# Patient Record
Sex: Female | Born: 1935 | Race: Black or African American | Hispanic: No | Smoking: Former smoker
Health system: Southern US, Community
[De-identification: ages and names within clinical notes are randomized; demographics above are authoritative.]

## PROBLEM LIST (undated history)

## (undated) DIAGNOSIS — E039 Hypothyroidism, unspecified: Secondary | ICD-10-CM

## (undated) DIAGNOSIS — I469 Cardiac arrest, cause unspecified: Secondary | ICD-10-CM

## (undated) DIAGNOSIS — I509 Heart failure, unspecified: Secondary | ICD-10-CM

## (undated) DIAGNOSIS — I219 Acute myocardial infarction, unspecified: Secondary | ICD-10-CM

## (undated) DIAGNOSIS — E785 Hyperlipidemia, unspecified: Secondary | ICD-10-CM

## (undated) DIAGNOSIS — K219 Gastro-esophageal reflux disease without esophagitis: Secondary | ICD-10-CM

## (undated) DIAGNOSIS — I4891 Unspecified atrial fibrillation: Secondary | ICD-10-CM

## (undated) DIAGNOSIS — N289 Disorder of kidney and ureter, unspecified: Secondary | ICD-10-CM

## (undated) DIAGNOSIS — I272 Pulmonary hypertension, unspecified: Secondary | ICD-10-CM

## (undated) DIAGNOSIS — J449 Chronic obstructive pulmonary disease, unspecified: Secondary | ICD-10-CM

## (undated) DIAGNOSIS — I10 Essential (primary) hypertension: Secondary | ICD-10-CM

## (undated) DIAGNOSIS — Z8541 Personal history of malignant neoplasm of cervix uteri: Secondary | ICD-10-CM

## (undated) DIAGNOSIS — N1330 Unspecified hydronephrosis: Secondary | ICD-10-CM

## (undated) DIAGNOSIS — K529 Noninfective gastroenteritis and colitis, unspecified: Secondary | ICD-10-CM

## (undated) DIAGNOSIS — I739 Peripheral vascular disease, unspecified: Secondary | ICD-10-CM

## (undated) HISTORY — DX: Hypothyroidism, unspecified: E03.9

## (undated) HISTORY — DX: Gastro-esophageal reflux disease without esophagitis: K21.9

## (undated) HISTORY — DX: Pulmonary hypertension, unspecified: I27.20

## (undated) HISTORY — DX: Noninfective gastroenteritis and colitis, unspecified: K52.9

## (undated) HISTORY — PX: OTHER SURGICAL HISTORY: SHX169

## (undated) HISTORY — DX: Hyperlipidemia, unspecified: E78.5

## (undated) HISTORY — PX: PERIPHERALLY INSERTED CENTRAL CATHETER INSERTION: SHX2221

## (undated) HISTORY — DX: Cardiac arrest, cause unspecified: I46.9

## (undated) HISTORY — DX: Personal history of malignant neoplasm of cervix uteri: Z85.41

## (undated) HISTORY — DX: Acute myocardial infarction, unspecified: I21.9

## (undated) HISTORY — DX: Peripheral vascular disease, unspecified: I73.9

## (undated) HISTORY — DX: Unspecified hydronephrosis: N13.30

---

## 2005-02-13 ENCOUNTER — Ambulatory Visit: Payer: Self-pay | Admitting: Cardiology

## 2008-12-06 ENCOUNTER — Ambulatory Visit: Payer: Self-pay | Admitting: Surgery

## 2008-12-27 ENCOUNTER — Ambulatory Visit: Payer: Self-pay | Admitting: Surgery

## 2009-01-04 ENCOUNTER — Ambulatory Visit (HOSPITAL_COMMUNITY): Admission: RE | Admit: 2009-01-04 | Discharge: 2009-01-04 | Payer: Self-pay | Admitting: Surgery

## 2009-01-04 ENCOUNTER — Ambulatory Visit: Payer: Self-pay | Admitting: Surgery

## 2009-01-17 ENCOUNTER — Ambulatory Visit: Payer: Self-pay | Admitting: Surgery

## 2009-01-17 ENCOUNTER — Encounter: Admission: RE | Admit: 2009-01-17 | Discharge: 2009-01-17 | Payer: Self-pay | Admitting: Surgery

## 2009-02-04 ENCOUNTER — Ambulatory Visit: Payer: Self-pay | Admitting: Surgery

## 2009-02-04 ENCOUNTER — Inpatient Hospital Stay (HOSPITAL_COMMUNITY): Admission: RE | Admit: 2009-02-04 | Discharge: 2009-02-11 | Payer: Self-pay | Admitting: Surgery

## 2009-02-04 HISTORY — PX: PR VEIN BYPASS GRAFT,AORTO-FEM-POP: 35551

## 2009-03-21 ENCOUNTER — Ambulatory Visit: Payer: Self-pay | Admitting: Surgery

## 2009-05-09 ENCOUNTER — Ambulatory Visit: Payer: Self-pay | Admitting: Surgery

## 2010-07-20 LAB — GLUCOSE, CAPILLARY
Glucose-Capillary: 106 mg/dL — ABNORMAL HIGH (ref 70–99)
Glucose-Capillary: 111 mg/dL — ABNORMAL HIGH (ref 70–99)
Glucose-Capillary: 112 mg/dL — ABNORMAL HIGH (ref 70–99)
Glucose-Capillary: 115 mg/dL — ABNORMAL HIGH (ref 70–99)
Glucose-Capillary: 121 mg/dL — ABNORMAL HIGH (ref 70–99)
Glucose-Capillary: 132 mg/dL — ABNORMAL HIGH (ref 70–99)
Glucose-Capillary: 239 mg/dL — ABNORMAL HIGH (ref 70–99)
Glucose-Capillary: 89 mg/dL (ref 70–99)

## 2010-07-20 LAB — COMPREHENSIVE METABOLIC PANEL
AST: 28 U/L (ref 0–37)
Albumin: 2.4 g/dL — ABNORMAL LOW (ref 3.5–5.2)
Albumin: 3.9 g/dL (ref 3.5–5.2)
Alkaline Phosphatase: 60 U/L (ref 39–117)
Chloride: 105 mEq/L (ref 96–112)
Chloride: 110 mEq/L (ref 96–112)
Creatinine, Ser: 1.04 mg/dL (ref 0.4–1.2)
GFR calc Af Amer: >60 mL/min (ref >60)
GFR calc non Af Amer: 52 mL/min — ABNORMAL LOW (ref >60)
Potassium: 4.3 mEq/L (ref 3.5–5.1)
Total Bilirubin: 0.3 mg/dL (ref 0.3–1.2)
Total Protein: 4.8 g/dL — ABNORMAL LOW (ref 6.0–8.3)
Total Protein: 7.4 g/dL (ref 6.0–8.3)

## 2010-07-20 LAB — BLOOD GAS, ARTERIAL
Acid-base deficit: 5 mmol/L — ABNORMAL HIGH (ref 0.0–2.0)
Drawn by: 275531
FIO2: 0.21 %
O2 Saturation: 97.9 %
O2 Saturation: 98.3 %
Patient temperature: 98.6
Patient temperature: 98.6
TCO2: 20.3 mmol/L (ref 0–100)
TCO2: 24.3 mmol/L (ref 0–100)
pCO2 arterial: 57.6 mmHg (ref 35.0–45.0)
pH, Arterial: 7.216 — ABNORMAL LOW (ref 7.350–7.400)
pH, Arterial: 7.377 (ref 7.350–7.400)
pO2, Arterial: 124 mmHg — ABNORMAL HIGH (ref 80.0–100.0)

## 2010-07-20 LAB — TYPE AND SCREEN
ABO/RH(D): AB POS
Antibody Screen: NEGATIVE

## 2010-07-20 LAB — BASIC METABOLIC PANEL
BUN: 6 mg/dL (ref 6–23)
Calcium: 7.9 mg/dL — ABNORMAL LOW (ref 8.4–10.5)
Chloride: 104 mEq/L (ref 96–112)
Chloride: 99 mEq/L (ref 96–112)
Creatinine, Ser: 0.85 mg/dL (ref 0.4–1.2)
Creatinine, Ser: 0.86 mg/dL (ref 0.4–1.2)
GFR calc Af Amer: >60 mL/min (ref >60)
GFR calc Af Amer: >60 mL/min (ref >60)
GFR calc non Af Amer: >60 mL/min (ref >60)
Glucose, Bld: 132 mg/dL — ABNORMAL HIGH (ref 70–99)
Potassium: 3.4 mEq/L — ABNORMAL LOW (ref 3.5–5.1)
Potassium: 3.6 mEq/L (ref 3.5–5.1)
Potassium: 4 mEq/L (ref 3.5–5.1)
Sodium: 127 mEq/L — ABNORMAL LOW (ref 135–145)
Sodium: 138 mEq/L (ref 135–145)

## 2010-07-20 LAB — CBC
HCT: 25.7 % — ABNORMAL LOW (ref 36.0–46.0)
HCT: 27.5 % — ABNORMAL LOW (ref 36.0–46.0)
HCT: 27.7 % — ABNORMAL LOW (ref 36.0–46.0)
Hemoglobin: 14.4 g/dL (ref 12.0–15.0)
Hemoglobin: 8.9 g/dL — ABNORMAL LOW (ref 12.0–15.0)
Hemoglobin: 9.6 g/dL — ABNORMAL LOW (ref 12.0–15.0)
MCHC: 34.5 g/dL (ref 30.0–36.0)
MCV: 94.9 fL (ref 78.0–100.0)
MCV: 95.3 fL (ref 78.0–100.0)
MCV: 96.1 fL (ref 78.0–100.0)
Platelets: 177 10*3/uL (ref 150–400)
Platelets: 187 10*3/uL (ref 150–400)
Platelets: 187 10*3/uL (ref 150–400)
Platelets: 251 10*3/uL (ref 150–400)
RBC: 2.69 MIL/uL — ABNORMAL LOW (ref 3.87–5.11)
RBC: 2.84 MIL/uL — ABNORMAL LOW (ref 3.87–5.11)
RDW: 14.4 % (ref 11.5–15.5)
WBC: 11.3 10*3/uL — ABNORMAL HIGH (ref 4.0–10.5)
WBC: 6.9 10*3/uL (ref 4.0–10.5)
WBC: 9.4 10*3/uL (ref 4.0–10.5)

## 2010-07-20 LAB — URINALYSIS, ROUTINE W REFLEX MICROSCOPIC
Glucose, UA: NEGATIVE mg/dL
Hgb urine dipstick: NEGATIVE
Nitrite: NEGATIVE
Protein, ur: NEGATIVE mg/dL
Urobilinogen, UA: 0.2 mg/dL (ref 0.0–1.0)
pH: 5.5 (ref 5.0–8.0)

## 2010-07-20 LAB — MAGNESIUM: Magnesium: 1.2 mg/dL — ABNORMAL LOW (ref 1.5–2.5)

## 2010-07-20 LAB — URINE MICROSCOPIC-ADD ON

## 2010-07-20 LAB — PROTIME-INR: INR: 1.06 (ref 0.00–1.49)

## 2010-07-20 LAB — MRSA PCR SCREENING: MRSA by PCR: NEGATIVE

## 2010-07-21 LAB — POCT I-STAT, CHEM 8
BUN: 17 mg/dL (ref 6–23)
Chloride: 110 mEq/L (ref 96–112)
Creatinine, Ser: 0.9 mg/dL (ref 0.4–1.2)
Glucose, Bld: 159 mg/dL — ABNORMAL HIGH (ref 70–99)
Hemoglobin: 14.3 g/dL (ref 12.0–15.0)
Potassium: 3.6 mEq/L (ref 3.5–5.1)
Sodium: 141 mEq/L (ref 135–145)

## 2010-08-29 NOTE — Assessment & Plan Note (Signed)
OFFICE VISIT   CARMALITA, WAKEFIELD  DOB:  1933/03/11                                       05/09/2009  ZOXWR#:[id number]   The patient returns today having undergone aortobifemoral bypass graft  on 02/04/2009.  This was done for lifestyle-limiting claudication.  Her  claudication has since resolved.  However, she does complain of some  burning in her right thigh and pain at the superior and inferior aspects  of her incision.  She is walking without difficulty.  She has normal GI  function and normal appetite.   PHYSICAL EXAMINATION:  Blood pressure is 154/87, heart rate 92,  temperature is 97.6.  General:  She is well-appearing, in no distress.  HEENT:  Normal.  Cardiovascular:  Regular rate and rhythm.  Abdomen:  Soft, nontender.  She has a well-healed abdominal incision.  No hernias  are appreciated.  She has palpable pedal pulses,   ASSESSMENT:  Status post aortobifemoral bypass graft.   PLAN:  I think most of the patient's symptoms are related to typical  postoperative complaints, most likely attributed to scar tissue.  I told  her that I would expect her symptoms to resolve within the next 3-6  months.  If she has any further questions or concerns, she will contact  me.  Otherwise I will see her at her regularly scheduled appointment.     Jorge Ny, MD  Electronically Signed   VWB/MEDQ  D:  05/09/2009  T:  05/10/2009  Job:  [id number]

## 2010-08-29 NOTE — Assessment & Plan Note (Signed)
OFFICE VISIT   SHIRLINE, KENDLE A  DOB:  26-Jul-1932                                       03/21/2009  ZOXWR#:[id number]   REASON FOR VISIT:  Followup.   A 77 year old female who underwent aortobifemoral bypass graft on  02/04/2009.  She has been doing very well.  She is not having any  claudication symptoms.  Her bowels have returned to normal function.  She is no longer taking pain medicine.  The patient will be placed on a  P-scan protocol.  I will see her back in a year.   Jorge Ny, MD  Electronically Signed   VWB/MEDQ  D:  03/21/2009  T:  03/22/2009  Job:  [id number]

## 2010-08-29 NOTE — Assessment & Plan Note (Signed)
OFFICE VISIT   DONICIA, DRUCK ANN  DOB:  Aug 20, 1932                                       12/27/2008  UEAVW#:[id number]   REASON FOR VISIT:  Follow up claudication.   HISTORY:  This is a 77 year old female that I saw approximately 3 or 4  weeks ago in evaluation of claudication right leg being greater than the  left that has been bothering her for a year.  She has an ankle brachial  index of 0.48 on the right and 0.82 on the left.  There was suggestion  of inflow disease.  She can hardly get around the house at this time now  so this is lifestyle limiting.  I tried her on cilostazol, however, she  had minimal benefit.  She comes in today for further evaluation.   PHYSICAL EXAMINATION:  Blood pressure 131/84, pulse 102.  She is well-  appearing in no distress.  Legs are warm and well-perfused.  There is no  ulceration.   ASSESSMENT AND PLAN:  The patient will be scheduled for an aortogram  with bilateral runoff.  I will plan on accessing the left leg but may  require right leg access as well to treat inflow disease.  The patient  does have a dye allergy which was many, many years ago and consisted of  temporary blindness.  She has had dye since that time and has not had a  problem.  I plan on giving her steroids to premedicate her.  She has an  allergy to Benadryl so she will not get Benadryl.  I have discussed the  potential complications including the risk of bleeding, the risk of  embolization, and the risk of this not completely alleviating her  problems.  She understands this and is eager to proceed.  I plan on  doing her procedure  on Tuesday, September 21.   Jorge Ny, MD  Electronically Signed   VWB/MEDQ  D:  12/27/2008  T:  12/28/2008  Job:  [id number]   cc:   Lia Hopping

## 2010-08-29 NOTE — Assessment & Plan Note (Signed)
OFFICE VISIT   Kelly Hart, Kelly Hart  DOB:  December 29, 1932                                       01/17/2009  ZOXWR#:[id number]   REASON FOR VISIT:  Follow-up claudication.   HISTORY:  This is a 77 year old female that I am following for right leg  greater than left claudication.  She has ankle brachial index of 0.48 on  the right and 0.82 on the left. This is now lifestyle limiting to her.  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  I then sent her for a CT scan to make sure she is a  good candidate for a bifemoral bypass graft.  She comes in today for  further evaluation.  She has had no significant change in her symptoms.  Her daily activity is significantly limited by her claudication.   PAST MEDICAL HISTORY:  Is hypertension, hypercholesterolemia, history of  MI at age 53, cervical cancer, gastroesophageal reflux disease.   PAST SURGICAL HISTORY:  Bilateral bunion surgery.   SOCIAL HISTORY:  She is single, retired.  Smokes half pack a day.  Does  not drink alcohol.   MEDICATIONS:  Levothyroxine, Benicar, metoprolol, sotalol, omeprazole,  pravastatin, amlodipine, gemfibrozil and aspirin.   ALLERGIES:  Are penicillin and contrast.   Vital signs:  Blood pressure is 147/84, pulses 101.  She is well-  appearing.  She is in no acute distress.  Cardiovascular:  Regular rate  and rhythm.  Abdomen:  Soft.  Extremities are warm and well-perfused.   The patient has been cleared from a cardiac perspective for operation by  Dr. Phillip Heal. She had a normal exercise stress Cardiolite with no  evidence of myocardium at ischemic risk or myocardial scar.   ASSESSMENT/PLAN:  The patient has elected to proceed with aortobifemoral  bypass graft.  Risks and benefits of the procedure were discussed with  the patient including risk of cardiac issues, pulmonary issues, risk of  dying and risk infection.  She understands all these  and wished to  proceed as soon as possible.  We also had an extensive conversation  regarding smoking cessation.  I have scheduled her surgery for Friday,  October 22.  Based on CT scan she has extensive common femoral disease,  this would necessitate vertical incisions in her groins.   Kelly Ny, MD  Electronically Signed   VWB/MEDQ  D:  01/17/2009  T:  01/18/2009  Job:  [id number]   cc:   Lia Hopping

## 2010-08-29 NOTE — Assessment & Plan Note (Signed)
OFFICE VISIT   Kelly Hart, Kelly Hart  DOB:  [birth year]                                       12/06/2008  ZOXWR#:[id number]   REASON FOR VISIT:  Claudication.   REFERRING PHYSICIAN:  Dr. Lia Hopping   HISTORY:  This is 77 year old female seen at request of Dr. Olena Leatherwood for  evaluation of claudication.  The patient states she had been having  problems in her right leg for approximately 1 year.  She recently  underwent a duplex ultrasound which reveals an ankle brachial index of  0.48 on the right and 0.82 on the left.  There is suggestion of inflow  disease.  The patient states she walks approximately 100 feet and she  begins to get cramping and pain in her right calf.  Her left calf  bothers her as well but not as bad.  She also describes some buttock  pain more prominent on the left side.  Again this has been going on for  a year but has gotten worse.  Her pain is alleviated with rest, she  states that she is wearing her couch out.  She denies having any ulcers  or nonhealing wounds.  She denies having any evidence of rest pain.   She states that she has had a heart attack approximately at age 53 she  is a smoker but is in the process of trying to quit.  She suffers from  hypertension, hypercholesterolemia, both of which are medically managed.   REVIEW OF SYSTEMS:  GENERAL:  Positive for weight loss, loss of  appetite.  CARDIAC:  Positive for heart murmur.  PULMONARY:  Negative.  GI:  Positive reflux.  GU:  Negative.  VASCULAR:  Pain in legs with walking.  NEURO:  Negative.  ORTHO:  Negative.  PSYCH:  Negative.  ENT:  Negative.  HEME:  Negative.   PAST MEDICAL HISTORY:  Hypertension, hypercholesterolemia, history of  MI, age 33, cervical cancer, gastroesophageal reflux disease.   PAST SURGICAL HISTORY:  Bunion surgery bilateral feet.   SOCIAL HISTORY:  She is single.  She is retired.  She currently smokes  half pack a day.  Does not drink  alcohol.   MEDICATIONS:  1. Levothyroxine.  2. Benicar 40 mg per day.  3. Metoprolol 25 mg per day.  4. Sotalol 80 mg per day.  5. Omeprazole 20 mg per day.  6. Pravastatin 10 mg per day.  7. Amlodipine 10 mg per day.  8. Gemfibrozil 600 mg twice a day.   ALLERGIES:  PENICILLIN and IV DYE.   PHYSICAL EXAMINATION:  General:  Well-appearing in no acute distress.  HEENT:  Normocephalic, atraumatic.  Pupils equal.  Sclerae anicteric.  Neck is supple.  No JVD.  Cardiovascular:  Regular rate and rhythm.  Positive systolic ejection murmur.  Pulmonary:  Lungs clear bilaterally.  Abdomen:  Soft, nontender.  Extremities:  Femoral pulses are palpable 2+  on the left and 1+ on the right.  Pedal pulses not palpable.  No  ulcerations.  Neuro:  Cranial II are grossly intact.  Psych:  Alert and  oriented x3.   ASSESSMENT/PLAN:  Bilateral claudication right greater than left.   PLAN:  Discussed with the patient this is not a limb threatening  situation at this time, we have decided to begin with medical therapy.  I am going to  start her on cilostazol 100 mg twice a day.  She is going  to try this medicine for 2-3 weeks and come back to see if she has any  benefit.  If not she will need undergo arteriogram.  She does report of  allergy to IV dye.  I will need to further evaluate this at her next  visit.  I will see her back in 2-3 weeks.   Jorge Ny, MD  Electronically Signed   VWB/MEDQ  D:  12/06/2008  T:  12/07/2008  Job:  [id number]   cc:   Dr. Olena Leatherwood

## 2011-01-15 DIAGNOSIS — I469 Cardiac arrest, cause unspecified: Secondary | ICD-10-CM

## 2011-01-15 HISTORY — DX: Cardiac arrest, cause unspecified: I46.9

## 2011-01-15 HISTORY — PX: OTHER SURGICAL HISTORY: SHX169

## 2011-02-02 ENCOUNTER — Encounter (HOSPITAL_COMMUNITY): Payer: Self-pay

## 2011-02-02 ENCOUNTER — Inpatient Hospital Stay (HOSPITAL_COMMUNITY)
Admission: EM | Admit: 2011-02-02 | Discharge: 2011-02-15 | DRG: 252 | Disposition: A | Discharge status: Home Health | Payer: PRIVATE HEALTH INSURANCE | Source: Other Acute Inpatient Hospital | Attending: Internal Medicine | Admitting: Internal Medicine

## 2011-02-02 ENCOUNTER — Inpatient Hospital Stay (HOSPITAL_COMMUNITY): Payer: PRIVATE HEALTH INSURANCE

## 2011-02-02 DIAGNOSIS — I498 Other specified cardiac arrhythmias: Secondary | ICD-10-CM | POA: Diagnosis not present

## 2011-02-02 DIAGNOSIS — Z87891 Personal history of nicotine dependence: Secondary | ICD-10-CM

## 2011-02-02 DIAGNOSIS — Z515 Encounter for palliative care: Secondary | ICD-10-CM

## 2011-02-02 DIAGNOSIS — I469 Cardiac arrest, cause unspecified: Secondary | ICD-10-CM | POA: Diagnosis present

## 2011-02-02 DIAGNOSIS — R197 Diarrhea, unspecified: Secondary | ICD-10-CM | POA: Diagnosis present

## 2011-02-02 DIAGNOSIS — J9620 Acute and chronic respiratory failure, unspecified whether with hypoxia or hypercapnia: Secondary | ICD-10-CM | POA: Diagnosis present

## 2011-02-02 DIAGNOSIS — J4489 Other specified chronic obstructive pulmonary disease: Secondary | ICD-10-CM | POA: Diagnosis present

## 2011-02-02 DIAGNOSIS — N184 Chronic kidney disease, stage 4 (severe): Secondary | ICD-10-CM | POA: Diagnosis present

## 2011-02-02 DIAGNOSIS — I5033 Acute on chronic diastolic (congestive) heart failure: Principal | ICD-10-CM | POA: Diagnosis present

## 2011-02-02 DIAGNOSIS — I2510 Atherosclerotic heart disease of native coronary artery without angina pectoris: Secondary | ICD-10-CM

## 2011-02-02 DIAGNOSIS — E039 Hypothyroidism, unspecified: Secondary | ICD-10-CM | POA: Diagnosis present

## 2011-02-02 DIAGNOSIS — R570 Cardiogenic shock: Secondary | ICD-10-CM

## 2011-02-02 DIAGNOSIS — I2789 Other specified pulmonary heart diseases: Secondary | ICD-10-CM | POA: Diagnosis present

## 2011-02-02 DIAGNOSIS — I739 Peripheral vascular disease, unspecified: Secondary | ICD-10-CM | POA: Diagnosis present

## 2011-02-02 DIAGNOSIS — I509 Heart failure, unspecified: Secondary | ICD-10-CM | POA: Diagnosis present

## 2011-02-02 DIAGNOSIS — E785 Hyperlipidemia, unspecified: Secondary | ICD-10-CM | POA: Diagnosis present

## 2011-02-02 DIAGNOSIS — I129 Hypertensive chronic kidney disease with stage 1 through stage 4 chronic kidney disease, or unspecified chronic kidney disease: Secondary | ICD-10-CM | POA: Diagnosis present

## 2011-02-02 DIAGNOSIS — Z8541 Personal history of malignant neoplasm of cervix uteri: Secondary | ICD-10-CM

## 2011-02-02 DIAGNOSIS — I279 Pulmonary heart disease, unspecified: Secondary | ICD-10-CM

## 2011-02-02 DIAGNOSIS — I4891 Unspecified atrial fibrillation: Secondary | ICD-10-CM | POA: Diagnosis present

## 2011-02-02 DIAGNOSIS — R634 Abnormal weight loss: Secondary | ICD-10-CM | POA: Diagnosis present

## 2011-02-02 DIAGNOSIS — D649 Anemia, unspecified: Secondary | ICD-10-CM | POA: Diagnosis present

## 2011-02-02 DIAGNOSIS — J449 Chronic obstructive pulmonary disease, unspecified: Secondary | ICD-10-CM | POA: Diagnosis present

## 2011-02-02 HISTORY — DX: Unspecified atrial fibrillation: I48.91

## 2011-02-02 HISTORY — DX: Heart failure, unspecified: I50.9

## 2011-02-02 HISTORY — DX: Essential (primary) hypertension: I10

## 2011-02-02 HISTORY — DX: Disorder of kidney and ureter, unspecified: N28.9

## 2011-02-02 HISTORY — DX: Chronic obstructive pulmonary disease, unspecified: J44.9

## 2011-02-02 LAB — POCT I-STAT 3, VENOUS BLOOD GAS (G3P V)
Acid-Base Excess: 8 mmol/L — ABNORMAL HIGH (ref 0.0–2.0)
Bicarbonate: 33.6 mEq/L — ABNORMAL HIGH (ref 20.0–24.0)
O2 Saturation: 39 %
TCO2: 34 mmol/L (ref 0–100)
pCO2, Ven: 49 mmHg (ref 45.0–50.0)
pCO2, Ven: 49 mmHg (ref 45.0–50.0)
pH, Ven: 7.445 — ABNORMAL HIGH (ref 7.250–7.300)
pO2, Ven: 22 mmHg — CL (ref 30.0–45.0)

## 2011-02-02 LAB — POCT ACTIVATED CLOTTING TIME: Activated Clotting Time: 127 seconds

## 2011-02-02 LAB — POCT I-STAT 3, ART BLOOD GAS (G3+)
Bicarbonate: 31.4 mEq/L — ABNORMAL HIGH (ref 20.0–24.0)
O2 Saturation: 95 %
pCO2 arterial: 38.8 mmHg (ref 35.0–45.0)
pO2, Arterial: 68 mmHg — ABNORMAL LOW (ref 80.0–100.0)

## 2011-02-03 ENCOUNTER — Inpatient Hospital Stay (HOSPITAL_COMMUNITY): Payer: PRIVATE HEALTH INSURANCE

## 2011-02-03 LAB — POCT I-STAT 3, ART BLOOD GAS (G3+)
pCO2 arterial: 44.2 mmHg (ref 35.0–45.0)
pH, Arterial: 7.491 — ABNORMAL HIGH (ref 7.350–7.400)
pO2, Arterial: 147 mmHg — ABNORMAL HIGH (ref 80.0–100.0)

## 2011-02-03 LAB — COMPREHENSIVE METABOLIC PANEL
Albumin: 2.7 g/dL — ABNORMAL LOW (ref 3.5–5.2)
Alkaline Phosphatase: 121 U/L — ABNORMAL HIGH (ref 39–117)
BUN: 30 mg/dL — ABNORMAL HIGH (ref 6–23)
Calcium: 7 mg/dL — ABNORMAL LOW (ref 8.4–10.5)
GFR calc Af Amer: 31 mL/min — ABNORMAL LOW (ref >90)
Glucose, Bld: 143 mg/dL — ABNORMAL HIGH (ref 70–99)
Potassium: 3.2 mEq/L — ABNORMAL LOW (ref 3.5–5.1)
Sodium: 141 mEq/L (ref 135–145)
Total Protein: 5.4 g/dL — ABNORMAL LOW (ref 6.0–8.3)

## 2011-02-03 LAB — TSH: TSH: 0.427 u[IU]/mL (ref 0.350–4.500)

## 2011-02-03 LAB — CARBOXYHEMOGLOBIN
Carboxyhemoglobin: 1.1 % (ref 0.5–1.5)
O2 Saturation: 42.1 %
Total hemoglobin: 10.2 g/dL — ABNORMAL LOW (ref 12.5–16.0)

## 2011-02-03 LAB — DIFFERENTIAL
Basophils Absolute: 0 10*3/uL (ref 0.0–0.1)
Basophils Relative: 0 % (ref 0–1)
Eosinophils Relative: 0 % (ref 0–5)
Monocytes Absolute: 0.2 10*3/uL (ref 0.1–1.0)
Monocytes Relative: 3 % (ref 3–12)

## 2011-02-03 LAB — CBC
MCH: 31.2 pg (ref 26.0–34.0)
MCHC: 34 g/dL (ref 30.0–36.0)
RDW: 16.9 % — ABNORMAL HIGH (ref 11.5–15.5)

## 2011-02-03 LAB — MAGNESIUM: Magnesium: 1.1 mg/dL — ABNORMAL LOW (ref 1.5–2.5)

## 2011-02-04 DIAGNOSIS — I369 Nonrheumatic tricuspid valve disorder, unspecified: Secondary | ICD-10-CM

## 2011-02-04 LAB — BASIC METABOLIC PANEL WITH GFR
BUN: 31 mg/dL — ABNORMAL HIGH (ref 6–23)
CO2: 30 meq/L (ref 19–32)
Calcium: 8.1 mg/dL — ABNORMAL LOW (ref 8.4–10.5)
Chloride: 101 meq/L (ref 96–112)
Creatinine, Ser: 1.62 mg/dL — ABNORMAL HIGH (ref 0.50–1.10)
GFR calc Af Amer: 34 mL/min — ABNORMAL LOW
GFR calc non Af Amer: 30 mL/min — ABNORMAL LOW
Glucose, Bld: 118 mg/dL — ABNORMAL HIGH (ref 70–99)
Potassium: 4.2 meq/L (ref 3.5–5.1)
Sodium: 139 meq/L (ref 135–145)

## 2011-02-04 LAB — BASIC METABOLIC PANEL
Calcium: 6.8 mg/dL — ABNORMAL LOW (ref 8.4–10.5)
GFR calc Af Amer: 76 mL/min — ABNORMAL LOW (ref >90)
GFR calc non Af Amer: 66 mL/min — ABNORMAL LOW (ref >90)
Glucose, Bld: 137 mg/dL — ABNORMAL HIGH (ref 70–99)
Potassium: 3 mEq/L — ABNORMAL LOW (ref 3.5–5.1)
Sodium: 139 mEq/L (ref 135–145)

## 2011-02-04 LAB — MAGNESIUM: Magnesium: 2 mg/dL (ref 1.5–2.5)

## 2011-02-05 LAB — BASIC METABOLIC PANEL
BUN: 29 mg/dL — ABNORMAL HIGH (ref 6–23)
Chloride: 105 mEq/L (ref 96–112)
GFR calc Af Amer: 35 mL/min — ABNORMAL LOW (ref >90)
GFR calc non Af Amer: 30 mL/min — ABNORMAL LOW (ref >90)
Potassium: 4.4 mEq/L (ref 3.5–5.1)
Sodium: 140 mEq/L (ref 135–145)

## 2011-02-05 LAB — CARBOXYHEMOGLOBIN
Carboxyhemoglobin: 1.4 % (ref 0.5–1.5)
Methemoglobin: 0.4 % (ref 0.0–1.5)
O2 Saturation: 63.1 %
Total hemoglobin: 9.6 g/dL — ABNORMAL LOW (ref 12.5–16.0)

## 2011-02-06 DIAGNOSIS — I739 Peripheral vascular disease, unspecified: Secondary | ICD-10-CM

## 2011-02-06 LAB — CARBOXYHEMOGLOBIN: Carboxyhemoglobin: 1.5 % (ref 0.5–1.5)

## 2011-02-06 LAB — BASIC METABOLIC PANEL
Chloride: 102 mEq/L (ref 96–112)
GFR calc non Af Amer: 35 mL/min — ABNORMAL LOW (ref >90)
Glucose, Bld: 114 mg/dL — ABNORMAL HIGH (ref 70–99)
Potassium: 3.8 mEq/L (ref 3.5–5.1)
Sodium: 140 mEq/L (ref 135–145)

## 2011-02-07 LAB — BASIC METABOLIC PANEL
Chloride: 102 mEq/L (ref 96–112)
GFR calc Af Amer: 46 mL/min — ABNORMAL LOW (ref >90)
GFR calc non Af Amer: 40 mL/min — ABNORMAL LOW (ref >90)
Glucose, Bld: 147 mg/dL — ABNORMAL HIGH (ref 70–99)
Potassium: 3.5 mEq/L (ref 3.5–5.1)
Sodium: 141 mEq/L (ref 135–145)

## 2011-02-07 LAB — CARBOXYHEMOGLOBIN
Carboxyhemoglobin: 1.3 % (ref 0.5–1.5)
Methemoglobin: 0.6 % (ref 0.0–1.5)
O2 Saturation: 51.6 %

## 2011-02-07 LAB — GLUCOSE, CAPILLARY: Glucose-Capillary: 115 mg/dL — ABNORMAL HIGH (ref 70–99)

## 2011-02-08 DIAGNOSIS — I279 Pulmonary heart disease, unspecified: Secondary | ICD-10-CM

## 2011-02-08 LAB — POCT I-STAT 3, VENOUS BLOOD GAS (G3P V)
Bicarbonate: 24 mEq/L (ref 20.0–24.0)
O2 Saturation: 65 %
TCO2: 26 mmol/L (ref 0–100)
pCO2, Ven: 37.2 mmHg — ABNORMAL LOW (ref 45.0–50.0)
pH, Ven: 7.435 — ABNORMAL HIGH (ref 7.250–7.300)
pO2, Ven: 32 mmHg (ref 30.0–45.0)
pO2, Ven: 33 mmHg (ref 30.0–45.0)

## 2011-02-08 LAB — PROTIME-INR: Prothrombin Time: 15.5 seconds — ABNORMAL HIGH (ref 11.6–15.2)

## 2011-02-08 LAB — TYPE AND SCREEN: ABO/RH(D): AB POS

## 2011-02-08 LAB — POCT I-STAT 3, ART BLOOD GAS (G3+)
Bicarbonate: 26.9 mEq/L — ABNORMAL HIGH (ref 20.0–24.0)
TCO2: 28 mmol/L (ref 0–100)
pH, Arterial: 7.477 — ABNORMAL HIGH (ref 7.350–7.400)
pO2, Arterial: 70 mmHg — ABNORMAL LOW (ref 80.0–100.0)

## 2011-02-08 LAB — BASIC METABOLIC PANEL
BUN: 22 mg/dL (ref 6–23)
Chloride: 102 mEq/L (ref 96–112)
Glucose, Bld: 105 mg/dL — ABNORMAL HIGH (ref 70–99)
Potassium: 4.1 mEq/L (ref 3.5–5.1)

## 2011-02-08 LAB — GLUCOSE, CAPILLARY: Glucose-Capillary: 104 mg/dL — ABNORMAL HIGH (ref 70–99)

## 2011-02-08 LAB — CBC
MCH: 30.7 pg (ref 26.0–34.0)
MCHC: 33.1 g/dL (ref 30.0–36.0)
Platelets: 274 10*3/uL (ref 150–400)
RDW: 17.4 % — ABNORMAL HIGH (ref 11.5–15.5)

## 2011-02-09 DIAGNOSIS — I70509 Unspecified atherosclerosis of nonautologous biological bypass graft(s) of the extremities, unspecified extremity: Secondary | ICD-10-CM

## 2011-02-09 DIAGNOSIS — I743 Embolism and thrombosis of arteries of the lower extremities: Secondary | ICD-10-CM

## 2011-02-09 LAB — CBC
MCH: 30.3 pg (ref 26.0–34.0)
MCHC: 32.3 g/dL (ref 30.0–36.0)
MCV: 93.8 fL (ref 78.0–100.0)
Platelets: 246 10*3/uL (ref 150–400)
RBC: 3.07 MIL/uL — ABNORMAL LOW (ref 3.87–5.11)
RDW: 18.1 % — ABNORMAL HIGH (ref 11.5–15.5)

## 2011-02-09 LAB — BASIC METABOLIC PANEL
CO2: 26 mEq/L (ref 19–32)
Calcium: 8.7 mg/dL (ref 8.4–10.5)
Creatinine, Ser: 1.19 mg/dL — ABNORMAL HIGH (ref 0.50–1.10)
GFR calc non Af Amer: 43 mL/min — ABNORMAL LOW (ref >90)

## 2011-02-10 DIAGNOSIS — I5023 Acute on chronic systolic (congestive) heart failure: Secondary | ICD-10-CM

## 2011-02-10 LAB — BASIC METABOLIC PANEL
Calcium: 8.2 mg/dL — ABNORMAL LOW (ref 8.4–10.5)
Chloride: 106 mEq/L (ref 96–112)
Creatinine, Ser: 1.3 mg/dL — ABNORMAL HIGH (ref 0.50–1.10)
GFR calc Af Amer: 45 mL/min — ABNORMAL LOW (ref >90)

## 2011-02-10 LAB — CBC
MCV: 94.4 fL (ref 78.0–100.0)
Platelets: 275 10*3/uL (ref 150–400)
RDW: 18.6 % — ABNORMAL HIGH (ref 11.5–15.5)
WBC: 9.6 10*3/uL (ref 4.0–10.5)

## 2011-02-11 LAB — CBC
HCT: 28.1 % — ABNORMAL LOW (ref 36.0–46.0)
MCHC: 32.4 g/dL (ref 30.0–36.0)
MCV: 94.3 fL (ref 78.0–100.0)
RDW: 18.1 % — ABNORMAL HIGH (ref 11.5–15.5)

## 2011-02-11 LAB — BASIC METABOLIC PANEL
BUN: 21 mg/dL (ref 6–23)
Calcium: 8.2 mg/dL — ABNORMAL LOW (ref 8.4–10.5)
Creatinine, Ser: 1.16 mg/dL — ABNORMAL HIGH (ref 0.50–1.10)
GFR calc Af Amer: 52 mL/min — ABNORMAL LOW (ref >90)
GFR calc non Af Amer: 45 mL/min — ABNORMAL LOW (ref >90)

## 2011-02-12 ENCOUNTER — Telehealth (HOSPITAL_COMMUNITY): Payer: Self-pay | Admitting: *Deleted

## 2011-02-12 LAB — CBC
Platelets: 277 10*3/uL (ref 150–400)
RDW: 17.7 % — ABNORMAL HIGH (ref 11.5–15.5)
WBC: 8.5 10*3/uL (ref 4.0–10.5)

## 2011-02-12 LAB — BASIC METABOLIC PANEL
Calcium: 8.3 mg/dL — ABNORMAL LOW (ref 8.4–10.5)
Creatinine, Ser: 1.15 mg/dL — ABNORMAL HIGH (ref 0.50–1.10)
GFR calc Af Amer: 52 mL/min — ABNORMAL LOW (ref >90)

## 2011-02-12 NOTE — Telephone Encounter (Signed)
[name], NP sent in new rx for 20 mg 1/2 tab daily

## 2011-02-12 NOTE — Telephone Encounter (Signed)
Ms [facility]ter[facility] pharmacy called, the Benay Pillow that was ordered for her has a dosage of 10mg , and it is only available in 20 mg.  They want to know if you want to change it to the 20 mg.

## 2011-02-13 ENCOUNTER — Telehealth (HOSPITAL_COMMUNITY): Payer: Self-pay | Admitting: *Deleted

## 2011-02-13 ENCOUNTER — Encounter: Payer: Self-pay | Admitting: *Deleted

## 2011-02-13 LAB — CARBOXYHEMOGLOBIN
Carboxyhemoglobin: 1.9 % — ABNORMAL HIGH (ref 0.5–1.5)
O2 Saturation: 71.5 %

## 2011-02-13 LAB — BASIC METABOLIC PANEL
BUN: 15 mg/dL (ref 6–23)
Chloride: 104 mEq/L (ref 96–112)
Creatinine, Ser: 1.1 mg/dL (ref 0.50–1.10)
GFR calc Af Amer: 55 mL/min — ABNORMAL LOW (ref >90)
Glucose, Bld: 91 mg/dL (ref 70–99)

## 2011-02-13 NOTE — Telephone Encounter (Signed)
[name], NP called and did PA earlier today

## 2011-02-13 NOTE — Telephone Encounter (Signed)
Kelly Hart from [employer] called this am.  They need verifiacation of a medication that is being administered via an infusion pump by the end of the day tomorrow or they will deny the claim

## 2011-02-14 LAB — BASIC METABOLIC PANEL
CO2: 23 mEq/L (ref 19–32)
Chloride: 105 mEq/L (ref 96–112)
Creatinine, Ser: 1.13 mg/dL — ABNORMAL HIGH (ref 0.50–1.10)
Potassium: 3.9 mEq/L (ref 3.5–5.1)

## 2011-02-14 LAB — CARBOXYHEMOGLOBIN
Methemoglobin: 0.4 % (ref 0.0–1.5)
Methemoglobin: 0.8 % (ref 0.0–1.5)
Total hemoglobin: 2.1 g/dL — CL (ref 12.5–16.0)

## 2011-02-14 NOTE — Op Note (Signed)
  NAMEKELSY, POLACK NO.:  [id number]  [name]  LOCATION:  2918                         FACILITY:  [facility]  PHYSICIAN:  Larina Earthly, [name]    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  PROCEDURE:  Thrombectomy of right limb aortobifemoral bypass and thrombectomy of the superficial femoral artery and profunda femoris artery.  SURGEON:  Larina Earthly, MD  ASSISTANT:  Pecola Leisure, PA  ANESTHESIA:  MAC.  COMPLICATIONS:  None.  DISPOSITION:  To recovery room stable.  PROCEDURE DETAIL:  The patient was taken to the operating room and placed in supine position.  The area of the  left groin and right leg were prepped and draped in sterile fashion.  Incision was made through a prior groin incision and carried down to isolate the right limb of her aortobifemoral bypass.  The limb itself was encircled with a vessel loop.  The native common femoral artery proximal limb was also encircled as was the superficial femoral and 2 large profunda branches.  The patient was given 5000 units of intravenous heparin.  After adequate circulation time, incision was made transversely over the hood of the graft.  Clot was removed from this area and a 4 Fogarty catheter was positioned down to the level of the calf.  There was clot in the superficial femoral artery, and this was removed with good back bleeding and a negative pass was obtained.  This was occluded with a vessel loop. Next, the Fogarty catheter was passed down both large branch of the profunda and good backbleeding was encountered.  These fully occluded. The native common femoral artery was subtotally occluded.  The catheter would only pass  approximately 1.5 to 1 cm with a trickle of back bleeding.  Next, the Fogarty catheter was passed up the right limb of the aortofemoral graft.  Clot was removed and the arterialized plug was removed and excellent inflow was encountered.  This was re-occluded. The anastomosis area was flushed with heparinized saline.  No further thrombus was identified.  The incision of the graft was closed with a running 5-0 Prolene suture.  Clamps were removed and flow was restored back to the lower extremities.  The patient did have a palpable dorsalis pedis pulse on the right.  The patient was given 50 mg of protamine to reverse the heparin.  The wounds were irrigated with saline.  Hemostasis with electrocautery.  Wounds were closed with 2-0 Vicryl in several layers in the subcutaneous tissue, skin was closed with 3-0 subcuticular Vicryl stitch.  Benzoin and Steri-Strips were applied, and the patient was taken to the recovery room in stable condition.     Larina Earthly, [name]     TFE/MEDQ  D:  02/09/2011  T:  02/09/2011  Job:  [id number]  Electronically Signed by Oluwateniola Leitch [name] on 02/14/2011 01:18:20 PM

## 2011-02-15 DIAGNOSIS — I469 Cardiac arrest, cause unspecified: Secondary | ICD-10-CM

## 2011-02-15 LAB — BASIC METABOLIC PANEL
CO2: 22 mEq/L (ref 19–32)
Chloride: 106 mEq/L (ref 96–112)
GFR calc Af Amer: 53 mL/min — ABNORMAL LOW (ref >90)
Potassium: 3.6 mEq/L (ref 3.5–5.1)

## 2011-02-15 MED ORDER — TADALAFIL (PAH) 20 MG PO TABS
10.0000 mg | ORAL_TABLET | Freq: Every day | ORAL | Status: DC
  Filled 2011-02-15 (×4): qty 1

## 2011-02-15 NOTE — Telephone Encounter (Signed)
Discussed with insurance via phone and authorization number provided to Kelly Hart [employer], [employer]tele[employer].

## 2011-02-16 ENCOUNTER — Encounter (HOSPITAL_COMMUNITY): Payer: Medicare Other

## 2011-02-16 NOTE — Cardiovascular Report (Signed)
Kelly Hart, Kelly Hart NO.:  [id number]  [name]  LOCATION:  2918                         FACILITY:  [facility]  PHYSICIAN:  Bevelyn Buckles. Beau Ramsburg, MDDATE OF BIRTH:  10/06/1932  DATE OF PROCEDURE:  02/08/2011 DATE OF DISCHARGE:                           CARDIAC CATHETERIZATION   REFERRING PHYSICIAN:  Demetria Pore. Titus Mould, MD  Kelly Hart is a very pleasant 77 year old woman who was admitted last week with PEA arrest in the setting of pulmonary hypertension and cor pulmonale.  She underwent cardiac catheterization last week which showed moderate pulmonary hypertension with cardiogenic shock.  Her wedge pressure in the left lower pulmonary capillary wedge position was 30 with a LVEDP of 13.  This was suggestive of possible mitral stenosis or PVOD.  Echocardiogram did not reveal any evidence of mitral stenosis. There was a significant RV dysfunction.  Chest CT did not support the diagnosis of PVOD.  She has been treated with milrinone, has done quite nicely.  We brought her back today for further testing to investigate the gradient between her pulmonary capillary wedge pressure and her LVEDP as well as pulmonary angiograms and adenosine vasoreactivity testing.  PROCEDURES PERFORMED: 1. Right heart catheterization. 2. Left heart catheterization. 3. Selective pulmonary angiography x4. 4. Adenosine vasoreactivity challenge.  DESCRIPTION OF PROCEDURE:  The risks and indications were explained. Consent was signed and placed on the chart.  A 5-French venous sheath was placed in the right femoral artery using a modified Seldinger technique and standard angled pigtail was used for the left heart catheterization.  A 7-French venous sheath was placed in the right femoral vein using a modified Seldinger technique and a standard Swan- Ganz was used.  This was a very prolonged and complicated procedure.  We used a long 0.025 wire to serially navigate the  pulmonary artery catheter into the 4 separate pulmonary artery sections.  Given the size of her right heart, manipulation of the catheter was extremely difficult and took multiple attempts in most segments.  Once we were engage in each segment, we took hemodynamic measurements as well as shot selective pulmonary artery angiography.  After this was completed, we then placed the pigtail in the LV and measured the gradients between the pulmonary capillary wedge pressure and the LVEDP on each side.  We then proceeded with adenosine vasoreactivity challenge at 50 mcg per kg per minute for 3 minutes, then 100 mcg per kg per minute for 3 minutes and then 150 mcg per kg per minute.  Of note, the testing was performed on milrinone 0.25 mcg per kg per minute drip that she was on in the ICU.  FINDINGS:  Resting Hemodynamics.  Right atrial pressure mean of 5, RV pressure 59/2 with an EDP of 3, PA pressure 62/24 with a mean of 42. Pulmonary capillary wedge on the right lower lobe was 19-21 mean. Pulmonary capillary wedge on the right upper lobe was mean of 12. Pulmonary capillary wedge on the left lower lobe was mean of 9. Pulmonary capillary wedge on the left upper lobe was 13.  Fick cardiac output was 4.6, cardiac index 3.2.  Pulmonary vascular resistance was 7.2 Woods units.  Femoral artery  saturation was 95%.  PA saturation was 64% and 63%.  Adenosine at 50 mcg per kg per minute.  Pulmonary artery pressure was 61/35 with a mean of 45.  Pulmonary capillary wedge pressure mean of 13. Thermodilution cardiac output was 2.6 L/minute.  This was felt to be underestimated due to her tricuspid regurgitation.  Her heart rate and blood pressure remained stable.  Adenosine 100 mcg per kg per minute.  Pulmonary artery pressure 66/33 with a mean of 45.  Pulmonary capillary wedge mean of 17. Thermodilution was 2.6 L/minute.  Adenosine 150 mcg per kg per minute.  PA pressure was 62/31 with a mean of 44.   Pulmonary capillary wedge mean of 16 with a question of a V-wave between 20 and 25.  Thermodilution cardiac output was 3.1.  Selective pulmonary angiography in all 4 quadrants appear normal with brisk drainage of all 4 pulmonary veins into the left atrium.  There was no sign of pulmonary vein stenosis or pruning of the pulmonary arteries.  ASSESSMENT: 1. Moderate pulmonary arterial hypertension. 2. Pulmonary capillary wedge pressures are normal in the right upper     lobe, left upper lobe, and left lower lobe.  However, in the right     lower lobe the pulmonary capillary wedge pressure is 10 mmHg higher     and has a significant gradient with the left ventricular end-     diastolic pressure which is not present in the other quadrants. 3. Normal cardiac output on milrinone. 4. No significant change in pulmonary pressure with adenosine.  There     was a mild increase in pulmonary capillary wedge pressure with     vasoreactivity. 5. Apparently, normal pulmonary angiography in all 4 quadrants with no     evidence of pulmonary vein stenosis.  PLAN/DISCUSSION:  Kelly Hart cardiac hemodynamics are markedly improved with milrinone with marked improvement in her RV function. There is evidence of a 10-mm gradient in the right lower pulmonary capillary wedge versus the LVEDP.  This is not apparent in the other pulmonary artery segments.  I am not sure of the etiology of this.  I do not see any pulmonary vein stenosis in that segment on the pulmonary angiography.  I suspect that given her improvement with milrinone, we will likely plan discharge home on IV milrinone with possibly PDE-5 inhibitor.  However, I also discussed with the [facility]pert[facility] regarding possible Flolan treatment, but I think milrinone may be the better option.  Total time of the procedure and analyzing data was 3 hours.     Bevelyn Buckles. Midori Dado, MD     DRB/MEDQ  D:  02/08/2011  T:  02/08/2011   Job:  [id number]  Electronically Signed by [name] MD on 02/16/2011 02:10:05 PM

## 2011-02-16 NOTE — Cardiovascular Report (Signed)
Kelly Hart, Kelly Hart NO.:  [id number]  [name]  LOCATION:  2904                         FACILITY:  [facility]  PHYSICIAN:  Bevelyn Buckles. Yen Wandell, MDDATE OF BIRTH:  Nov 10, 1932  DATE OF PROCEDURE:  02/02/2011 DATE OF DISCHARGE:                           CARDIAC CATHETERIZATION   PATIENT IDENTIFICATION:  Kelly Hart is a 77 year old woman with a history of COPD, chronic atrial fibrillation, and peripheral vascular disease status post aortobifemoral bypass grafting.  She denies any history of known heart disease.  She was admitted to [facility] with acute on chronic respiratory distress.  She was treated with Lasix and morphine and developed bradycardic/possible PEA arrest.  She was resuscitated quickly.  Echocardiogram revealed normal LV function with a markedly dilated hypokinetic RV and evidence of significant pulmonary hypertension.  Her cardiac enzymes were minimally positive with a troponin of 0.2.  Her BNP was elevated.  She was for transferred for further evaluation.  PROCEDURES PERFORMED: 1. Right heart catheterization. 2. Left heart catheterization. 3. Selective coronary angiography.  Of note, left ventriculogram was not performed in an effort to minimize contrast exposure.  DESCRIPTION OF PROCEDURE:  The risks and indications were explained to the patient and her family, consent was signed and placed on the chart. Given her known history of contrast allergy, she was premedicated with IV Benadryl, IV Solu-Medrol, and Pepcid.  The right groin area was prepped and draped in routine sterile fashion, anesthetized with 1% local lidocaine.  A 5-French arterial sheath was placed in the right femoral artery and a 7-French venous sheath was placed in the right femoral vein.  Standard catheters including a JL-4, JR-4, and angled pigtail were used.  Swan-Ganz catheter was used for the right heart catheterization.  All catheter exchanges were  made over wire.  No apparent complications.  Of note, we did have significant trouble getting the Swan-Ganz catheter up into the pulmonary artery.  The patient developed chest pain often when the catheter was in the right ventricle.  Using the help of an 0.25 Swan wire, we were able to maneuver the catheter successfully into the pulmonary artery.  We also got a nice wedge tracing.  We then obtained simultaneous RV and LV pressure tracings.  HEMODYNAMICS:  Right atrial pressure mean of 16, RV pressure 55/6 with an EDP of 19, PA pressure 61/30 with a mean of 40.  Pulmonary capillary wedge pressure (clear tracing) equals 30.  Central aortic pressure 107/72 with a mean of 86.  LV pressure 101/9 with an EDP of 13.  Fick cardiac output 2.0 L/minute.  Fick cardiac index was 1.4 L/minute per meter squared.  SVR is 2800.  Pulmonary vascular resistance using the wedge pressure was 5.0 Woods units.  Pulmonary vascular resistance using the LVEDP was 13.5 Woods units.  Atrial valve gradient was 8.6 mmHg. Mitral valve area was 0.9 cm squared. Saturations:  Pulmonary artery saturation was 39%.  Femoral artery saturation was 95% on supplemental oxygen.  CORONARY ANATOMY:  Left main:  Was angiographically normal.  Lad was a moderate-sized vessel, gave off 1 diagonal and had just minimal plaquing.  Left circumflex was also a moderated size vessel, gave off  2 marginal branches.  There was minimal plaquing distally.  Otherwise normal.  Right coronary artery was a large dominant vessel.  It had heavy calcification in the walls of the artery.  There was a 30% tubular lesion in the mid right coronary artery and a 40% lesion in distal PDA. Otherwise, relatively normal.  ASSESSMENT: 1. Minimal nonobstructive coronary artery disease as described above. 2. Normal left ventricular function by outside echo. 3. Moderate pulmonary hypertension with cor pulmonale and cardiogenic     shock. 4. Significant  mitral stenosis by cardiac cath, which is not apparent     on outside echo.  This was discussed with Dr. Tomasita Morrow. 5. Near concordance of LV and RV diastolic pressures. 6. No evidence of ventricular dependence.  PLAN/DISCUSSION:  This is a difficult case.  By catheterization, her main issue appears to be secondary pulmonary hypertension due to severe mitral valve disease; however, outside echo does not support severe mitral stenosis.  This raises the question of possible PVOD (pulmonary vein occlusive disease).  We will repeat her echo here and check a noncontrast CT to further evaluate.  We will start milrinone for support cautiously, she may also need to the dopamine.  Given her markedly elevated wedge pressure, there is no role for selective pulmonary vasodilators.  We will keep her as dry as her BP and RV will tolerate. Prognosis is likely poor.     Bevelyn Buckles. Niang Mitcheltree, MD     DRB/MEDQ  D:  02/02/2011  T:  02/02/2011  Job:  [id number]  cc:   Colon Branch, MD  Electronically Signed by [name] MD on 02/16/2011 02:09:57 PM

## 2011-02-16 NOTE — Discharge Summary (Signed)
Kelly Hart, Kelly Hart NO.:  [id number]  [name]  LOCATION:  2918                         FACILITY:  [facility]  PHYSICIAN:  Bevelyn Buckles. Raja Liska, MDDATE OF BIRTH:  Feb 04, 1933  DATE OF ADMISSION:  02/02/2011 DATE OF DISCHARGE:  02/15/2011                              DISCHARGE SUMMARY   DISCHARGE DIAGNOSES: 1. Pulseless electrical activity arrest. 2. Right heart failure secondary to cor pulmonale. 3. Pulmonary arterial hypertension. 4. Acute-on-chronic respiratory failure, resolved. 5. Multifocal atrial tachycardia. 6. Peripheral arterial disease status post aortobifem bypass with     acute occlusion of the right limb of the graft this admission with     open thrombectomy by Dr. Arbie Cookey. 7. Hyperlipidemia.  HOSPITAL COURSE:  Kelly Hart is a delightful 77 year old woman with a history of COPD and peripheral arterial disease status post aortobifemoral bypass.  She was recently admitted to [facility] for dyspnea and chest pain.  An echocardiogram there revealed a pulmonary hypertension and right heart failure.  She did have a syncopal episode at that time.  She apparently had a V/Q scan and a Cardiolite stress test which were normal.  She was discharged home on January 31, 2011.  She presented to the [facility] for progressive shortness of breath.  She was thought to be volume overloaded and treated with morphine and IV Lasix and developed a PEA arrest.  She was seen by Dr. Titus Mould and Cardiology and transferred here.  On arrival here on February 02, 2011, she was in shock.  We took her to the cath lab.  Cardiac catheterization showed minimal coronary artery disease with 30% in the right coronary artery and 40%in the distal PDA. However, right atrial pressure, we had a mean of 16, a PA pressure of 61/30 with an EDP of 40.  Pulmonary capillary wedge pressure in the right lower lobe pulmonary artery showed a wedge of 30.  LV  pressure was 101/9 with an EDP of 13.  There was significant wedged LVEDP gradient concerning for mitral stenosis.  Her Fick cardiac output was 2.0 and cardiac index was 1.4, this was on low-dose dopamine.  Her PA saturations at that time were 39%.  Based on the results of her cath, she was started on milrinone.  We also did a repeat echo looking for mitral stenosis or PVOD.  This showed normal LV function with severe RV dysfunction and pulmonary hypertension.  There is no evidence of mitral stenosis.  We also did a CT scan of the chest which showed emphysema.  There was no evidence of PVOD.  I reviewed this with Dr. Leanna Battles in Radiology.  She progressed quite nicely with milrinone.  We also diuresed her gently. We still were at loss to explain the difference between her wedge pressure and LVEDP.  I discussed the case with Dr. Marlane Mingle at [facility] and we made a decision to take her back to the cardiac catheterization lab to look for a pulmonary vein stenosis or other etiologies for the gradient.  Repeat cardiac catheterization was performed on February 08, 2011.  We also performed selective pulmonary angiography and adenosine vasoreactivity challenge.  Results  of that catheterization showed a right atrial pressure mean of 7, RV pressure of 59/2 with an EDP of 3, pulmonary artery pressure 62/24 with a mean of 42.  Cardiac output was 4.6 with a cardiac index of 3.2.  Pulmonary vascular resistance was 7.2 woods units.  The right lower lobe wedge pressure was about 20, the right upper lobe was wedge pressure was 12, the left lower lobe wedge pressure was 9, the left upper lobe wedge pressure was 13.  We then did a selective pulmonary angiography in all 4 quadrants.  There appeared to be some mild pruning throughout and perhaps slightly sluggish venous drainage in the right lower lobe but no convincing pulmonary vein stenosis or evidence of PVOD.  She  also underwent adenosine vasoreactivity challenge.  Initially, her wedge was 13, however, with increasing doses of adenosine, wedge bumped up to 16 with prominent V-waves.  Based on this, we were hesitant to start Flolan as a treatment for her pulmonary hypertension due to risk of development of pulmonary edema.  We decided to continue to treat her with milrinone. We did add low-dose Adcirca at 10 mg a day which she tolerated well. Once again, this was discussed with Dr. Monia Pouch in the Pulmonary Hypertension Clinic.  Unfortunately post catheterization, she developed pain in her right leg after compression of her right groin.  She was found to have an occlusion of the right limb of her aortobifem bypass.  She was seen by Larina Earthly, MD, on February 09, 2011.  She was taken to the operating room for a thrombectomy of the graft, which went well.  This restored normal flow.  She did not have any more problems with this.  Throughout the remainder of the hospitalization, she did very well on milrinone.  She did have some multifocal atrial tachycardia with heart rates in the 130-140 range.  We decided to start a low-dose amiodaronewhich worked well.  Of note, she was previously on sotalol for a presumed atrial fibrillation.  However, given her age and renal dysfunction, we decided to stop this and instead favor amiodarone. While in the hospital, she was seen by case management as well as physical therapy and occupational therapy who worked with her on rehab. Her renal function improved steadily with the milrinone.  Her Co- oximetry gases remained in the high 50s to low 70 range.  Her CVP was persistently under 5 and thus, her Lasix was stopped.  Her weight on admission was 53 kg, on discharge it was 45.2 kg.  During the hospitalization, she was also seen by the palliative care service.  I did discuss goals of care.  She expressed her wishes to be DNR/DNI.  However, she would want to be  treated with milrinone for symptom improvement.  At the time of discharge, her sodium was 139, potassium 3.6, BUN 15, creatinine of 1.4.  Once again, her weight was 45.4 kg.  DISCHARGE MEDICATIONS: 1. Milrinone at 0.25 mcg/kg/minute through a PICC line in the right     upper extremity. 2. Amiodarone 200 mg a day. 3. Digoxin 0.0625 mg per day. 4. Adcirca 10 mg a day. 5. Tramadol 50-100 mg q.6 h. p.r.n. 6. Aspirin 81 mg. 7. Atorvastatin 40 mg a day. 8. Levothyroxine 150 mcg a day. 9. Multivitamins 1 tablet a day. 10.Protonix 40 mg a day.  FOLLOWUP:  After discharge will be: 1. With advanced home care. 2. She will be seen in the Heart Failure Clinic on Thursday, February 22, 2011, at 1 p.m.  She was given explicit instructions to call for any problems.  I have advised her that I think it is quite reasonable for her to remain in [place] with her daughters for several weeks prior to attempting to return home to [place].  We will follow her closely in the Heart Failure Clinic.     Bevelyn Buckles. Adelfo Diebel, MD     DRB/MEDQ  D:  02/15/2011  T:  02/15/2011  Job:  [id number]  Electronically Signed by [name] MD on 02/16/2011 02:10:08 PM

## 2011-02-16 NOTE — H&P (Signed)
Kelly Hart, Kelly Hart NO.:  [id number]  [name]  LOCATION:  2904                         FACILITY:  [facility]  PHYSICIAN:  Bevelyn Buckles. Lecil Tapp, MDDATE OF BIRTH:  10/13/1932  DATE OF ADMISSION:  02/02/2011 DATE OF DISCHARGE:                             HISTORY & PHYSICAL   PRIMARY CARE PHYSICIAN:  Prescott Parma, MD, Maryruth Bun.  REFERRING PHYSICIAN:  Demetria Pore. Belford, MD  REASON FOR ADMISSION:  Pulmonary hypertension with cor pulmonale and cardiogenic shock status post PA arrest.  HISTORY OF PRESENT ILLNESS:  Kelly Hart is a very pleasant 77 year old woman with a history of atrial fibrillation, hypertension, hyperlipidemia, hypothyroidism, COPD with recent cessation of tobacco smoking, and CAD status post aortobifemoral bypass grafting.  She denies any history of known heart disease.  She says over the past few months, she has begun to develop more dyspnea on exertion.  This has been especially bad over the past few weeks.  She said that when she walks around the store, she now should stop at about every oval.  She has not had any chest pain, orthopnea, PND, or lower extremity edema. According to the note, she was hospitalized about a month ago in [place], at which time she was reported to have possible pneumonia. She apparently had extensive workup there including a CT scan, echocardiogram, and Cardiolite stress test which was reportedly normal. According to the report, she had a hyperdynamic LV with an EF of 75-80% with mild LVH, severe TR, and evidence of RV dysfunction.  Pulmonary artery pressure was estimated around 50.  She was treated and discharged home.  Her family tells me that while at [facility], she had a possible syncopal episode with diuresis.  For the past couple of weeks, she has had progressive dyspnea with a near class 4 symptoms.  When she was admitted, her BNP was elevated at 2200 and troponins were mildly  positive at 0.20.  She was seen by Dr. Titus Mould who felt like pulmonary hypertension was her main issue with some overlying left ventricular diastolic dysfunction.  She was taken to the ICU.  She then developed some chest pain overnight and became clammy and hypotensive.  Following this, she had what appeared to be a PAOS with severe acidosis.  ABG revealed a pH of 7.0 with a PCO2 of 34, PO2 of 128.  She was started on bicarb and a dopamine drip.  While there, she had a V/Q scan, which showed no evidence of pulmonary embolus.  She was resuscitated successfully and transferred here for further evaluation by our pulmonary hypertension team.  Upon arrival, she was comfortable.  Blood pressure was about 100.  She was mildly tachycardic at 105.  She was saturating in the mid 90s on 2 L nasal cannula.  She denied any chest pain.  REVIEW OF SYSTEMS:  She does have chronic diarrhea which is thought to be related to her treatment of her previous cervical cancer.  She also has had a 20-pound weight loss over the past year.  She used to smoke about 3/4 of a pack of cigarettes a day for about 40 years, but she stopped this several weeks ago.  Remainder of review of systems, all systems are negative except for HPI and problem list.  PROBLEM LIST: 1. Atrial fibrillation, maintained on sotalol previously on Pradaxa,     but she had self discontinued this due to fear of bleeding. 2. Peripheral arterial disease.     a.     Status post aortobifem bypass grafting by Dr. Titus Mould. 3. Pulmonary hypertension and cor pulmonale by echo. 4. Hypothyroidism. 5. Hyperlipidemia. 6. Hypertension. 7. Chronic obstructive pulmonary disease with recently quit from     tobacco. 8. Chronic renal insufficiency, baseline creatinine about 1.3. 9. History of cervical cancer status post treatment. 10.Chronic diarrhea secondary to treatment of her cervical cancer. 11.History of left hydronephrosis.  MEDICATIONS ON  TRANSFER: 1. Sotalol 80 once a day. 2. Atorvastatin 40 a day. 3. Verapamil 120 a day. 4. Protonix 1 tablet a day. 5. Levothyroxine 150 mcg a day. 6. Megace 2 teaspoons every morning.  ALLERGIES:  LISINOPRIL which causes angioedema and anaphylaxis. PENICILLIN and CONTRAST DYE.  SOCIAL HISTORY:  She is widowed.  She lives alone.  She has 4 daughters who provide a lot of support.  She is a former Scientist, product/process development.  She has a history of tobacco use about 1/2 to 3/4 of pack a day for almost 40 years.  Quit several weeks ago.  Denies alcohol use.  FAMILY HISTORY:  Father's history is unknown.  She had a brother die of late onset heart failure.  PHYSICAL EXAMINATION:  GENERAL:  She is cachectic appearing but in no acute distress. VITAL SIGNS:  She is afebrile, blood pressure is 100/80, heart rates about 105, and she is saturating in a 95% range on 2 L nasal cannula. HEENT:  Normal. NECK:  Supple.  JVP is elevated to over a year with prominent CV waves. Carotids are 1+ bilaterally.  No obvious bruits.  There is no lymphadenopathy or thyromegaly. CARDIAC:  Left-sided PMI is nondisplaced.  She has a prominent RV sheet with right-sided gallop.  She is regular and tachycardic.  A 3/6 systolic ejection murmur at the left sternal border.  As above, right- sided gallop. LUNGS:  Diminished throughout with some mild crackles at the bases. ABDOMEN:  Soft, nontender, and nondistended.  Her liver edge is about 3- 4 cm down in the mid midclavicular line. EXTREMITIES:  Warm and cachectic.  There is no cyanosis and no edema. She does have mild clubbing bilaterally.  I am unable to palpate distal pulses.  Her femoral pulses are 2+ bilaterally.  There is no rash. NEUROLOGIC:  Alert and oriented x3.  Cranial nerves II-XII are intact, moves all 4 extremities and syncopal affect is pleasant.  LABORATORY DATA:  From [facility], on admission showed white count 8.9, hemoglobin 12.4, platelet count of 149.   Sodium 143, potassium 3.7, chloride 112, bicarb of 20, BUN of 22, creatinine of 1.26, which is now up to about 1.7.  TSH was normal.  Chest x-ray revealed cardiomegaly and interstitial pulmonary edema with chronic scarring at the right costophrenic angle.  EKG shows sinus tachycardia.  She had a rate of 118.  There is borderline right axis deviation, she has a left anterior fascicular block.  Mild nonspecific ST changes abnormalities.  ASSESSMENT: 1. Pulmonary hypertension with cor pulmonale. 2. Probable cardiogenic shock secondary to pulmonary hypertension with     cor pulmonale. 3. Pulseless electrical activity arrest. 4. Acute on chronic diastolic heart failure. 5. Acute on chronic respiratory failure, improved. 6. Chronic obstructive pulmonary disease with recently quit  from     tobacco. 7. Atrial fibrillation, now in sinus rhythm, on sotalol, has refused     anticoagulation in the past. 8. Peripheral atrial fibrillation, now in sinus rhythm on sotalol has     refused anticoagulation in the past. 9. Peripheral arterial disease status post aortobifemoral bypass. 10.Hypothyroidism, now on Synthroid. 11.Recent 20 pounds weight loss.  PLAN/DISCUSSION:  This is a very complex situation.  She appears to have end-stage cor pulmonale with cardiogenic shock.  I suspect her PA arrest was likely secondary to a drop in the RV preload in the setting of severe pulmonary hypertension.  She will need a right and left heart cath to further evaluate and dictate treatment.  She will also need PFTs.  I have discussed that the severity of the situation with both her and her family at length and also the risks and indications of the procedure.  They have agreed to proceed.  She will be admitted to the ICU and  we will follow her closely.     Bevelyn Buckles. Sieanna Vanstone, MD     DRB/MEDQ  D:  02/02/2011  T:  02/02/2011  Job:  [id number]  Electronically Signed by [name] MD on 02/16/2011  02:10:01 PM

## 2011-02-20 ENCOUNTER — Encounter (HOSPITAL_COMMUNITY): Payer: Self-pay | Admitting: Internal Medicine

## 2011-02-20 ENCOUNTER — Encounter: Payer: Self-pay | Admitting: *Deleted

## 2011-02-22 ENCOUNTER — Ambulatory Visit (HOSPITAL_COMMUNITY)
Admit: 2011-02-22 | Discharge: 2011-02-22 | Disposition: A | Discharge status: Home / Self Care | Payer: Medicare Other | Source: Ambulatory Visit | Attending: Internal Medicine | Admitting: Internal Medicine

## 2011-02-22 VITALS — BP 138/84 | HR 126 | Wt 99.0 lb

## 2011-02-22 DIAGNOSIS — I739 Peripheral vascular disease, unspecified: Secondary | ICD-10-CM

## 2011-02-22 DIAGNOSIS — I2789 Other specified pulmonary heart diseases: Secondary | ICD-10-CM

## 2011-02-22 DIAGNOSIS — I272 Pulmonary hypertension, unspecified: Secondary | ICD-10-CM | POA: Insufficient documentation

## 2011-02-22 MED ORDER — AMIODARONE HCL 200 MG PO TABS: 200.0000 mg | ORAL_TABLET | Freq: Every day | ORAL | Status: DC

## 2011-02-22 MED ORDER — ZOLPIDEM TARTRATE 5 MG PO TABS: 5.0000 mg | ORAL_TABLET | Freq: Every evening | ORAL | Status: DC | PRN

## 2011-02-22 MED ORDER — PANTOPRAZOLE SODIUM 40 MG PO TBEC: 40.0000 mg | DELAYED_RELEASE_TABLET | Freq: Every day | ORAL | Status: DC

## 2011-02-22 NOTE — Progress Notes (Signed)
HPI: 77 year old AA female with of pulseless electrical activity arrest, right heart failure secondary to cor pulmonale,  pulmonary arterial hypertension, Acute-on-chronic respiratory failure, multifocal atrial tachycardia, peripheral arterial disease status post aortobifem bypass with acute occlusion of the right limb of the graft this admission with pen thrombectomy by Dr. Arbie Cookey and hyperlipidemia.  02/08/2011 Cardiac catheterization showed minimal coronary artery disease with 30% in the right coronary artery and 40%in the distal PDA. However, right atrial pressure, we had a mean of 16, a PA pressure of  61/30 with an EDP of 40.  Pulmonary capillary wedge pressure in the right lower lobe pulmonary artery showed a wedge of 30.  LV pressure was 101/9 with an EDP of 13.  There was significant wedged LVEDP gradient concerning for mitral stenosis.  Her Fick cardiac output was 2.0 and cardiac index was 1.4, this was on low-dose dopamine.  Her PA  saturations at that time were 39%.   02/15/2011 Post hospitalization follow up. Denies dyspnea at rest.  Dyspnea on exertion. Limited walking due to dyspnea. Occasional dizziness. Nausea every day. Weight at home has been 97-99 pounds. Sleeps on 2 pillows at night. She continues on home 0.25 mcg/kg/min Milrinone and this is supplied by [facility] via PICC.  [facility] to provide RN and PT visits three times a week.  She also continues on home oxygen. Appetitive is poor. No lower extremity edema. Continues to live her daughter. She requires assistance with ADLs. She did not fill prescription for Amiodarone 200 mg.   ROS: All systems negative except as listed in HPI, PMH and Problem List.  Past Medical History  Diagnosis Date  . Hypertension   . Renal insufficiency     baseline around cr 1.3  . Atrial fibrillation   . COPD (chronic obstructive pulmonary disease)   . CHF (congestive heart failure)     diastolic  . PEA (Pulseless electrical activity) Oct 2012  . PAD  (peripheral artery disease)   . Hyperlipidemia   . Hypothyroidism   . History of cervical cancer   . Chronic diarrhea     secondary to treatment of her cervical cancer  . Hydronephrosis, left   . Pulmonary hypertension     Current Outpatient Prescriptions  Medication Sig Dispense Refill  . aspirin EC 81 MG tablet Take 81 mg by mouth daily.        Marland Kitchen atorvastatin (LIPITOR) 40 MG tablet Take 40 mg by mouth daily.        . digoxin (LANOXIN) 0.125 MG tablet Take 0.625 mcg by mouth daily.        Marland Kitchen levothyroxine (SYNTHROID, LEVOTHROID) 150 MCG tablet Take 150 mcg by mouth daily.        . Tadalafil, PAH, (ADCIRCA) 20 MG TABS Take 10 mg by mouth daily.        Marland Kitchen amiodarone (PACERONE) 200 MG tablet Take 200 mg by mouth daily.        . megestrol (MEGACE) 40 MG/ML suspension Take 400 mg by mouth daily.        . Multiple Vitamins-Minerals (MULTIVITAMINS THER. W/MINERALS) TABS Take 1 tablet by mouth daily.        . pantoprazole (PROTONIX) 40 MG tablet Take 40 mg by mouth daily.        . sodium chloride 0.9 % SOLN with milrinone 1 MG/ML SOLN 200 mcg/mL Inject 0.25 mcg/kg/min into the vein continuous.        . traMADol (ULTRAM-ER) 100 MG 24 hr tablet  Take 100 mg by mouth every 6 (six) hours as needed.           PHYSICAL EXAM: Filed Vitals:   02/22/11 1400  BP: 138/84  Pulse: 126   Weight change:  99 pounds General: Thin appearing. No resp difficulty HEENT: normal Neck: supple. JVP 8-9  Carotids 2+ bilaterally; no bruits. No lymphadenopathy or thryomegaly appreciated. Cor: PMI normal.Tachycardiac Regular rate & rhythm. No rubs, gallops or murmurs. Lungs: clear 2 liters nasal cannula Abdomen: soft, nontender, nondistended. No hepatosplenomegaly. No bruits or masses. Good bowel sounds. Extremities: no cyanosis, clubbing, rash, nedema Neuro: alert & orientedx3, cranial nerves grossly intact. Moves all 4 extremities w/o difficulty. Affect pleasant.      ASSESSMENT & PLAN:

## 2011-02-22 NOTE — Assessment & Plan Note (Addendum)
Has had dramatic response to milrinone however, remains very tenuous with persistent tachycardia and NYHA IIIB symptoms in the face of end-stage RHF and cor pulmonale. No further presyncope/syncope.  Continue adcirca 10 mg daily and IV Milrinone 0.25 mcg/kg/min. Discussed the ability to liberalize fluids oocasionally. Had frank discussion with her and her daughter about poor prognosis and likelihood that she will probably not survive more than a few months and the need for caregiver relief.  Continue home oxygen. Continue home health PT and RN. Will order rolling walker and bed side commode from [facility]. She will need close follow up at [facility]. Will follow up in 2 weeks.   Total time spent is 50 mins with over 50% of the time dedicated to counseling and discussions described above with Dr. Gala Romney.  Patient seen and examined with [name], NP. We discussed all aspects of the encounter. I agree with the assessment and plan as stated above.

## 2011-02-22 NOTE — Assessment & Plan Note (Signed)
Stable. Follow up with Dr Arbie Cookey. Denies numbness in lower extremity.  L groin incision intact

## 2011-02-22 NOTE — Patient Instructions (Addendum)
Amiodarone 200 mg daily  Take Ambien only as needed  Please continue to weigh and record daily  Follow up in two weeks.

## 2011-02-23 ENCOUNTER — Encounter: Payer: Self-pay | Admitting: Vascular Surgery

## 2011-02-25 NOTE — Progress Notes (Signed)
Patient seen and examined with Amy Clegg, NP. We discussed all aspects of the encounter. I agree with the assessment and plan as stated above.   

## 2011-03-01 ENCOUNTER — Encounter: Payer: Self-pay | Admitting: Internal Medicine

## 2011-03-05 ENCOUNTER — Ambulatory Visit (HOSPITAL_COMMUNITY)
Admission: RE | Admit: 2011-03-05 | Discharge: 2011-03-05 | Disposition: A | Discharge status: Home / Self Care | Payer: Medicare Other | Source: Ambulatory Visit | Attending: Internal Medicine | Admitting: Internal Medicine

## 2011-03-05 ENCOUNTER — Other Ambulatory Visit: Payer: Self-pay

## 2011-03-05 VITALS — BP 92/62 | HR 114 | Wt 100.8 lb

## 2011-03-05 DIAGNOSIS — I272 Pulmonary hypertension, unspecified: Secondary | ICD-10-CM

## 2011-03-05 DIAGNOSIS — I2789 Other specified pulmonary heart diseases: Secondary | ICD-10-CM | POA: Insufficient documentation

## 2011-03-05 NOTE — Assessment & Plan Note (Addendum)
Volume status stable however she remains SOB with minimal exertion. She remains tachycardic.She continues on Adcirca 10 mg, continuous oxygen and milrinone 0.25 mg /kg/min. Will check EKG.  Heart rate remains greater than 120.  I am very concerned about her condition.  Follow up in 2 weeks.   Patient seen and examined with [name], NP. We discussed all aspects of the encounter. I agree with the assessment and plan as stated above. Kelly Hart remains quite tenuous though symptomatically she feels stronger. Will continue current therapy. Patient and family aware that her prognosis is quite guarded. Encouraged her to do anything she feels up to.

## 2011-03-05 NOTE — Progress Notes (Signed)
HPI: 77 year old AA female with of pulseless electrical activity arrest, right heart failure secondary to cor pulmonale,  pulmonary arterial hypertension, Acute-on-chronic respiratory failure, multifocal atrial tachycardia, peripheral arterial disease status post aortobifem bypass with acute occlusion of the right limb of the graft this admission with pen thrombectomy by Dr. Arbie Cookey and hyperlipidemia.  02/08/2011 Cardiac catheterization showed minimal coronary artery disease with 30% in the right coronary artery and 40%in the distal PDA. However, right atrial pressure, we had a mean of 16, a PA pressure of  61/30 with an EDP of 40.  Pulmonary capillary wedge pressure in the right lower lobe pulmonary artery showed a wedge of 30.  LV pressure was 101/9 with an EDP of 13.  There was significant wedged LVEDP gradient concerning for mitral stenosis.  Her Fick cardiac output was 2.0 and cardiac index was 1.4, this was on low-dose dopamine.  Her PA  saturations at that time were 39%.   03/04/2011 She is here for follow up. She heard a gurgle from her chest yesterday for about 2 minutes. No problem with her Milrinone pump.   Denies dyspnea at rest.  Dyspnea with limited exertion. She is exhausted after exertion. Limited walking due to dyspnea. Occasional dizziness. Denies nausea.  Weight at home has been 97-98 pounds. Sleeps on 2 pillows at night. She continues on home 0.25 mcg/kg/min Milrinone and this is [facility] by [facility] via [facility].  [facility] to provide RN/OT/ PT visits three times a week. She desaturates into the 80's while working with PT.  She also continues on home oxygen. Appetitive is poor. No lower extremity edema. Continues to live her daughter. She requires assistance with ADLs.   ROS: All systems negative except as listed in HPI, PMH and Problem List.  Past Medical History  Diagnosis Date  . Hypertension   . Renal insufficiency     baseline around cr 1.3  . Atrial fibrillation   . COPD (chronic  obstructive pulmonary disease)   . CHF (congestive heart failure)     diastolic  . PEA (Pulseless electrical activity) Oct 2012  . PAD (peripheral artery disease)   . Hyperlipidemia   . Hypothyroidism   . History of cervical cancer   . Chronic diarrhea     secondary to treatment of her cervical cancer  . Hydronephrosis, left   . Pulmonary hypertension   . GERD (gastroesophageal reflux disease)   . Myocardial infarction     Current Outpatient Prescriptions  Medication Sig Dispense Refill  . amiodarone (PACERONE) 200 MG tablet Take 1 tablet (200 mg total) by mouth daily.  30 tablet  6  . aspirin EC 81 MG tablet Take 81 mg by mouth daily.        Marland Kitchen atorvastatin (LIPITOR) 40 MG tablet Take 40 mg by mouth daily.        . digoxin (LANOXIN) 0.125 MG tablet Take 0.625 mcg by mouth daily.        Marland Kitchen levothyroxine (SYNTHROID, LEVOTHROID) 150 MCG tablet Take 150 mcg by mouth daily.        . Multiple Vitamins-Minerals (MULTIVITAMINS THER. W/MINERALS) TABS Take 1 tablet by mouth daily.        . pantoprazole (PROTONIX) 40 MG tablet Take 1 tablet (40 mg total) by mouth daily.  30 tablet  6  . sodium chloride 0.9 % SOLN with milrinone 1 MG/ML SOLN 200 mcg/mL Inject 0.25 mcg/kg/min into the vein continuous.        . Tadalafil, PAH, (  ADCIRCA) 20 MG TABS Take 10 mg by mouth daily.        . traMADol (ULTRAM) 50 MG tablet Take 50-100 mg by mouth every 6 (six) hours as needed. Maximum dose= 8 tablets per day, for pain       . zolpidem (AMBIEN) 5 MG tablet Take 1 tablet (5 mg total) by mouth at bedtime as needed for sleep.  30 tablet  0     PHYSICAL EXAM: Filed Vitals:   03/05/11 1228  BP: 92/62  Pulse: 114   Weight change:  100 pounds (99 pounds) General: Thin appearing. No resp difficulty HEENT: normal Neck: supple. JVP 7-8  Carotids 2+ bilaterally; no bruits. No lymphadenopathy or thryomegaly appreciated. Cor: PMI normal.Tachycardiac Regular rate & rhythm. No rubs, gallops or  Murmurs.+RV  lift Lungs: clear 2 liters nasal cannula Abdomen: soft, nontender, nondistended. No hepatosplenomegaly. No bruits or masses. Good bowel sounds. Extremities: no cyanosis, clubbing, rash, edema. RUE PICC Neuro: alert & orientedx3, cranial nerves grossly intact. Moves all 4 extremities w/o difficulty. Affect pleasant.  ECG: ST 116   ASSESSMENT & PLAN:

## 2011-03-05 NOTE — Patient Instructions (Addendum)
Please follow up in 2 weeks

## 2011-03-06 ENCOUNTER — Encounter (HOSPITAL_COMMUNITY): Payer: Medicare Other

## 2011-03-06 ENCOUNTER — Telehealth (HOSPITAL_COMMUNITY): Payer: Self-pay | Admitting: *Deleted

## 2011-03-06 MED ORDER — ZOLPIDEM TARTRATE 5 MG PO TABS: 5.0000 mg | ORAL_TABLET | Freq: Every evening | ORAL | Status: DC | PRN

## 2011-03-06 NOTE — Telephone Encounter (Signed)
Received fax from [facility] that patient went to pick up ambien prescription but they did not have one on file, discussed with [name], NP she thought she had ordered Ambien 5 mg qhs prn #30, called into pharmacy

## 2011-03-07 MED ORDER — MILRINONE IN DEXTROSE 200-5 MCG/ML-% IV SOLN: 0.1250 ug/kg/min | INTRAVENOUS | Status: DC

## 2011-03-10 ENCOUNTER — Emergency Department (HOSPITAL_COMMUNITY)
Admission: EM | Admit: 2011-03-10 | Discharge: 2011-03-10 | Disposition: A | Discharge status: Home / Self Care | Payer: Medicare Other | Attending: Emergency Medicine | Admitting: Emergency Medicine

## 2011-03-10 ENCOUNTER — Emergency Department (HOSPITAL_COMMUNITY): Payer: Medicare Other

## 2011-03-10 ENCOUNTER — Telehealth: Payer: Self-pay | Admitting: Physician Assistant

## 2011-03-10 DIAGNOSIS — E785 Hyperlipidemia, unspecified: Secondary | ICD-10-CM | POA: Insufficient documentation

## 2011-03-10 DIAGNOSIS — K219 Gastro-esophageal reflux disease without esophagitis: Secondary | ICD-10-CM | POA: Insufficient documentation

## 2011-03-10 DIAGNOSIS — R0989 Other specified symptoms and signs involving the circulatory and respiratory systems: Secondary | ICD-10-CM | POA: Insufficient documentation

## 2011-03-10 DIAGNOSIS — J449 Chronic obstructive pulmonary disease, unspecified: Secondary | ICD-10-CM | POA: Insufficient documentation

## 2011-03-10 DIAGNOSIS — I252 Old myocardial infarction: Secondary | ICD-10-CM | POA: Insufficient documentation

## 2011-03-10 DIAGNOSIS — J4489 Other specified chronic obstructive pulmonary disease: Secondary | ICD-10-CM | POA: Insufficient documentation

## 2011-03-10 DIAGNOSIS — I509 Heart failure, unspecified: Secondary | ICD-10-CM | POA: Insufficient documentation

## 2011-03-10 DIAGNOSIS — E039 Hypothyroidism, unspecified: Secondary | ICD-10-CM | POA: Insufficient documentation

## 2011-03-10 DIAGNOSIS — R011 Cardiac murmur, unspecified: Secondary | ICD-10-CM | POA: Insufficient documentation

## 2011-03-10 DIAGNOSIS — Z8541 Personal history of malignant neoplasm of cervix uteri: Secondary | ICD-10-CM | POA: Insufficient documentation

## 2011-03-10 DIAGNOSIS — Z79899 Other long term (current) drug therapy: Secondary | ICD-10-CM | POA: Insufficient documentation

## 2011-03-10 DIAGNOSIS — I4891 Unspecified atrial fibrillation: Secondary | ICD-10-CM | POA: Insufficient documentation

## 2011-03-10 DIAGNOSIS — I10 Essential (primary) hypertension: Secondary | ICD-10-CM | POA: Insufficient documentation

## 2011-03-10 DIAGNOSIS — Z7982 Long term (current) use of aspirin: Secondary | ICD-10-CM | POA: Insufficient documentation

## 2011-03-10 NOTE — Telephone Encounter (Signed)
One of pt's caregivers Kelly Hart called this morning to note that they heard a strange sound from Kelly Hart's chest again. Apparently this happened a few weeks ago and at f/u visit with Dr. Gala Romney, this was not felt to represent anything significant. She does not have any implanted devices other than milrinone gtt. The sound was very brief today and sounded like "when you blow into a balloon but you aren't able to get enough air in" - otherwise the patient feels fine without complaints. I discussed with Dr. Eden Emms, he recommended proceed to get CXR today to r/o mediastinal air - patient advised to proceed to urgent care vs. ER to obtain a CXR and be evaluated. Kelly Hart expressed understanding and gratitude.

## 2011-03-10 NOTE — ED Provider Notes (Signed)
History     CSN: [id number] Arrival date & time: 03/10/2011 12:30 PM   First MD Initiated Contact with Patient 03/10/11 1250      Chief Complaint  Patient presents with  . Drug Problem    (Consider location/radiation/quality/duration/timing/severity/associated sxs/prior treatment) Patient is a 77 y.o. female presenting with drug problem. The history is provided by the patient.  Drug Problem  Pt states she was recently in the hospital about 3 wks ago and was discharged on continuous Milrinone injection for pulmonary htn. States since then had one episode two weeks ago and two today of unusual sounds coming from her chest. According to her daughter, sound similar to a "wet blowing into a balloon."  Pt denies any pain with these episodes. No shortness of breath. Pt feeling well and no complaints. Pt has been rechecked by Dr. Freddy Finner since the first episode and was thought to be OK.   Past Medical History  Diagnosis Date  . Hypertension   . Renal insufficiency     baseline around cr 1.3  . Atrial fibrillation   . COPD (chronic obstructive pulmonary disease)   . CHF (congestive heart failure)     diastolic  . PEA (Pulseless electrical activity) Oct 2012  . PAD (peripheral artery disease)   . Hyperlipidemia   . Hypothyroidism   . History of cervical cancer   . Chronic diarrhea     secondary to treatment of her cervical cancer  . Hydronephrosis, left   . Pulmonary hypertension   . GERD (gastroesophageal reflux disease)   . Myocardial infarction     Past Surgical History  Procedure Date  . Aortobifem bypass grafting   . Open thrombectomy Oct 2012  . Pr vein bypass graft,aorto-fem-pop 02/04/2009    Family History  Problem Relation Age of Onset  . Heart failure Brother   . Heart disease Other     History  Substance Use Topics  . Smoking status: Former Smoker -- 0.5 packs/day for 40 years    Types: Cigarettes    Quit date: 01/15/2011  . Smokeless tobacco: Not on  file  . Alcohol Use: No    OB History    Grav Para Term Preterm Abortions TAB SAB Ect Mult Living                  Review of Systems  Constitutional: Negative.   HENT: Negative.   Eyes: Negative.   Respiratory: Negative.   Cardiovascular: Negative.   Gastrointestinal: Negative.   Genitourinary: Negative.   Skin: Negative.   Neurological: Negative.   Psychiatric/Behavioral: Negative.     Allergies  Iodine; Lisinopril; Omnipaque; and Penicillins  Home Medications   Current Outpatient Rx  Name Route Sig Dispense Refill  . AMIODARONE HCL 200 MG PO TABS Oral Take 1 tablet (200 mg total) by mouth daily. 30 tablet 6  . ASPIRIN EC 81 MG PO TBEC Oral Take 81 mg by mouth daily.      . ATORVASTATIN CALCIUM 40 MG PO TABS Oral Take 40 mg by mouth daily.      Marland Kitchen DIGOXIN 0.125 MG PO TABS Oral Take 0.625 mcg by mouth daily.      Marland Kitchen LEVOTHYROXINE SODIUM 150 MCG PO TABS Oral Take 150 mcg by mouth daily.      Marland Kitchen MILRINONE IN DEXTROSE 200-5 MCG/ML-% IV SOLN Intravenous Inject 5.7125 mcg/min into the vein continuous. 100 mL   . THERA M PLUS PO TABS Oral Take 1 tablet by mouth daily.      Marland Kitchen  PANTOPRAZOLE SODIUM 40 MG PO TBEC Oral Take 1 tablet (40 mg total) by mouth daily. 30 tablet 6  . TADALAFIL (PAH) 20 MG PO TABS Oral Take 10 mg by mouth daily.      . TRAMADOL HCL 50 MG PO TABS Oral Take 50-100 mg by mouth every 6 (six) hours as needed. Maximum dose= 8 tablets per day, for pain     . ZOLPIDEM TARTRATE 5 MG PO TABS Oral Take 1 tablet (5 mg total) by mouth at bedtime as needed for sleep. 30 tablet 1    BP 154/93  Pulse 108  Temp(Src) 98.1 F (36.7 C) (Oral)  Resp 16  SpO2 98%  Physical Exam  Constitutional: She is oriented to person, place, and time. She appears well-developed and well-nourished. No distress.  HENT:  Head: Normocephalic.  Eyes: Pupils are equal, round, and reactive to light.  Neck: Neck supple.  Cardiovascular: Normal rate and regular rhythm.   Murmur  heard. Pulmonary/Chest: Effort normal and breath sounds normal. No respiratory distress. She has no wheezes.  Abdominal: Soft. Bowel sounds are normal. There is no tenderness.  Musculoskeletal: She exhibits no edema.  Neurological: She is alert and oriented to person, place, and time.  Skin: Skin is warm and dry.    ED Course  Procedures (including critical care time)  Pt sent here by [facility]. Recommended to get a CXR to r/o pneumomediastinum. Xray negative. Pt in NAD. Denies chest pain, shortness of breath. No episodes here in ED. Will d/c home. Case discussed with Dr. Katrinka Blazing with cardiology, advised to reassure family, close follow up in the office. Advised to return if chest pain, shortness of breath, any other concerning symptom. Dg Chest 2 View  03/10/2011  *RADIOLOGY REPORT*  Clinical Data: 77 year old female with unusual chest sounds from PICC line.  History of hypertension, COPD and CHF.  CHEST - 2 VIEW  Comparison: 02/03/2011  Findings: A right PICC line is again identified with tip overlying cavoatrial junction. Cardiomegaly is again noted with mild peribronchial thickening. There is no evidence of pneumothorax, pneumomediastinum or pleural effusion. No definite focal airspace opacity is noted. No acute bony abnormalities are present.  IMPRESSION: Stable chest exam - no evidence of pneumomediastinum.  Cardiomegaly and mild chronic peribronchial thickening.  Original Report Authenticated By: Rosendo Gros, [name]      MDM          Lottie Mussel, PA 03/10/11 1428

## 2011-03-10 NOTE — ED Notes (Signed)
Today, pt. On amiodarone via iv pump. Pt/family hearing girgling sounds. Changed out drug on Thursday. Happened x 1 before x 2 weeks ago.

## 2011-03-10 NOTE — ED Notes (Signed)
Discharge instructions reviewed with pt/daughter; Verbalizes understanding.  NAD noted; no further c/o's voiced.  Pt to lobby via wheelchair.  VSS.

## 2011-03-11 NOTE — ED Provider Notes (Signed)
I reviewed pt's cxr.  No abn's seen.  Per radiologist, no acute.  Pt is in no distress, no CP.  Pt had already been seen by her own cardiologist.  [facility] spoke to cardiologist on call who agreed pt could be discharged with outpt follow up.    Kelly Hart. Kelly Lamas, MD 03/11/11 1610

## 2011-03-12 ENCOUNTER — Encounter: Payer: Self-pay | Admitting: Vascular Surgery

## 2011-03-13 ENCOUNTER — Encounter: Payer: Self-pay | Admitting: Vascular Surgery

## 2011-03-13 ENCOUNTER — Telehealth (HOSPITAL_COMMUNITY): Payer: Self-pay | Admitting: Physician Assistant

## 2011-03-13 ENCOUNTER — Ambulatory Visit (INDEPENDENT_AMBULATORY_CARE_PROVIDER_SITE_OTHER): Payer: Medicare Other | Admitting: Vascular Surgery

## 2011-03-13 VITALS — BP 160/91 | HR 109 | Resp 16 | Ht 64.0 in | Wt 100.0 lb

## 2011-03-13 DIAGNOSIS — I6529 Occlusion and stenosis of unspecified carotid artery: Secondary | ICD-10-CM

## 2011-03-13 MED ORDER — MAGNESIUM OXIDE 400 MG PO TABS: 200.0000 mg | ORAL_TABLET | Freq: Every day | ORAL | Status: DC

## 2011-03-13 NOTE — Telephone Encounter (Signed)
Lab work showed Magnesium level 1.6.  Have sent in Rx for Mag-Ox.  Left message for patient to call back to discuss results.

## 2011-03-13 NOTE — Progress Notes (Signed)
The patient presents today for followup of urgent thrombectomy of right limb of her aortobifemoral bypass following cardiac catheterization. She did well the hospital and was discharged home. She continues to have some difficulty with orifice of breath with exertion which is chronic and due her congestive failure. She has no pain with walking.  Physical exam: Well-developed well-nourished black female in no acute distress. She has 2+ femoral pulses bilaterally. Her right groin incision is well-healed.  Impression and plan: Stable status post thrombectomy of right limb of aortobifemoral bypass. Followup with [place] on an as-needed basis.

## 2011-03-16 ENCOUNTER — Telehealth (HOSPITAL_COMMUNITY): Payer: Self-pay | Admitting: *Deleted

## 2011-03-16 MED ORDER — POTASSIUM CHLORIDE CRYS ER 20 MEQ PO TBCR: 40.0000 meq | EXTENDED_RELEASE_TABLET | ORAL | Status: DC

## 2011-03-16 NOTE — Telephone Encounter (Signed)
Still unable to reach pt and family, labs from 11/29 show k 3.2 per Ulyess Blossom, PA give pt 1 dose of kdur 40 meq, unable to reach pt's family this AM, will continue to try

## 2011-03-16 NOTE — Telephone Encounter (Signed)
Returned call from [place] at [facility].  She had already left for the day but spoke with Verlon Au.  Informed her we had been trying to call Ms Brickman for the past several days without a return call.  [facility] did see Ms Rager today and she has no complaints.  Was able to be in contact Ms Trovato's daughter [name] and discuss lab results and new prescriptions.  She voiced understanding.  Will recheck labs in 1 week.

## 2011-03-16 NOTE — Telephone Encounter (Signed)
Kelly Hart from [facility] called today, Kelly Hart had blood drawn yesterday and her potassium level was 3.2.  They wanted someone to be aware.

## 2011-03-23 ENCOUNTER — Ambulatory Visit (HOSPITAL_COMMUNITY)
Admission: RE | Admit: 2011-03-23 | Discharge: 2011-03-23 | Disposition: A | Discharge status: Home / Self Care | Payer: Medicare Other | Source: Ambulatory Visit | Attending: Internal Medicine | Admitting: Internal Medicine

## 2011-03-23 VITALS — BP 138/72 | HR 110 | Wt 98.5 lb

## 2011-03-23 DIAGNOSIS — I272 Pulmonary hypertension, unspecified: Secondary | ICD-10-CM

## 2011-03-23 DIAGNOSIS — I2789 Other specified pulmonary heart diseases: Secondary | ICD-10-CM

## 2011-03-23 MED ORDER — TADALAFIL (PAH) 20 MG PO TABS: 20.0000 mg | ORAL_TABLET | Freq: Every day | ORAL | Status: DC

## 2011-03-23 MED ORDER — MAGNESIUM OXIDE 400 MG PO TABS: 400.0000 mg | ORAL_TABLET | Freq: Every day | ORAL | Status: DC

## 2011-03-23 NOTE — Assessment & Plan Note (Addendum)
NYHA III-IV. Continue hom Milrinone. Continue HH PT/RN. Icrease mag-Ox 400 mg daily. Re-check labs next week.   Patient seen and examined with Kelly Hart, Kelly Hart. We discussed all aspects of the encounter. I agree with the assessment and plan as stated above. Kelly Hart continues with advanced right heart failure but continues to get much stronger with milrinone support. No syncope or presyncope. She is becoming more independent with her ADLs. We had a long discussion with her and her daughter about the possibility of her moving back home. i think we can do this safely as long as she has HHN and a LifeAlert system. She was seen in clinic today by Kelly Hart in [place] who will help [place] arrange the transition back home. She remains DNR/DNI. We will attempt to increase AdCirca to 20 daily.   Time spent 50 minutes.

## 2011-03-23 NOTE — Patient Instructions (Addendum)
Follow up in 3 weeks.   Take Mag-Ox 400 mg daily   Adcirca 20 mg daily as tolerated

## 2011-03-23 NOTE — Progress Notes (Signed)
Patient ID: Kelly Hart, female   DOB: 05/17/1932, 77 y.o.   MRN: [id number]   HPI: 77 year old AA female with of pulseless electrical activity arrest, right heart failure secondary to cor pulmonale,  pulmonary arterial hypertension, Acute-on-chronic respiratory failure, multifocal atrial tachycardia, peripheral arterial disease status post aortobifem bypass with acute occlusion of the right limb of the graft this admission with pen thrombectomy by Dr. Arbie Cookey and hyperlipidemia.  02/08/2011 Cardiac catheterization showed minimal coronary artery disease with 30% in the right coronary artery and 40%in the distal PDA. However, right atrial pressure, we had a mean of 16, a PA pressure of  61/30 with an EDP of 40.  Pulmonary capillary wedge pressure in the right lower lobe pulmonary artery showed a wedge of 30.  LV pressure was 101/9 with an EDP of 13.  There was significant wedged LVEDP gradient concerning for mitral stenosis.  Her Fick cardiac output was 2.0 and cardiac index was 1.4, this was on low-dose dopamine.  Her PA  saturations at that time were 39%.   12/6 magnesium 1.5 started Magnesium last week.   03/23/2011  She is here for follow up.  She continues to feel much stronger and get around more. No problem with her Milrinone pump @0 .20mcg/kg/min.     Dyspnea with mild to moderate exertion.  Occasional dizziness. Denies nausea.  Weight at home has been 95-96 pounds. Sleeps on 2 pillows at night.  [facility] to provide RN/ PT visits three times a week. She reportedly desaturates into the 80's while working with PT.  She also continues on home oxygen. Appetitive is improved . No lower extremity edema. Continues to live her daughter. No syncope, CP or LE edema. No palpitations.  ROS: All systems negative except as listed in HPI, PMH and Problem List.  Past Medical History  Diagnosis Date  . Hypertension   . Renal insufficiency     baseline around cr 1.3  . Atrial fibrillation   . COPD (chronic  obstructive pulmonary disease)   . CHF (congestive heart failure)     diastolic  . PEA (Pulseless electrical activity) Oct 2012  . PAD (peripheral artery disease)   . Hyperlipidemia   . Hypothyroidism   . History of cervical cancer   . Chronic diarrhea     secondary to treatment of her cervical cancer  . Hydronephrosis, left   . Pulmonary hypertension   . GERD (gastroesophageal reflux disease)   . Myocardial infarction     Current Outpatient Prescriptions  Medication Sig Dispense Refill  . amiodarone (PACERONE) 200 MG tablet Take 1 tablet (200 mg total) by mouth daily.  30 tablet  6  . aspirin EC 81 MG tablet Take 81 mg by mouth daily.        Marland Kitchen atorvastatin (LIPITOR) 40 MG tablet Take 40 mg by mouth daily.        . digoxin (LANOXIN) 0.125 MG tablet Take 0.625 mcg by mouth daily.        Marland Kitchen levothyroxine (SYNTHROID, LEVOTHROID) 150 MCG tablet Take 150 mcg by mouth daily.        . magnesium oxide (MAG-OX 400) 400 MG tablet Take 0.5 tablets (200 mg total) by mouth daily.  30 tablet  6  . milrinone (PRIMACOR) 200-5 MCG/ML-% infusion Inject 5.7125 mcg/min into the vein continuous.  100 mL    . Multiple Vitamins-Minerals (MULTIVITAMINS THER. W/MINERALS) TABS Take 1 tablet by mouth daily.        . pantoprazole (PROTONIX)  40 MG tablet Take 1 tablet (40 mg total) by mouth daily.  30 tablet  6  . Tadalafil, PAH, (ADCIRCA) 20 MG TABS Take 10 mg by mouth daily.        . traMADol (ULTRAM) 50 MG tablet Take 50-100 mg by mouth every 6 (six) hours as needed. Maximum dose= 8 tablets per day, for pain       . potassium chloride SA (K-DUR,KLOR-CON) 20 MEQ tablet Take 2 tablets (40 mEq total) by mouth as directed.  20 tablet  3  . zolpidem (AMBIEN) 5 MG tablet Take 1 tablet (5 mg total) by mouth at bedtime as needed for sleep.  30 tablet  1     PHYSICAL EXAM: Filed Vitals:   03/23/11 1034  BP: 138/72  Pulse: 110   Weight change:  100 pounds (98) General: Thin appearing. No resp difficulty wearing  O2 HEENT: normal Neck: supple. JVP 7-8  Carotids 2+ bilaterally; no bruits. No lymphadenopathy or thryomegaly appreciated. Cor: PMI normal.Tachycardiac Regular rate & rhythm. No rubs, gallops or   AM 2/6 .+RV lift Lungs: clear 2 liters nasal cannula Abdomen: soft, nontender, nondistended. No hepatosplenomegaly. No bruits or masses. Good bowel sounds. Extremities: no cyanosis, clubbing, rash, edema. RUE PICC Neuro: alert & orientedx3, cranial nerves grossly intact. Moves all 4 extremities w/o difficulty. Affect pleasant.    ASSESSMENT & PLAN:

## 2011-04-03 ENCOUNTER — Encounter: Payer: Self-pay | Admitting: Internal Medicine

## 2011-04-06 ENCOUNTER — Ambulatory Visit (HOSPITAL_COMMUNITY)
Admission: RE | Admit: 2011-04-06 | Discharge: 2011-04-06 | Disposition: A | Discharge status: Home / Self Care | Payer: Medicare Other | Source: Ambulatory Visit | Attending: Internal Medicine | Admitting: Internal Medicine

## 2011-04-06 VITALS — BP 126/72 | HR 109 | Wt 101.2 lb

## 2011-04-06 DIAGNOSIS — I2789 Other specified pulmonary heart diseases: Secondary | ICD-10-CM | POA: Insufficient documentation

## 2011-04-06 DIAGNOSIS — I272 Pulmonary hypertension, unspecified: Secondary | ICD-10-CM

## 2011-04-06 NOTE — Patient Instructions (Signed)
Continue the same medications.  Labs next week.  Follow up with Dr. Gala Romney with echo in 1 month.    Can follow up with Dr. Lewayne Bunting at the [facility] office.  [phone])

## 2011-04-06 NOTE — Assessment & Plan Note (Addendum)
NYHA III-IV. She continues to do well on milrinone, continue at current dose.  Her volume status looks good today.  No changes at this time.  Will reevaluate echo in 1 month.  Will continue [facility] PT/RN.  Have discussed with the patient and her daughter that if she does move back to [place] and needs to see Dr. Andee Lineman in [place] for an acute problem that he would be willing to help out.  They are agreeable to this but would like to stay with Dr. Gala Romney as her primary cardiologist.   Patient seen and examined with Ulyess Blossom PA-C. We discussed all aspects of the encounter. I agree with the assessment and plan as stated above. She has done remarkably well on home milrinone. Although she remains frail her right HF has come under good control with no edema or recurrent syncope. She is becoming increasingly independent and I feel it is reasonable for her to try to go back to her house in [place] with help from [facility] and HHPT.We discussed this at length. We will continue to follow. I told her that Dr. Andee Lineman in [place] also is quite adept at treating patients with pulmonanry HTN and HF and she can see him up there in a pinch. MD time spent 45 mins.

## 2011-04-06 NOTE — Progress Notes (Signed)
Patient ID: Kelly Hart, female   DOB: 12-21-1932, 77 y.o.   MRN: [id number]   HPI: 77 year old AA female with of pulseless electrical activity arrest, right heart failure secondary to cor pulmonale,  pulmonary arterial hypertension, Acute-on-chronic respiratory failure, multifocal atrial tachycardia, peripheral arterial disease status post aortobifem bypass with acute occlusion of the right limb of the graft this admission with pen thrombectomy by Dr. Arbie Cookey and hyperlipidemia.  02/08/2011 Cardiac catheterization showed minimal coronary artery disease with 30% in the right coronary artery and 40%in the distal PDA. However, right atrial pressure, we had a mean of 16, a PA pressure of  61/30 with an EDP of 40.  Pulmonary capillary wedge pressure in the right lower lobe pulmonary artery showed a wedge of 30.  LV pressure was 101/9 with an EDP of 13.  There was significant wedged LVEDP gradient concerning for mitral stenosis.  Her Fick cardiac output was 2.0 and cardiac index was 1.4, this was on low-dose dopamine.  Her PA  saturations at that time were 39%.   12/6 magnesium 1.5 started Magnesium last week.  12/20 Mg 1.7, Mg Ox 400 daily   Here for follow up today.  Adcirca increased 20 mg daily last visit.  Continues on milrinone 0.25 mcg/kg/min.  Dyspnea at baseline.  Dizziness improved.  Weight stable 95-97 lbs.  No orthopnea/PND.  No syncope/CP.  No lower extremity edema.  Has had three episodes of N/V since discharge.  The episodes come on quickly and are resolved after she vomits.  She is on continuous O2.  She continues to live with her daughter but after the holidays they may reconsider moving back to [place].  A RN with [facility] comes every Thursday to change PICC line dressing, site looks good.  Walking with walker.  She is complaining of her hair falling out, synthroid dose has been increased in the recent past.    ROS: All systems negative except as listed in HPI, PMH and Problem List.  Past  Medical History  Diagnosis Date  . Hypertension   . Renal insufficiency     baseline around cr 1.3  . Atrial fibrillation   . COPD (chronic obstructive pulmonary disease)   . CHF (congestive heart failure)     diastolic  . PEA (Pulseless electrical activity) Oct 2012  . PAD (peripheral artery disease)   . Hyperlipidemia   . Hypothyroidism   . History of cervical cancer   . Chronic diarrhea     secondary to treatment of her cervical cancer  . Hydronephrosis, left   . Pulmonary hypertension   . GERD (gastroesophageal reflux disease)   . Myocardial infarction     Current Outpatient Prescriptions  Medication Sig Dispense Refill  . amiodarone (PACERONE) 200 MG tablet Take 1 tablet (200 mg total) by mouth daily.  30 tablet  6  . aspirin EC 81 MG tablet Take 81 mg by mouth daily.        Marland Kitchen atorvastatin (LIPITOR) 40 MG tablet Take 40 mg by mouth daily.        . digoxin (LANOXIN) 0.125 MG tablet Take 0.0625 mcg by mouth daily.       Marland Kitchen levothyroxine (SYNTHROID, LEVOTHROID) 150 MCG tablet Take 150 mcg by mouth daily.        . magnesium oxide (MAG-OX 400) 400 MG tablet Take 1 tablet (400 mg total) by mouth daily.  30 tablet  6  . milrinone (PRIMACOR) 200-5 MCG/ML-% infusion Inject 5.7125 mcg/min into the  vein continuous.  100 mL    . Multiple Vitamins-Minerals (MULTIVITAMINS THER. W/MINERALS) TABS Take 1 tablet by mouth daily.        . pantoprazole (PROTONIX) 40 MG tablet Take 1 tablet (40 mg total) by mouth daily.  30 tablet  6  . Tadalafil, PAH, (ADCIRCA) 20 MG TABS Take 1 tablet (20 mg total) by mouth daily.  30 tablet  6  . traMADol (ULTRAM) 50 MG tablet Take 50-100 mg by mouth every 6 (six) hours as needed. Maximum dose= 8 tablets per day, for pain       . zolpidem (AMBIEN) 5 MG tablet Take 1 tablet (5 mg total) by mouth at bedtime as needed for sleep.  30 tablet  1     PHYSICAL EXAM: Filed Vitals:   04/06/11 1154  BP: 126/72  Pulse: 109  Weight: 101 lb 4 oz (45.927 kg)  SpO2:  98%    General: Thin appearing. No resp difficulty wearing O2 HEENT: normal Neck: supple. JVP 6-7  Carotids 2+ bilaterally; no bruits. No lymphadenopathy or thryomegaly appreciated. Cor: PMI normal.Tachycardiac Regular rate & rhythm. No rubs, gallops or   AM 2/6  Lungs: clear 2 liters nasal cannula Abdomen: soft, nontender, nondistended. No hepatosplenomegaly. No bruits or masses. Good bowel sounds. Extremities: no cyanosis, clubbing, rash, edema. RUE PICC Neuro: alert & orientedx3, cranial nerves grossly intact. Moves all 4 extremities w/o difficulty. Affect pleasant.    ASSESSMENT & PLAN:

## 2011-04-12 ENCOUNTER — Telehealth (HOSPITAL_COMMUNITY): Payer: Self-pay | Admitting: *Deleted

## 2011-04-12 MED ORDER — LEVOTHYROXINE SODIUM 100 MCG PO TABS: 100.0000 ug | ORAL_TABLET | Freq: Every day | ORAL | Status: DC

## 2011-04-12 NOTE — Telephone Encounter (Signed)
Received labs from [facility], tsh 0.046 and T4 2.59, per Ulyess Blossom, PA decrease Synthroid to 100 mcg daily, pt's daughter is aware and will pick up new rx, will recheck levels at appt 1/25

## 2011-04-19 ENCOUNTER — Encounter: Payer: Self-pay | Admitting: Internal Medicine

## 2011-04-26 ENCOUNTER — Encounter: Payer: Self-pay | Admitting: Internal Medicine

## 2011-04-30 ENCOUNTER — Telehealth (HOSPITAL_COMMUNITY): Payer: Self-pay | Admitting: *Deleted

## 2011-04-30 NOTE — Telephone Encounter (Signed)
Pt's daughter called stating that pt was having vomiting.  It started Sat AM around 1 and lasted until about 6 am then she seemed ok the rest of the day and vomited again yesterday and once this AM, she states she is having a little diarrhea, no edema and wt is stable, pt complaints of abd very tender and sore to touch, per Ulyess Blossom, PA needs to see pcp, pt's daughter is agreeable and will give them a call.

## 2011-05-03 ENCOUNTER — Telehealth (HOSPITAL_COMMUNITY): Payer: Self-pay | Admitting: *Deleted

## 2011-05-03 ENCOUNTER — Encounter: Payer: Self-pay | Admitting: Internal Medicine

## 2011-05-03 MED ORDER — ATORVASTATIN CALCIUM 40 MG PO TABS: 40.0000 mg | ORAL_TABLET | Freq: Every day | ORAL | Status: DC

## 2011-05-03 NOTE — Telephone Encounter (Signed)
Spoke w/pt's daughter, she states pt is doing better and has not vomited all day and has keep food down, labs reviewed adn k slightly low she will give her 1 kcl tab per Ulyess Blossom, PA

## 2011-05-03 NOTE — Telephone Encounter (Signed)
[name] called today regarding her mom, Kelly Hart, she has been sick with nausea and diarrhea and her weight has dropped.  She wanted you to be aware and what should she do for her mom.  She would like a call back.  Thanks.

## 2011-05-04 ENCOUNTER — Emergency Department (HOSPITAL_COMMUNITY): Payer: PRIVATE HEALTH INSURANCE

## 2011-05-04 ENCOUNTER — Encounter: Payer: Self-pay | Admitting: Internal Medicine

## 2011-05-04 ENCOUNTER — Inpatient Hospital Stay (HOSPITAL_COMMUNITY)
Admission: EM | Admit: 2011-05-04 | Discharge: 2011-05-19 | DRG: 388 | Disposition: A | Discharge status: Home Health | Payer: PRIVATE HEALTH INSURANCE | Attending: Internal Medicine | Admitting: Internal Medicine

## 2011-05-04 ENCOUNTER — Telehealth (HOSPITAL_COMMUNITY): Payer: Self-pay | Admitting: Adult Health

## 2011-05-04 ENCOUNTER — Telehealth (HOSPITAL_COMMUNITY): Payer: Self-pay | Admitting: *Deleted

## 2011-05-04 ENCOUNTER — Encounter (HOSPITAL_COMMUNITY): Payer: Self-pay | Admitting: *Deleted

## 2011-05-04 DIAGNOSIS — J4489 Other specified chronic obstructive pulmonary disease: Secondary | ICD-10-CM | POA: Diagnosis present

## 2011-05-04 DIAGNOSIS — K56609 Unspecified intestinal obstruction, unspecified as to partial versus complete obstruction: Secondary | ICD-10-CM | POA: Diagnosis present

## 2011-05-04 DIAGNOSIS — I252 Old myocardial infarction: Secondary | ICD-10-CM

## 2011-05-04 DIAGNOSIS — I509 Heart failure, unspecified: Secondary | ICD-10-CM | POA: Diagnosis present

## 2011-05-04 DIAGNOSIS — E43 Unspecified severe protein-calorie malnutrition: Secondary | ICD-10-CM | POA: Diagnosis present

## 2011-05-04 DIAGNOSIS — I5032 Chronic diastolic (congestive) heart failure: Secondary | ICD-10-CM | POA: Diagnosis present

## 2011-05-04 DIAGNOSIS — K565 Intestinal adhesions [bands], unspecified as to partial versus complete obstruction: Principal | ICD-10-CM | POA: Diagnosis present

## 2011-05-04 DIAGNOSIS — N184 Chronic kidney disease, stage 4 (severe): Secondary | ICD-10-CM | POA: Diagnosis present

## 2011-05-04 DIAGNOSIS — K219 Gastro-esophageal reflux disease without esophagitis: Secondary | ICD-10-CM | POA: Diagnosis present

## 2011-05-04 DIAGNOSIS — Z8541 Personal history of malignant neoplasm of cervix uteri: Secondary | ICD-10-CM

## 2011-05-04 DIAGNOSIS — E785 Hyperlipidemia, unspecified: Secondary | ICD-10-CM | POA: Diagnosis present

## 2011-05-04 DIAGNOSIS — E876 Hypokalemia: Secondary | ICD-10-CM | POA: Diagnosis present

## 2011-05-04 DIAGNOSIS — R1110 Vomiting, unspecified: Secondary | ICD-10-CM

## 2011-05-04 DIAGNOSIS — E1169 Type 2 diabetes mellitus with other specified complication: Secondary | ICD-10-CM | POA: Diagnosis present

## 2011-05-04 DIAGNOSIS — I2510 Atherosclerotic heart disease of native coronary artery without angina pectoris: Secondary | ICD-10-CM | POA: Diagnosis present

## 2011-05-04 DIAGNOSIS — I428 Other cardiomyopathies: Secondary | ICD-10-CM | POA: Diagnosis present

## 2011-05-04 DIAGNOSIS — D649 Anemia, unspecified: Secondary | ICD-10-CM | POA: Diagnosis present

## 2011-05-04 DIAGNOSIS — I50810 Right heart failure, unspecified: Secondary | ICD-10-CM

## 2011-05-04 DIAGNOSIS — N1330 Unspecified hydronephrosis: Secondary | ICD-10-CM | POA: Diagnosis present

## 2011-05-04 DIAGNOSIS — E039 Hypothyroidism, unspecified: Secondary | ICD-10-CM | POA: Diagnosis present

## 2011-05-04 DIAGNOSIS — I272 Pulmonary hypertension, unspecified: Secondary | ICD-10-CM | POA: Insufficient documentation

## 2011-05-04 DIAGNOSIS — J449 Chronic obstructive pulmonary disease, unspecified: Secondary | ICD-10-CM | POA: Diagnosis present

## 2011-05-04 DIAGNOSIS — I739 Peripheral vascular disease, unspecified: Secondary | ICD-10-CM | POA: Diagnosis present

## 2011-05-04 DIAGNOSIS — I2789 Other specified pulmonary heart diseases: Secondary | ICD-10-CM | POA: Diagnosis present

## 2011-05-04 DIAGNOSIS — I129 Hypertensive chronic kidney disease with stage 1 through stage 4 chronic kidney disease, or unspecified chronic kidney disease: Secondary | ICD-10-CM | POA: Diagnosis present

## 2011-05-04 LAB — COMPREHENSIVE METABOLIC PANEL
AST: 26 U/L (ref 0–37)
Albumin: 3.7 g/dL (ref 3.5–5.2)
Alkaline Phosphatase: 88 U/L (ref 39–117)
BUN: 18 mg/dL (ref 6–23)
Chloride: 95 mEq/L — ABNORMAL LOW (ref 96–112)
Creatinine, Ser: 1.44 mg/dL — ABNORMAL HIGH (ref 0.50–1.10)
Potassium: 3.8 mEq/L (ref 3.5–5.1)
Total Bilirubin: 0.5 mg/dL (ref 0.3–1.2)
Total Protein: 7.3 g/dL (ref 6.0–8.3)

## 2011-05-04 LAB — URINALYSIS, ROUTINE W REFLEX MICROSCOPIC
Glucose, UA: NEGATIVE mg/dL
Hgb urine dipstick: NEGATIVE
Ketones, ur: NEGATIVE mg/dL
Nitrite: NEGATIVE
Protein, ur: 30 mg/dL — AB
Specific Gravity, Urine: 1.01 (ref 1.005–1.030)
Urobilinogen, UA: 0.2 mg/dL (ref 0.0–1.0)
pH: 8 (ref 5.0–8.0)

## 2011-05-04 LAB — DIFFERENTIAL
Basophils Absolute: 0 10*3/uL (ref 0.0–0.1)
Basophils Relative: 0 % (ref 0–1)
Eosinophils Absolute: 0 10*3/uL (ref 0.0–0.7)
Monocytes Relative: 9 % (ref 3–12)
Neutro Abs: 4.9 10*3/uL (ref 1.7–7.7)
Neutrophils Relative %: 76 % (ref 43–77)

## 2011-05-04 LAB — CBC
MCH: 28.2 pg (ref 26.0–34.0)
MCHC: 31.9 g/dL (ref 30.0–36.0)
RDW: 14.2 % (ref 11.5–15.5)

## 2011-05-04 LAB — LIPASE, BLOOD: Lipase: 24 U/L (ref 11–59)

## 2011-05-04 LAB — URINE CULTURE
Colony Count: 75000
Culture  Setup Time: 201301182117

## 2011-05-04 LAB — DIGOXIN LEVEL: Digoxin Level: 0.8 ng/mL (ref 0.8–2.0)

## 2011-05-04 LAB — URINE MICROSCOPIC-ADD ON

## 2011-05-04 MED ORDER — ALUM & MAG HYDROXIDE-SIMETH 200-200-20 MG/5ML PO SUSP
30.0000 mL | Freq: Four times a day (QID) | ORAL | Status: DC | PRN
  Administered 2011-05-10: 30 mL via ORAL
  Filled 2011-05-04 (×2): qty 30

## 2011-05-04 MED ORDER — MILRINONE IN DEXTROSE 200-5 MCG/ML-% IV SOLN
0.1250 ug/kg/min | INTRAVENOUS | Status: DC
Start: 2011-05-04 — End: 2011-05-04
  Filled 2011-05-04: qty 100

## 2011-05-04 MED ORDER — ONDANSETRON HCL 4 MG/2ML IJ SOLN
4.0000 mg | Freq: Four times a day (QID) | INTRAMUSCULAR | Status: DC | PRN
  Administered 2011-05-10: 4 mg via INTRAVENOUS
  Filled 2011-05-04 (×2): qty 2

## 2011-05-04 MED ORDER — ENOXAPARIN SODIUM 40 MG/0.4ML ~~LOC~~ SOLN
40.0000 mg | SUBCUTANEOUS | Status: DC
  Administered 2011-05-05 – 2011-05-06 (×2): 40 mg via SUBCUTANEOUS
  Filled 2011-05-04 (×2): qty 0.4

## 2011-05-04 MED ORDER — DIGOXIN 125 MCG PO TABS
0.0625 ug | ORAL_TABLET | Freq: Every day | ORAL | Status: DC
  Administered 2011-05-05 – 2011-05-10 (×5): 62.5 ug via ORAL
  Filled 2011-05-04 (×8): qty 0.5

## 2011-05-04 MED ORDER — ZOLPIDEM TARTRATE 5 MG PO TABS: 5.0000 mg | ORAL_TABLET | Freq: Every evening | ORAL | Status: DC | PRN

## 2011-05-04 MED ORDER — MAGNESIUM OXIDE 400 MG PO TABS
400.0000 mg | ORAL_TABLET | Freq: Every day | ORAL | Status: DC
  Administered 2011-05-05 – 2011-05-09 (×5): 400 mg via ORAL
  Filled 2011-05-04 (×5): qty 1

## 2011-05-04 MED ORDER — SODIUM CHLORIDE 0.9 % IV BOLUS (SEPSIS)
1000.0000 mL | Freq: Once | INTRAVENOUS | Status: AC
  Administered 2011-05-04: 1000 mL via INTRAVENOUS

## 2011-05-04 MED ORDER — METOCLOPRAMIDE HCL 5 MG/ML IJ SOLN
10.0000 mg | Freq: Once | INTRAMUSCULAR | Status: AC
  Administered 2011-05-04: 10 mg via INTRAVENOUS
  Filled 2011-05-04: qty 2

## 2011-05-04 MED ORDER — HYDROMORPHONE HCL PF 1 MG/ML IJ SOLN
0.5000 mg | INTRAMUSCULAR | Status: DC | PRN
  Administered 2011-05-10: 1 mg via INTRAVENOUS
  Filled 2011-05-04: qty 1

## 2011-05-04 MED ORDER — MORPHINE SULFATE 4 MG/ML IJ SOLN
4.0000 mg | Freq: Once | INTRAMUSCULAR | Status: AC
  Administered 2011-05-04: 4 mg via INTRAVENOUS
  Filled 2011-05-04: qty 1

## 2011-05-04 MED ORDER — ONDANSETRON HCL 4 MG PO TABS: 4.0000 mg | ORAL_TABLET | Freq: Four times a day (QID) | ORAL | Status: DC | PRN

## 2011-05-04 MED ORDER — ACETAMINOPHEN 650 MG RE SUPP: 650.0000 mg | Freq: Four times a day (QID) | RECTAL | Status: DC | PRN

## 2011-05-04 MED ORDER — ASPIRIN EC 81 MG PO TBEC
81.0000 mg | DELAYED_RELEASE_TABLET | Freq: Every day | ORAL | Status: DC
  Administered 2011-05-05 – 2011-05-19 (×14): 81 mg via ORAL
  Filled 2011-05-04 (×15): qty 1

## 2011-05-04 MED ORDER — TADALAFIL (PAH) 20 MG PO TABS
20.0000 mg | ORAL_TABLET | Freq: Every day | ORAL | Status: DC
  Administered 2011-05-06 – 2011-05-18 (×10): 20 mg via ORAL
  Filled 2011-05-04 (×15): qty 1

## 2011-05-04 MED ORDER — DIPHENHYDRAMINE HCL 50 MG/ML IJ SOLN
25.0000 mg | Freq: Once | INTRAMUSCULAR | Status: AC
  Administered 2011-05-04: 25 mg via INTRAVENOUS
  Filled 2011-05-04: qty 1

## 2011-05-04 MED ORDER — SODIUM CHLORIDE 0.9 % IV SOLN
Freq: Once | INTRAVENOUS | Status: AC
  Administered 2011-05-04: 21:00:00 via INTRAVENOUS

## 2011-05-04 MED ORDER — ACETAMINOPHEN 325 MG PO TABS: 650.0000 mg | ORAL_TABLET | Freq: Four times a day (QID) | ORAL | Status: DC | PRN

## 2011-05-04 MED ORDER — PANTOPRAZOLE SODIUM 40 MG PO TBEC
40.0000 mg | DELAYED_RELEASE_TABLET | Freq: Every day | ORAL | Status: DC
  Administered 2011-05-05 – 2011-05-10 (×5): 40 mg via ORAL
  Filled 2011-05-04 (×5): qty 1

## 2011-05-04 MED ORDER — ONDANSETRON HCL 4 MG/2ML IJ SOLN: 4.0000 mg | Freq: Four times a day (QID) | INTRAMUSCULAR | Status: DC | PRN

## 2011-05-04 MED ORDER — LEVOTHYROXINE SODIUM 100 MCG PO TABS
100.0000 ug | ORAL_TABLET | ORAL | Status: DC
  Administered 2011-05-05 – 2011-05-10 (×6): 100 ug via ORAL
  Filled 2011-05-04 (×8): qty 1

## 2011-05-04 MED ORDER — SODIUM CHLORIDE 0.9 % IV SOLN: INTRAVENOUS | Status: AC

## 2011-05-04 MED ORDER — OXYCODONE HCL 5 MG PO TABS: 5.0000 mg | ORAL_TABLET | ORAL | Status: DC | PRN

## 2011-05-04 MED ORDER — SODIUM CHLORIDE 0.9 % IV SOLN
INTRAVENOUS | Status: DC
  Administered 2011-05-06: 05:00:00 via INTRAVENOUS

## 2011-05-04 MED ORDER — ONDANSETRON HCL 4 MG/2ML IJ SOLN
4.0000 mg | Freq: Once | INTRAMUSCULAR | Status: AC
  Administered 2011-05-04: 4 mg via INTRAVENOUS
  Filled 2011-05-04: qty 2

## 2011-05-04 MED ORDER — LOPERAMIDE HCL 2 MG PO CAPS
4.0000 mg | ORAL_CAPSULE | Freq: Once | ORAL | Status: AC
  Administered 2011-05-04: 4 mg via ORAL
  Filled 2011-05-04: qty 1

## 2011-05-04 MED ORDER — MILRINONE IN DEXTROSE 200-5 MCG/ML-% IV SOLN
0.2500 ug/kg/min | INTRAVENOUS | Status: DC
  Administered 2011-05-05 – 2011-05-18 (×12): 0.25 ug/kg/min via INTRAVENOUS
  Filled 2011-05-04 (×12): qty 100

## 2011-05-04 MED ORDER — THERA M PLUS PO TABS
1.0000 | ORAL_TABLET | Freq: Every day | ORAL | Status: DC
  Administered 2011-05-05 – 2011-05-10 (×5): 1 via ORAL
  Filled 2011-05-04 (×7): qty 1

## 2011-05-04 MED ORDER — MORPHINE SULFATE 4 MG/ML IJ SOLN: 4.0000 mg | INTRAMUSCULAR | Status: DC | PRN

## 2011-05-04 MED ORDER — TRAMADOL HCL 50 MG PO TABS
50.0000 mg | ORAL_TABLET | Freq: Two times a day (BID) | ORAL | Status: DC | PRN
  Filled 2011-05-04: qty 1

## 2011-05-04 MED ORDER — AMIODARONE HCL 200 MG PO TABS
200.0000 mg | ORAL_TABLET | Freq: Every day | ORAL | Status: DC
  Administered 2011-05-05 – 2011-05-10 (×6): 200 mg via ORAL
  Filled 2011-05-04 (×8): qty 1

## 2011-05-04 MED ORDER — SIMVASTATIN 20 MG PO TABS
20.0000 mg | ORAL_TABLET | Freq: Every day | ORAL | Status: DC
  Administered 2011-05-05 – 2011-05-18 (×10): 20 mg via ORAL
  Filled 2011-05-04 (×15): qty 1

## 2011-05-04 NOTE — ED Notes (Signed)
The pt hashad flu-like symptoms for one week.  She has had vomiting and diarrhea  Also c/o abd pain

## 2011-05-04 NOTE — Telephone Encounter (Signed)
Instructed to go to [facility]. [name] verbalized understanding.

## 2011-05-04 NOTE — H&P (Addendum)
DATE OF ADMISSION:  05/04/2011  PCP:    Toma Deiters, MD, MD   Chief Complaint: Nausea and Vomiting and ABD Pain   HPI: Kelly Hart is an 78 y.o. female presents with complaints of nausea and vomiting and generalized ABD Pain X 1 week.  She denies hematemesis, and reports not being able to hold down foods or liquids.  She report passing loose stools.  She denies having fevers or chills.   In the ED a CT scan of the ABD and pelvis revealed a Small Bowel Obstruction and patient was referred for admission.       Past Medical History  Diagnosis Date  . Hypertension   . Renal insufficiency     baseline around cr 1.3  . Atrial fibrillation   . COPD (chronic obstructive pulmonary disease)   . CHF (congestive heart failure)     diastolic  . PEA (Pulseless electrical activity) Oct 2012  . PAD (peripheral artery disease)   . Hyperlipidemia   . Hypothyroidism   . History of cervical cancer   . Chronic diarrhea     secondary to treatment of her cervical cancer  . Hydronephrosis, left   . Pulmonary hypertension   . GERD (gastroesophageal reflux disease)   . Myocardial infarction     Past Surgical History  Procedure Date  . Aortobifem bypass grafting   . Open thrombectomy Oct 2012  . Pr vein bypass graft,aorto-fem-pop 02/04/2009    Medications:  HOME MEDS: Prior to Admission medications   Medication Sig Start Date End Date Taking? Authorizing Provider  amiodarone (PACERONE) 200 MG tablet Take 200 mg by mouth daily. 02/22/11  Yes Amy Clegg, NP  aspirin EC 81 MG tablet Take 81 mg by mouth daily.     Yes Historical Provider, MD  atorvastatin (LIPITOR) 40 MG tablet Take 40 mg by mouth daily. 05/03/11  Yes Dolores Patty, MD  digoxin (LANOXIN) 0.125 MG tablet Take 0.0625 mcg by mouth daily.    Yes Historical Provider, MD  levothyroxine (SYNTHROID, LEVOTHROID) 100 MCG tablet Take 100 mcg by mouth daily. 04/12/11  Yes Hadassah Pais, PA  magnesium oxide (MAG-OX) 400 MG tablet  Take 400 mg by mouth daily. 03/23/11 03/22/12 Yes Amy Clegg, NP  milrinone (PRIMACOR) 200-5 MCG/ML-% infusion Inject 0.125 mcg/kg/min into the vein continuous. 03/07/11  Yes Dolores Patty, MD  Multiple Vitamins-Minerals (MULTIVITAMINS THER. W/MINERALS) TABS Take 1 tablet by mouth daily.     Yes Historical Provider, MD  pantoprazole (PROTONIX) 40 MG tablet Take 40 mg by mouth daily. 02/22/11  Yes Amy Clegg, NP  Tadalafil, PAH, 20 MG TABS Take 20 mg by mouth daily. 03/23/11  Yes Amy Clegg, NP  traMADol (ULTRAM) 50 MG tablet Take 50-100 mg by mouth every 6 (six) hours as needed. Maximum dose= 8 tablets per day, for pain    Yes Historical Provider, MD  zolpidem (AMBIEN) 5 MG tablet Take 5 mg by mouth at bedtime as needed. For insomnia 03/06/11 03/05/12 Yes Amy Clegg, NP    Allergies:  Allergies  Allergen Reactions  . Iodine Swelling  . Lisinopril Swelling    throat  . Omnipaque (Iohexol) Other (See Comments)    Unknown  . Penicillins Swelling    Social History:   reports that she quit smoking about 3 months ago. Her smoking use included Cigarettes. She has a 20 pack-year smoking history. She does not have any smokeless tobacco history on file. She reports that she does not drink alcohol  or use illicit drugs.  Family History: Family History  Problem Relation Age of Onset  . Heart failure Brother   . Heart disease Other     Review of Systems:  The patient denies , fever, weight loss, vision loss, decreased hearing, hoarseness, chest pain, syncope, dyspnea on exertion, peripheral edema, balance deficits, hemoptysis, melena, hematochezia, severe indigestion/heartburn, hematuria, incontinence, genital sores, muscle weakness, suspicious skin lesions, transient blindness, difficulty walking, depression, unusual weight change, abnormal bleeding, enlarged lymph nodes, angioedema, and breast masses.   Physical Exam:  GEN:  Pleasant thin elderly 78 year old African American female examined  and  in no acute distress; cooperative with exam Filed Vitals:   05/04/11 1826 05/04/11 2042 05/04/11 2212 05/04/11 2247  BP: 139/79 114/77 148/76 136/79  Pulse: 99 99 97 94  Temp: 98 F (36.7 C) 98.2 F (36.8 C)  98.2 F (36.8 C)  TempSrc: Oral Oral  Oral  Resp: 18 15 15 18   Height:      Weight:      SpO2: 99% 99% 98% 99%   Blood pressure 136/79, pulse 94, temperature 98.2 F (36.8 C), temperature source Oral, resp. rate 18, height 5\' 4"  (1.626 m), weight 41.731 kg (92 lb), SpO2 99.00%. PSYCH: SHe is alert and oriented x4; does not appear anxious does not appear depressed; affect is normal HEENT: Normocephalic and Atraumatic, Mucous membranes pink; PERRLA; EOM intact; Fundi:  Benign;  No scleral icterus, Nares: Patent, Oropharynx: Clear, Edentulous or Fair Dentition, Neck:  FROM, no cervical lymphadenopathy nor thyromegaly or carotid bruit; no JVD; Breasts:: Not examined CHEST WALL: No tenderness CHEST: Normal respiration, clear to auscultation bilaterally HEART: Regular rate and rhythm; no murmurs rubs or gallops BACK: No kyphosis or scoliosis; no CVA tenderness ABDOMEN: Positive Bowel Sounds, Scaphoid, Obese, soft non-tender; no masses, no organomegaly, no pannus; no intertriginous candida. Rectal Exam: Not done EXTREMITIES: No bone or joint deformity; age-appropriate arthropathy of the hands and knees; no cyanosis, clubbing or edema; no ulcerations. Genitalia: not examined PULSES: 2+ and symmetric SKIN: Normal hydration no rash or ulceration CNS: Cranial nerves 2-12 grossly intact no focal neurologic deficit   Labs & Imaging Results for orders placed during the hospital encounter of 05/04/11 (from the past 48 hour(s))  URINALYSIS, ROUTINE W REFLEX MICROSCOPIC     Status: Abnormal   Collection Time   05/04/11  4:19 PM      Component Value Range Comment   Color, Urine YELLOW  YELLOW     APPearance CLEAR  CLEAR     Specific Gravity, Urine 1.010  1.005 - 1.030     pH 8.0  5.0 - 8.0      Glucose, UA NEGATIVE  NEGATIVE (mg/dL)    Hgb urine dipstick NEGATIVE  NEGATIVE     Bilirubin Urine MODERATE (*) NEGATIVE     Ketones, ur NEGATIVE  NEGATIVE (mg/dL)    Protein, ur 30 (*) NEGATIVE (mg/dL)    Urobilinogen, UA 0.2  0.0 - 1.0 (mg/dL)    Nitrite NEGATIVE  NEGATIVE     Leukocytes, UA SMALL (*) NEGATIVE    URINE MICROSCOPIC-ADD ON     Status: Abnormal   Collection Time   05/04/11  4:19 PM      Component Value Range Comment   Squamous Epithelial / LPF FEW (*) RARE     WBC, UA 7-10  <3 (WBC/hpf)    Bacteria, UA RARE  RARE     Casts HYALINE CASTS (*) NEGATIVE  Urine-Other LESS THAN 10 mL OF URINE SUBMITTED     CBC     Status: Abnormal   Collection Time   05/04/11  5:19 PM      Component Value Range Comment   WBC 6.5  4.0 - 10.5 (K/uL)    RBC 4.18  3.87 - 5.11 (MIL/uL)    Hemoglobin 11.8 (*) 12.0 - 15.0 (g/dL)    HCT 16.1  09.6 - 04.5 (%)    MCV 88.5  78.0 - 100.0 (fL)    MCH 28.2  26.0 - 34.0 (pg)    MCHC 31.9  30.0 - 36.0 (g/dL)    RDW 40.9  81.1 - 91.4 (%)    Platelets 226  150 - 400 (K/uL)   DIFFERENTIAL     Status: Normal   Collection Time   05/04/11  5:19 PM      Component Value Range Comment   Neutrophils Relative 76  43 - 77 (%)    Neutro Abs 4.9  1.7 - 7.7 (K/uL)    Lymphocytes Relative 14  12 - 46 (%)    Lymphs Abs 0.9  0.7 - 4.0 (K/uL)    Monocytes Relative 9  3 - 12 (%)    Monocytes Absolute 0.6  0.1 - 1.0 (K/uL)    Eosinophils Relative 0  0 - 5 (%)    Eosinophils Absolute 0.0  0.0 - 0.7 (K/uL)    Basophils Relative 0  0 - 1 (%)    Basophils Absolute 0.0  0.0 - 0.1 (K/uL)   COMPREHENSIVE METABOLIC PANEL     Status: Abnormal   Collection Time   05/04/11  5:19 PM      Component Value Range Comment   Sodium 141  135 - 145 (mEq/L)    Potassium 3.8  3.5 - 5.1 (mEq/L)    Chloride 95 (*) 96 - 112 (mEq/L)    CO2 36 (*) 19 - 32 (mEq/L)    Glucose, Bld 96  70 - 99 (mg/dL)    BUN 18  6 - 23 (mg/dL)    Creatinine, Ser 7.82 (*) 0.50 - 1.10 (mg/dL)     Calcium 9.6  8.4 - 10.5 (mg/dL)    Total Protein 7.3  6.0 - 8.3 (g/dL)    Albumin 3.7  3.5 - 5.2 (g/dL)    AST 26  0 - 37 (U/L)    ALT 12  0 - 35 (U/L)    Alkaline Phosphatase 88  39 - 117 (U/L)    Total Bilirubin 0.5  0.3 - 1.2 (mg/dL)    GFR calc non Af Amer 34 (*) >90 (mL/min)    GFR calc Af Amer 40 (*) >90 (mL/min)   LIPASE, BLOOD     Status: Normal   Collection Time   05/04/11  5:19 PM      Component Value Range Comment   Lipase 24  11 - 59 (U/L)   DIGOXIN LEVEL     Status: Normal   Collection Time   05/04/11  5:19 PM      Component Value Range Comment   Digoxin Level 0.8  0.8 - 2.0 (ng/mL)    Ct Abdomen Pelvis Wo Contrast  05/04/2011  *RADIOLOGY REPORT*  Clinical Data: Nausea, abdominal pain, poor PO intake  CT ABDOMEN AND PELVIS WITHOUT CONTRAST  Technique:  Multidetector CT imaging of the abdomen and pelvis was performed following the standard protocol without intravenous contrast.  Comparison: [facility] CT abdomen/pelvis dated 01/17/2009  Findings: Chronic interstitial  markings/subpleural reticulation at the lung bases.  Unenhanced liver, spleen, pancreas, and adrenal glands are within normal limits.  Status post cholecystectomy.  No intrahepatic ductal dilatation. Stable common bile duct.  Right kidney is unremarkable.  Moderate left hydronephrosis.  No renal calculi.  Multiple dilated loops of proximal small bowel with abrupt narrowing/transition in the right mid abdomen (series 2/image 40). Distal small bowel/colon are decompressed.  This appearance is compatible with high-grade partial or complete small bowel obstruction.  Colonic diverticulosis, without associated inflammatory changes.  Atherosclerotic calcifications of the abdominal aorta and branch vessels.  Aortobifemoral bypass.  No abdominopelvic ascites.  No suspicious abdominopelvic lymphadenopathy.  Uterus is notable for calcification of the periuterine vessels.  No adnexal masses.  Proximal left hydroureter, which  it abruptly narrows at the level of the crossing left femoral bypass graft (series 2/image 48).  No bladder calculi.  Mild degenerative changes of the visualized thoracolumbar spine. Sclerosis from prior bilateral sacral insufficiency fractures (series 2/image 55).  IMPRESSION: High-grade partial or complete small bowel obstruction with transition in the right mid abdomen.  Moderate left hydronephrosis with proximal left hydroureter, likely on the basis of extrinsic compression secondary to crossing left femoral bypass graft.  Additional ancillary findings as above.  Original Report Authenticated By: Charline Bills, [name]      Assessment:  1.  SBO 2. Nausea and Vomiting Due to #1 3. ARF due to #2 4. ABD Pain 5. Chronic Systolic CHF 6.    Plan:    Patient has been admitted and placed on bowel rest and IV fluids.  Antiemetic therapy has been ordered for her symptoms.  Dr. Clarise Cruz (Her cardiologist) saw the Patient in the ED and she continues on her milrinone drip.  A General Surgery consultation may be needed if the SBO does not improve.  Her regular medications have been reconciled.  And SCDs have been ordered for DVT prophylaxis.  Other plans as per orders.    CODE STATUS:      FULL CODE        Travin Marik C 05/04/2011, 10:48 PM

## 2011-05-04 NOTE — ED Notes (Signed)
MD at bedside. 

## 2011-05-04 NOTE — Progress Notes (Signed)
   78 y/o woman with COPD and severe right heart failure on home milrinone at home as well as home hospice. Presents to ER with 1-2 weeks of intermittent nausea, ab pain and poor po intake. No HF symptoms. On exams no evidence of fluid overload. Labs within normal limits except for UTI.  ER hydrating and giving antibiotics.   If needs to be admitted please triage to IM service and I will follow along for RHF as needed. Continut milrinone.   Thanks for care.    Mckenna Gamm,MD 7:13 PM

## 2011-05-04 NOTE — ED Provider Notes (Signed)
History     CSN: [id number]  Arrival date & time 05/04/11  1559   First MD Initiated Contact with Patient 05/04/11 1629      Chief Complaint  Patient presents with  . Influenza    (Consider location/radiation/quality/duration/timing/severity/associated sxs/prior treatment) Patient is a 78 y.o. female presenting with flu symptoms. The history is provided by the patient.  Influenza  For last week, she has been having vomiting and diarrhea and not being able to hold anything on her stomach. There's been crampy abdominal pain which does improve after a bowel movement and after vomiting, is also worse at night. Current pain is rated at 6/10, but he gets as severe as 10 out of 10., But has had chills and sweats. She denies any dysuria. She's tried treating herself with Pepto-Bismol with no relief. Symptoms are described as severe.  Past Medical History  Diagnosis Date  . Hypertension   . Renal insufficiency     baseline around cr 1.3  . Atrial fibrillation   . COPD (chronic obstructive pulmonary disease)   . CHF (congestive heart failure)     diastolic  . PEA (Pulseless electrical activity) Oct 2012  . PAD (peripheral artery disease)   . Hyperlipidemia   . Hypothyroidism   . History of cervical cancer   . Chronic diarrhea     secondary to treatment of her cervical cancer  . Hydronephrosis, left   . Pulmonary hypertension   . GERD (gastroesophageal reflux disease)   . Myocardial infarction     Past Surgical History  Procedure Date  . Aortobifem bypass grafting   . Open thrombectomy Oct 2012  . Pr vein bypass graft,aorto-fem-pop 02/04/2009    Family History  Problem Relation Age of Onset  . Heart failure Brother   . Heart disease Other     History  Substance Use Topics  . Smoking status: Former Smoker -- 0.5 packs/day for 40 years    Types: Cigarettes    Quit date: 01/15/2011  . Smokeless tobacco: Not on file  . Alcohol Use: No    OB History    Grav Para Term  Preterm Abortions TAB SAB Ect Mult Living                  Review of Systems  All other systems reviewed and are negative.    Allergies  Iodine; Lisinopril; Omnipaque; and Penicillins  Home Medications   Current Outpatient Rx  Name Route Sig Dispense Refill  . AMIODARONE HCL 200 MG PO TABS Oral Take 200 mg by mouth daily.    . ASPIRIN EC 81 MG PO TBEC Oral Take 81 mg by mouth daily.      . ATORVASTATIN CALCIUM 40 MG PO TABS Oral Take 40 mg by mouth daily.    Marland Kitchen DIGOXIN 0.125 MG PO TABS Oral Take 0.0625 mcg by mouth daily.     Marland Kitchen LEVOTHYROXINE SODIUM 100 MCG PO TABS Oral Take 100 mcg by mouth daily.    Marland Kitchen MAGNESIUM OXIDE 400 MG PO TABS Oral Take 400 mg by mouth daily.    Marland Kitchen MILRINONE IN DEXTROSE 200-5 MCG/ML-% IV SOLN Intravenous Inject 0.125 mcg/kg/min into the vein continuous.    Carma Leaven M PLUS PO TABS Oral Take 1 tablet by mouth daily.      Marland Kitchen PANTOPRAZOLE SODIUM 40 MG PO TBEC Oral Take 40 mg by mouth daily.    Marland Kitchen TADALAFIL (PAH) 20 MG PO TABS Oral Take 20 mg by mouth daily.    Marland Kitchen  TRAMADOL HCL 50 MG PO TABS Oral Take 50-100 mg by mouth every 6 (six) hours as needed. Maximum dose= 8 tablets per day, for pain     . ZOLPIDEM TARTRATE 5 MG PO TABS Oral Take 5 mg by mouth at bedtime as needed. For insomnia      BP 112/70  Pulse 106  Temp(Src) 98.2 F (36.8 C) (Oral)  Resp 15  Ht 5\' 4"  (1.626 m)  Wt 92 lb (41.731 kg)  BMI 15.79 kg/m2  SpO2 99%  Physical Exam  Nursing note and vitals reviewed.  78 year old female who is resting comfortably and in no acute distress. Vital signs his are thickened for mild tachycardia with heart rate of 106. Oxygen saturation is 99% which is normal. Head is normocephalic and atraumatic. PERRLA, EOMI. Oropharynx is clear. Neck is supple without adenopathy, JVD, or tenderness. Back is nontender. Lungs are clear without rales, wheezes, or rhonchi. Heart has regular rate and rhythm with a 2-3/6 holosystolic murmur heard at the apex with radiation to the base.  Abdomen is soft, flat, with moderate lower abdominal tenderness. There is no rebound or guarding. Peristalsis is diminished. Extremities have no cyanosis or edema, full range of motion present. Skin is warm and moist without rash. Neurologic: Mental status is normal, cranial nerves are intact, there no focal motor or sensory deficits. Psychiatric: No abnormalities in mood or affect.  ED Course  Procedures (including critical care time)  Results for orders placed during the hospital encounter of 05/04/11  URINALYSIS, ROUTINE W REFLEX MICROSCOPIC      Component Value Range   Color, Urine YELLOW  YELLOW    APPearance CLEAR  CLEAR    Specific Gravity, Urine 1.010  1.005 - 1.030    pH 8.0  5.0 - 8.0    Glucose, UA NEGATIVE  NEGATIVE (mg/dL)   Hgb urine dipstick NEGATIVE  NEGATIVE    Bilirubin Urine MODERATE (*) NEGATIVE    Ketones, ur NEGATIVE  NEGATIVE (mg/dL)   Protein, ur 30 (*) NEGATIVE (mg/dL)   Urobilinogen, UA 0.2  0.0 - 1.0 (mg/dL)   Nitrite NEGATIVE  NEGATIVE    Leukocytes, UA SMALL (*) NEGATIVE   URINE MICROSCOPIC-ADD ON      Component Value Range   Squamous Epithelial / LPF FEW (*) RARE    WBC, UA 7-10  <3 (WBC/hpf)   Bacteria, UA RARE  RARE    Casts HYALINE CASTS (*) NEGATIVE    Urine-Other LESS THAN 10 mL OF URINE SUBMITTED    CBC      Component Value Range   WBC 6.5  4.0 - 10.5 (K/uL)   RBC 4.18  3.87 - 5.11 (MIL/uL)   Hemoglobin 11.8 (*) 12.0 - 15.0 (g/dL)   HCT 78.2  95.6 - 21.3 (%)   MCV 88.5  78.0 - 100.0 (fL)   MCH 28.2  26.0 - 34.0 (pg)   MCHC 31.9  30.0 - 36.0 (g/dL)   RDW 08.6  57.8 - 46.9 (%)   Platelets 226  150 - 400 (K/uL)  DIFFERENTIAL      Component Value Range   Neutrophils Relative 76  43 - 77 (%)   Neutro Abs 4.9  1.7 - 7.7 (K/uL)   Lymphocytes Relative 14  12 - 46 (%)   Lymphs Abs 0.9  0.7 - 4.0 (K/uL)   Monocytes Relative 9  3 - 12 (%)   Monocytes Absolute 0.6  0.1 - 1.0 (K/uL)   Eosinophils Relative 0  0 -  5 (%)   Eosinophils Absolute 0.0   0.0 - 0.7 (K/uL)   Basophils Relative 0  0 - 1 (%)   Basophils Absolute 0.0  0.0 - 0.1 (K/uL)  COMPREHENSIVE METABOLIC PANEL      Component Value Range   Sodium 141  135 - 145 (mEq/L)   Potassium 3.8  3.5 - 5.1 (mEq/L)   Chloride 95 (*) 96 - 112 (mEq/L)   CO2 36 (*) 19 - 32 (mEq/L)   Glucose, Bld 96  70 - 99 (mg/dL)   BUN 18  6 - 23 (mg/dL)   Creatinine, Ser 1.61 (*) 0.50 - 1.10 (mg/dL)   Calcium 9.6  8.4 - 09.6 (mg/dL)   Total Protein 7.3  6.0 - 8.3 (g/dL)   Albumin 3.7  3.5 - 5.2 (g/dL)   AST 26  0 - 37 (U/L)   ALT 12  0 - 35 (U/L)   Alkaline Phosphatase 88  39 - 117 (U/L)   Total Bilirubin 0.5  0.3 - 1.2 (mg/dL)   GFR calc non Af Amer 34 (*) >90 (mL/min)   GFR calc Af Amer 40 (*) >90 (mL/min)  LIPASE, BLOOD      Component Value Range   Lipase 24  11 - 59 (U/L)  DIGOXIN LEVEL      Component Value Range   Digoxin Level 0.8  0.8 - 2.0 (ng/mL)  URINE CULTURE      Component Value Range   Specimen Description URINE, CLEAN CATCH     Special Requests ADDED 05/04/11 2016     Setup Time [id number]     Colony Count 75,000 COLONIES/ML     Culture       Value: Multiple bacterial morphotypes present, none predominant. Suggest appropriate recollection if clinically indicated.   Report Status 05/05/2011 FINAL    BASIC METABOLIC PANEL      Component Value Range   Sodium 138  135 - 145 (mEq/L)   Potassium 3.3 (*) 3.5 - 5.1 (mEq/L)   Chloride 98  96 - 112 (mEq/L)   CO2 31  19 - 32 (mEq/L)   Glucose, Bld 71  70 - 99 (mg/dL)   BUN 15  6 - 23 (mg/dL)   Creatinine, Ser 7.82 (*) 0.50 - 1.10 (mg/dL)   Calcium 8.3 (*) 8.4 - 10.5 (mg/dL)   GFR calc non Af Amer 40 (*) >90 (mL/min)   GFR calc Af Amer 46 (*) >90 (mL/min)  CBC      Component Value Range   WBC 6.6  4.0 - 10.5 (K/uL)   RBC 3.68 (*) 3.87 - 5.11 (MIL/uL)   Hemoglobin 10.2 (*) 12.0 - 15.0 (g/dL)   HCT 95.6 (*) 21.3 - 46.0 (%)   MCV 88.6  78.0 - 100.0 (fL)   MCH 27.7  26.0 - 34.0 (pg)   MCHC 31.3  30.0 - 36.0 (g/dL)   RDW  08.6  57.8 - 46.9 (%)   Platelets 197  150 - 400 (K/uL)  BASIC METABOLIC PANEL      Component Value Range   Sodium 140  135 - 145 (mEq/L)   Potassium 4.1  3.5 - 5.1 (mEq/L)   Chloride 103  96 - 112 (mEq/L)   CO2 24  19 - 32 (mEq/L)   Glucose, Bld 42 (*) 70 - 99 (mg/dL)   BUN 17  6 - 23 (mg/dL)   Creatinine, Ser 6.29 (*) 0.50 - 1.10 (mg/dL)   Calcium 8.3 (*) 8.4 - 10.5 (mg/dL)   GFR  calc non Af Amer 44 (*) >90 (mL/min)   GFR calc Af Amer 51 (*) >90 (mL/min)  MAGNESIUM      Component Value Range   Magnesium 1.9  1.5 - 2.5 (mg/dL)  GLUCOSE, CAPILLARY      Component Value Range   Glucose-Capillary 47 (*) 70 - 99 (mg/dL)  GLUCOSE, CAPILLARY      Component Value Range   Glucose-Capillary 193 (*) 70 - 99 (mg/dL)   Ct Abdomen Pelvis Wo Contrast  05/04/2011  *RADIOLOGY REPORT*  Clinical Data: Nausea, abdominal pain, poor PO intake  CT ABDOMEN AND PELVIS WITHOUT CONTRAST  Technique:  Multidetector CT imaging of the abdomen and pelvis was performed following the standard protocol without intravenous contrast.  Comparison: [facility] CT abdomen/pelvis dated 01/17/2009  Findings: Chronic interstitial markings/subpleural reticulation at the lung bases.  Unenhanced liver, spleen, pancreas, and adrenal glands are within normal limits.  Status post cholecystectomy.  No intrahepatic ductal dilatation. Stable common bile duct.  Right kidney is unremarkable.  Moderate left hydronephrosis.  No renal calculi.  Multiple dilated loops of proximal small bowel with abrupt narrowing/transition in the right mid abdomen (series 2/image 40). Distal small bowel/colon are decompressed.  This appearance is compatible with high-grade partial or complete small bowel obstruction.  Colonic diverticulosis, without associated inflammatory changes.  Atherosclerotic calcifications of the abdominal aorta and branch vessels.  Aortobifemoral bypass.  No abdominopelvic ascites.  No suspicious abdominopelvic lymphadenopathy.   Uterus is notable for calcification of the periuterine vessels.  No adnexal masses.  Proximal left hydroureter, which it abruptly narrows at the level of the crossing left femoral bypass graft (series 2/image 48).  No bladder calculi.  Mild degenerative changes of the visualized thoracolumbar spine. Sclerosis from prior bilateral sacral insufficiency fractures (series 2/image 55).  IMPRESSION: High-grade partial or complete small bowel obstruction with transition in the right mid abdomen.  Moderate left hydronephrosis with proximal left hydroureter, likely on the basis of extrinsic compression secondary to crossing left femoral bypass graft.  Additional ancillary findings as above.  Original Report Authenticated By: Charline Bills, [name]   Dg Abd 2 Views  05/06/2011  *RADIOLOGY REPORT*  Clinical Data: Nausea, small bowel obstruction  ABDOMEN - 2 VIEW  Comparison: CT abdomen pelvis dated 05/04/2011  Findings: Dilated loops of small bowel in the lower abdomen. Residual contrast within decompressed loops of colon.  These findings suggest high-grade partial small bowel obstruction.  Colonic diverticulosis.  No evidence of free air on the lateral decubitus view.  Cholecystectomy clips.  Degenerative changes of the visualized thoracolumbar spine.  IMPRESSION: Dilated loops of small bowel with residual contrast within decompressed loops of colon, suggesting high-grade partial small bowel obstruction.  No free air.  Original Report Authenticated By: Charline Bills, [name]      1. Small bowel obstruction   2. Pulmonary hypertension   3. RHF (right heart failure)    She was given IV fluids along with IV narcotics and antiemetics oh with temporary relief of pain. She required repeated injections of narcotics. CT scan shows a small bowel obstruction which is presumably due to adhesions. Case is discussed with [facility] and arrangements are made to admit the patient.   MDM  Vomiting and diarrhea which  most likely are related to a viral illness. Because of abdominal pain, CT scan will be obtained.        Dione Booze, MD 05/06/11 [phone]

## 2011-05-04 NOTE — ED Notes (Signed)
Called and gave report to KG.

## 2011-05-04 NOTE — Telephone Encounter (Signed)
Vomiting continues to be an issue.  Some days are better than others.  No fever/chills.  Mostly liquid coming up.  Eating bland foods at this point.  Can not keep Ensure down.  Have discussed need to keep hydrated.  She is currently not taking lasix.  If weight goes down or weakness increases she will come to the ER.  With N/V persisting will refer to GI for further work up.  They are agreeable to this plan and appreciate the call back.

## 2011-05-04 NOTE — ED Notes (Signed)
IV team aware of PICC line.  Advised them the patient's daughter said that the line needs to be flushed with heparin qday.  Per IV team, we do not flush lines with heparin, however they will assess the line and flush with NS.

## 2011-05-04 NOTE — ED Notes (Signed)
Card MD recently at [facility], family x2 at [facility], pt & family updated, pt in CT at this time.

## 2011-05-04 NOTE — ED Notes (Signed)
Family at bedside. 

## 2011-05-04 NOTE — Telephone Encounter (Signed)
Spoke with her daughter. She is very concerned her Mothers weight is down 2 pounds and she is nauseated/ vomiting for last week. Unable to take fluids and eat. Occasional dizziness.  Returned call and instructed to come to [facility] ED and she may require fluids.

## 2011-05-04 NOTE — Telephone Encounter (Signed)
Kelly Hart called this am, her mom is still vomitting.  She would like a call back. Thanks!

## 2011-05-05 DIAGNOSIS — I50810 Right heart failure, unspecified: Secondary | ICD-10-CM

## 2011-05-05 DIAGNOSIS — I509 Heart failure, unspecified: Secondary | ICD-10-CM

## 2011-05-05 DIAGNOSIS — I2789 Other specified pulmonary heart diseases: Secondary | ICD-10-CM

## 2011-05-05 LAB — CBC
Hemoglobin: 10.2 g/dL — ABNORMAL LOW (ref 12.0–15.0)
MCH: 27.7 pg (ref 26.0–34.0)
MCHC: 31.3 g/dL (ref 30.0–36.0)

## 2011-05-05 LAB — BASIC METABOLIC PANEL
BUN: 15 mg/dL (ref 6–23)
GFR calc non Af Amer: 40 mL/min — ABNORMAL LOW (ref >90)
Glucose, Bld: 71 mg/dL (ref 70–99)
Potassium: 3.3 mEq/L — ABNORMAL LOW (ref 3.5–5.1)

## 2011-05-05 MED ORDER — POTASSIUM CHLORIDE CRYS ER 20 MEQ PO TBCR
20.0000 meq | EXTENDED_RELEASE_TABLET | Freq: Once | ORAL | Status: AC
  Administered 2011-05-05: 20 meq via ORAL
  Filled 2011-05-05: qty 1

## 2011-05-05 MED ORDER — SODIUM CHLORIDE 0.9 % IJ SOLN
10.0000 mL | INTRAMUSCULAR | Status: DC | PRN
  Administered 2011-05-05 – 2011-05-06 (×4): 10 mL

## 2011-05-05 MED ORDER — POTASSIUM CHLORIDE 10 MEQ/100ML IV SOLN
10.0000 meq | INTRAVENOUS | Status: AC
  Administered 2011-05-05: 10 meq via INTRAVENOUS
  Filled 2011-05-05 (×3): qty 100

## 2011-05-05 NOTE — Progress Notes (Signed)
Subjective: No nausea/vomiting today; feeling slightly better. Still with mild abdominal pain. Reports multiple (2-3) BM's overnight.  Objective: Vital signs in last 24 hours: Temp:  [97.6 F (36.4 C)-98.3 F (36.8 C)] 98.3 F (36.8 C) (01/19 0845) Pulse Rate:  [93-109] 94  (01/19 0845) Resp:  [15-18] 18  (01/19 0845) BP: (104-153)/(62-91) 104/62 mmHg (01/19 0845) SpO2:  [90 %-99 %] 94 % (01/19 0845) FiO2 (%):  [2 %] 2 % (01/18 1826) Weight:  [41.2 kg (90 lb 13.3 oz)-41.731 kg (92 lb)] 41.2 kg (90 lb 13.3 oz) (01/18 2315) Weight change:  Last BM Date: 05/04/11  Intake/Output from previous day: 01/18 0701 - 01/19 0700 In: -  Out: 3 [Stool:3]    Physical Exam: General: Alert, awake, oriented x3, in no acute distress. HEENT: No bruits, no goiter. No JVD Heart: Regular rate and rhythm, positive systolic murmurs, no rubs, no gallops. Lungs: Clear to auscultation bilaterally. Abdomen: Soft, tender to palpation, mild distended, decreased but present bowel sounds. Extremities: No clubbing, cyanosis or edema with positive pedal pulses. Neuro: Grossly intact, nonfocal.   Lab Results: Basic Metabolic Panel:  Basename 05/05/11 0630 05/04/11 1719  NA 138 141  K 3.3* 3.8  CL 98 95*  CO2 31 36*  GLUCOSE 71 96  BUN 15 18  CREATININE 1.27* 1.44*  CALCIUM 8.3* 9.6  MG -- --  PHOS -- --   Liver Function Tests:  Basename 05/04/11 1719  AST 26  ALT 12  ALKPHOS 88  BILITOT 0.5  PROT 7.3  ALBUMIN 3.7    Basename 05/04/11 1719  LIPASE 24  AMYLASE --   CBC:  Basename 05/05/11 0630 05/04/11 1719  WBC 6.6 6.5  NEUTROABS -- 4.9  HGB 10.2* 11.8*  HCT 32.6* 37.0  MCV 88.6 88.5  PLT 197 226    Studies/Results: Ct Abdomen Pelvis Wo Contrast  05/04/2011  *RADIOLOGY REPORT*  Clinical Data: Nausea, abdominal pain, poor PO intake  CT ABDOMEN AND PELVIS WITHOUT CONTRAST  Technique:  Multidetector CT imaging of the abdomen and pelvis was performed following the standard  protocol without intravenous contrast.  Comparison: [facility] CT abdomen/pelvis dated 01/17/2009  Findings: Chronic interstitial markings/subpleural reticulation at the lung bases.  Unenhanced liver, spleen, pancreas, and adrenal glands are within normal limits.  Status post cholecystectomy.  No intrahepatic ductal dilatation. Stable common bile duct.  Right kidney is unremarkable.  Moderate left hydronephrosis.  No renal calculi.  Multiple dilated loops of proximal small bowel with abrupt narrowing/transition in the right mid abdomen (series 2/image 40). Distal small bowel/colon are decompressed.  This appearance is compatible with high-grade partial or complete small bowel obstruction.  Colonic diverticulosis, without associated inflammatory changes.  Atherosclerotic calcifications of the abdominal aorta and branch vessels.  Aortobifemoral bypass.  No abdominopelvic ascites.  No suspicious abdominopelvic lymphadenopathy.  Uterus is notable for calcification of the periuterine vessels.  No adnexal masses.  Proximal left hydroureter, which it abruptly narrows at the level of the crossing left femoral bypass graft (series 2/image 48).  No bladder calculi.  Mild degenerative changes of the visualized thoracolumbar spine. Sclerosis from prior bilateral sacral insufficiency fractures (series 2/image 55).  IMPRESSION: High-grade partial or complete small bowel obstruction with transition in the right mid abdomen.  Moderate left hydronephrosis with proximal left hydroureter, likely on the basis of extrinsic compression secondary to crossing left femoral bypass graft.  Additional ancillary findings as above.  Original Report Authenticated By: Charline Bills, [name]    Medications: Scheduled Meds:   .  sodium chloride   Intravenous Once  . sodium chloride   Intravenous STAT  . amiodarone  200 mg Oral Daily  . aspirin EC  81 mg Oral Daily  . digoxin  62.5 mcg Oral Daily  . diphenhydrAMINE  25 mg  Intravenous Once  . enoxaparin  40 mg Subcutaneous Q24H  . levothyroxine  100 mcg Oral Q0700  . loperamide  4 mg Oral Once  . magnesium oxide  400 mg Oral Daily  . metoCLOPramide (REGLAN) injection  10 mg Intravenous Once  .  morphine injection  4 mg Intravenous Once  .  morphine injection  4 mg Intravenous Once  .  morphine injection  4 mg Intravenous Once  . multivitamins ther. w/minerals  1 tablet Oral Daily  . ondansetron (ZOFRAN) IV  4 mg Intravenous Once  . pantoprazole  40 mg Oral Daily  . simvastatin  20 mg Oral q1800  . sodium chloride  1,000 mL Intravenous Once  . Tadalafil (PAH)  20 mg Oral Daily   Continuous Infusions:   . sodium chloride    . milrinone 0.25 mcg/kg/min (05/05/11 0949)  . DISCONTD: milrinone     PRN Meds:.acetaminophen, acetaminophen, alum & mag hydroxide-simeth, HYDROmorphone, morphine injection, ondansetron (ZOFRAN) IV, ondansetron, oxyCODONE, sodium chloride, traMADol, zolpidem, DISCONTD: ondansetron (ZOFRAN) IV  Assessment/Plan: 1-SBO: high grade most likely due to adhesions. Will continue conservative management with bowel rest, repeat abdominal x-ray in am and if no changes will place NGT. Surgery curb-side no further recommendations at this moment.  2-Pulmonary hypertension and RHF (right heart failure): continue current management. Well compensated. Cardiology on board.  3-Hypokalemia: will replete and try to keep in the 4 range as possible. Will check Mg level.  4-Left hydronephrosis: appears to be chronic 2/2 to bypass into left leg.   5-Dirty urine: no dysuria; cx pending; negative nitrite. Will tx if culture reports infection.  6-DVT: SCD's...    LOS: 1 day   Ezio Wieck [facility] [phone]  05/05/2011, 9:58 AM

## 2011-05-05 NOTE — Progress Notes (Signed)
Subjective:   Kelly Hart is a 78 y/o woman with COPD and severe right heart failure on home milrinone. Presents to ER with 1-2 weeks of intermittent nausea, ab pain and poor po intake. No HF symptoms. On exams no evidence of fluid overload. Labs within normal limits except for UTI. CT abdomen with prominent SBO.  Feels better this am. Had several BMs last light. Hungry this am. No HF symptoms.       Intake/Output Summary (Last 24 hours) at 05/05/11 0904 Last data filed at 05/05/11 0500  Gross per 24 hour  Intake      0 ml  Output      3 ml  Net     -3 ml    Current meds:    . sodium chloride   Intravenous Once  . sodium chloride   Intravenous STAT  . amiodarone  200 mg Oral Daily  . aspirin EC  81 mg Oral Daily  . digoxin  62.5 mcg Oral Daily  . diphenhydrAMINE  25 mg Intravenous Once  . enoxaparin  40 mg Subcutaneous Q24H  . levothyroxine  100 mcg Oral Q0700  . loperamide  4 mg Oral Once  . magnesium oxide  400 mg Oral Daily  . metoCLOPramide (REGLAN) injection  10 mg Intravenous Once  .  morphine injection  4 mg Intravenous Once  .  morphine injection  4 mg Intravenous Once  .  morphine injection  4 mg Intravenous Once  . multivitamins ther. w/minerals  1 tablet Oral Daily  . ondansetron (ZOFRAN) IV  4 mg Intravenous Once  . pantoprazole  40 mg Oral Daily  . simvastatin  20 mg Oral q1800  . sodium chloride  1,000 mL Intravenous Once  . Tadalafil (PAH)  20 mg Oral Daily   Infusions:    . sodium chloride    . milrinone    . DISCONTD: milrinone       Objective:  Blood pressure 104/62, pulse 94, temperature 98.3 F (36.8 C), temperature source Oral, resp. rate 18, height 5\' 4"  (1.626 m), weight 41.2 kg (90 lb 13.3 oz), SpO2 94.00%. Weight change:   Physical Exam: General:  Thin. No resp difficulty HEENT: normal Neck: supple. JVP 9 . Carotids 2+ bilat; no bruits. No lymphadenopathy or thryomegaly appreciated. Cor: RRR. 2/6 TR. Lungs: clear Abdomen: soft,  nontender, nondistended. No hepatosplenomegaly. No bruits or masses. Mild bowel sounds. Extremities: no cyanosis, clubbing, rash, edema Neuro: alert & orientedx3, cranial nerves grossly intact. moves all 4 extremities w/o difficulty. Affect pleasant    Lab Results: Basic Metabolic Panel:  Lab 05/05/11 1610 05/04/11 1719  NA 138 141  K 3.3* 3.8  CL 98 95*  CO2 31 36*  GLUCOSE 71 96  BUN 15 18  CREATININE 1.27* 1.44*  CALCIUM 8.3* 9.6  MG -- --  PHOS -- --   Liver Function Tests:  Lab 05/04/11 1719  AST 26  ALT 12  ALKPHOS 88  BILITOT 0.5  PROT 7.3  ALBUMIN 3.7    Lab 05/04/11 1719  LIPASE 24  AMYLASE --   No results found for this basename: AMMONIA:5 in the last 168 hours CBC:  Lab 05/05/11 0630 05/04/11 1719  WBC 6.6 6.5  NEUTROABS -- 4.9  HGB 10.2* 11.8*  HCT 32.6* 37.0  MCV 88.6 88.5  PLT 197 226   Cardiac Enzymes: No results found for this basename: CKTOTAL:5,CKMB:5,CKMBINDEX:5,TROPONINI:5 in the last 168 hours BNP: No components found with this basename: POCBNP:5 CBG: No  results found for this basename: GLUCAP:5 in the last 168 hours Microbiology: No results found for this basename: cult   No results found for this basename: CULT:2,SDES:2 in the last 168 hours  Imaging: Ct Abdomen Pelvis Wo Contrast  05/04/2011  *RADIOLOGY REPORT*  Clinical Data: Nausea, abdominal pain, poor PO intake  CT ABDOMEN AND PELVIS WITHOUT CONTRAST  Technique:  Multidetector CT imaging of the abdomen and pelvis was performed following the standard protocol without intravenous contrast.  Comparison: [facility] CT abdomen/pelvis dated 01/17/2009  Findings: Chronic interstitial markings/subpleural reticulation at the lung bases.  Unenhanced liver, spleen, pancreas, and adrenal glands are within normal limits.  Status post cholecystectomy.  No intrahepatic ductal dilatation. Stable common bile duct.  Right kidney is unremarkable.  Moderate left hydronephrosis.  No renal  calculi.  Multiple dilated loops of proximal small bowel with abrupt narrowing/transition in the right mid abdomen (series 2/image 40). Distal small bowel/colon are decompressed.  This appearance is compatible with high-grade partial or complete small bowel obstruction.  Colonic diverticulosis, without associated inflammatory changes.  Atherosclerotic calcifications of the abdominal aorta and branch vessels.  Aortobifemoral bypass.  No abdominopelvic ascites.  No suspicious abdominopelvic lymphadenopathy.  Uterus is notable for calcification of the periuterine vessels.  No adnexal masses.  Proximal left hydroureter, which it abruptly narrows at the level of the crossing left femoral bypass graft (series 2/image 48).  No bladder calculi.  Mild degenerative changes of the visualized thoracolumbar spine. Sclerosis from prior bilateral sacral insufficiency fractures (series 2/image 55).  IMPRESSION: High-grade partial or complete small bowel obstruction with transition in the right mid abdomen.  Moderate left hydronephrosis with proximal left hydroureter, likely on the basis of extrinsic compression secondary to crossing left femoral bypass graft.  Additional ancillary findings as above.  Original Report Authenticated By: Charline Bills, [name]     ASSESSMENT:  1) SBO 2) Severe right heart failure on home milrinone and Adcirca 3) COPD. 4) PAD s/p aortobifem graft 5) CRI, stage III-IV  PLAN/DISCUSSION:  Symptomatically improved. No evidence of RHF clinically. Would continue milrinone and Adcirca. Management of SBO per [facility]. Given radiographic appearance of SBO, consider NG tube and having [facility] see but will leave this to their discretion.   If she needs surgery as last option would be at high-risk but not prohibitive.  I will follow. Please call me with questions.  Truman Hayward 9:10 AM Pager [phone]    LOS: 1 day    [name], MD 05/05/2011, 9:04 AM

## 2011-05-06 ENCOUNTER — Inpatient Hospital Stay (HOSPITAL_COMMUNITY): Payer: PRIVATE HEALTH INSURANCE

## 2011-05-06 LAB — GLUCOSE, CAPILLARY: Glucose-Capillary: 193 mg/dL — ABNORMAL HIGH (ref 70–99)

## 2011-05-06 LAB — BASIC METABOLIC PANEL
BUN: 17 mg/dL (ref 6–23)
Calcium: 8.3 mg/dL — ABNORMAL LOW (ref 8.4–10.5)
GFR calc non Af Amer: 44 mL/min — ABNORMAL LOW (ref >90)
Glucose, Bld: 42 mg/dL — CL (ref 70–99)
Sodium: 140 mEq/L (ref 135–145)

## 2011-05-06 MED ORDER — GLUCOSE 40 % PO GEL
ORAL | Status: AC
  Filled 2011-05-06: qty 1

## 2011-05-06 MED ORDER — WHITE PETROLATUM GEL
Status: AC
  Administered 2011-05-06: 08:00:00
  Filled 2011-05-06: qty 5

## 2011-05-06 MED ORDER — SODIUM CHLORIDE 0.9 % IJ SOLN
10.0000 mL | INTRAMUSCULAR | Status: DC | PRN
  Administered 2011-05-06 – 2011-05-10 (×6): 10 mL
  Administered 2011-05-10: 20 mL
  Administered 2011-05-10 – 2011-05-19 (×13): 10 mL

## 2011-05-06 MED ORDER — INSULIN ASPART 100 UNIT/ML ~~LOC~~ SOLN
0.0000 [IU] | SUBCUTANEOUS | Status: DC
  Filled 2011-05-06: qty 3

## 2011-05-06 MED ORDER — ENOXAPARIN SODIUM 30 MG/0.3ML ~~LOC~~ SOLN
30.0000 mg | SUBCUTANEOUS | Status: DC
  Administered 2011-05-07 – 2011-05-19 (×13): 30 mg via SUBCUTANEOUS
  Filled 2011-05-06 (×13): qty 0.3

## 2011-05-06 MED ORDER — DEXTROSE 50 % IV SOLN
INTRAVENOUS | Status: AC
  Administered 2011-05-06: 50 mL
  Filled 2011-05-06: qty 50

## 2011-05-06 MED ORDER — DEXTROSE-NACL 5-0.9 % IV SOLN
INTRAVENOUS | Status: DC
  Administered 2011-05-06 – 2011-05-07 (×2): via INTRAVENOUS
  Administered 2011-05-09: 50 mL/h via INTRAVENOUS
  Administered 2011-05-10: 06:00:00 via INTRAVENOUS

## 2011-05-06 NOTE — Progress Notes (Addendum)
Subjective:   Kelly Hart is a 78 y/o woman with COPD and severe right heart failure on home milrinone. Presents to ER with 1-2 weeks of intermittent nausea, ab pain and poor po intake. No HF symptoms. On exams no evidence of fluid overload. Labs within normal limits except for UTI. CT abdomen with prominent SBO.  Ab pain better but KUB this am still with evidence of SBO. Was hypoglycemic and treated with IV D50.  No HF symptoms.       Intake/Output Summary (Last 24 hours) at 05/06/11 0905 Last data filed at 05/05/11 1829  Gross per 24 hour  Intake      0 ml  Output      1 ml  Net     -1 ml    Current meds:    . sodium chloride   Intravenous STAT  . amiodarone  200 mg Oral Daily  . aspirin EC  81 mg Oral Daily  . dextrose      . digoxin  62.5 mcg Oral Daily  . enoxaparin  40 mg Subcutaneous Q24H  . levothyroxine  100 mcg Oral Q0700  . magnesium oxide  400 mg Oral Daily  . multivitamins ther. w/minerals  1 tablet Oral Daily  . pantoprazole  40 mg Oral Daily  . potassium chloride  10 mEq Intravenous Q1 Hr x 3  . potassium chloride  20 mEq Oral Once  . simvastatin  20 mg Oral q1800  . Tadalafil (PAH)  20 mg Oral Daily  . white petrolatum       Infusions:    . sodium chloride 50 mL/hr at 05/06/11 0529  . milrinone 0.25 mcg/kg/min (05/06/11 0529)     Objective:  Blood pressure 148/74, pulse 93, temperature 97.3 F (36.3 C), temperature source Oral, resp. rate 18, height 5\' 4"  (1.626 m), weight 41.4 kg (91 lb 4.3 oz), SpO2 95.00%. Weight change: -0.331 kg (-11.7 oz)  Physical Exam: General:  Thin. No resp difficulty HEENT: normal Neck: supple. JVP 9 . Carotids 2+ bilat; no bruits. No lymphadenopathy or thryomegaly appreciated. Cor: RRR. 2/6 TR. Lungs: clear Abdomen: soft, nontender, nondistended. No hepatosplenomegaly. No bruits or masses. Mild bowel sounds. Extremities: no cyanosis, clubbing, rash, edema Neuro: alert & orientedx3, cranial nerves grossly intact.  moves all 4 extremities w/o difficulty. Affect pleasant    Lab Results: Basic Metabolic Panel:  Lab 05/06/11 8657 05/05/11 0630 05/04/11 1719  NA 140 138 141  K 4.1 3.3* --  CL 103 98 95*  CO2 24 31 36*  GLUCOSE 42* 71 96  BUN 17 15 18   CREATININE 1.18* 1.27* 1.44*  CALCIUM 8.3* 8.3* 9.6  MG -- -- --  PHOS -- -- --   Liver Function Tests:  Lab 05/04/11 1719  AST 26  ALT 12  ALKPHOS 88  BILITOT 0.5  PROT 7.3  ALBUMIN 3.7    Lab 05/04/11 1719  LIPASE 24  AMYLASE --   No results found for this basename: AMMONIA:5 in the last 168 hours CBC:  Lab 05/05/11 0630 05/04/11 1719  WBC 6.6 6.5  NEUTROABS -- 4.9  HGB 10.2* 11.8*  HCT 32.6* 37.0  MCV 88.6 88.5  PLT 197 226   Cardiac Enzymes: No results found for this basename: CKTOTAL:5,CKMB:5,CKMBINDEX:5,TROPONINI:5 in the last 168 hours BNP: No components found with this basename: POCBNP:5 CBG:  Lab 05/06/11 0825 05/06/11 0801  GLUCAP 193* 47*   Microbiology: Lab Results  Component Value Date   CULT Multiple bacterial morphotypes present,  none predominant. Suggest appropriate recollection if clinically indicated. 05/04/2011    Lab 05/04/11 1619  CULT Multiple bacterial morphotypes present, none predominant. Suggest appropriate recollection if clinically indicated.  SDES URINE, CLEAN CATCH    Imaging: Ct Abdomen Pelvis Wo Contrast  05/04/2011  *RADIOLOGY REPORT*  Clinical Data: Nausea, abdominal pain, poor PO intake  CT ABDOMEN AND PELVIS WITHOUT CONTRAST  Technique:  Multidetector CT imaging of the abdomen and pelvis was performed following the standard protocol without intravenous contrast.  Comparison: [facility] CT abdomen/pelvis dated 01/17/2009  Findings: Chronic interstitial markings/subpleural reticulation at the lung bases.  Unenhanced liver, spleen, pancreas, and adrenal glands are within normal limits.  Status post cholecystectomy.  No intrahepatic ductal dilatation. Stable common bile duct.   Right kidney is unremarkable.  Moderate left hydronephrosis.  No renal calculi.  Multiple dilated loops of proximal small bowel with abrupt narrowing/transition in the right mid abdomen (series 2/image 40). Distal small bowel/colon are decompressed.  This appearance is compatible with high-grade partial or complete small bowel obstruction.  Colonic diverticulosis, without associated inflammatory changes.  Atherosclerotic calcifications of the abdominal aorta and branch vessels.  Aortobifemoral bypass.  No abdominopelvic ascites.  No suspicious abdominopelvic lymphadenopathy.  Uterus is notable for calcification of the periuterine vessels.  No adnexal masses.  Proximal left hydroureter, which it abruptly narrows at the level of the crossing left femoral bypass graft (series 2/image 48).  No bladder calculi.  Mild degenerative changes of the visualized thoracolumbar spine. Sclerosis from prior bilateral sacral insufficiency fractures (series 2/image 55).  IMPRESSION: High-grade partial or complete small bowel obstruction with transition in the right mid abdomen.  Moderate left hydronephrosis with proximal left hydroureter, likely on the basis of extrinsic compression secondary to crossing left femoral bypass graft.  Additional ancillary findings as above.  Original Report Authenticated By: Charline Bills, [name]   Dg Abd 2 Views  05/06/2011  *RADIOLOGY REPORT*  Clinical Data: Nausea, small bowel obstruction  ABDOMEN - 2 VIEW  Comparison: CT abdomen pelvis dated 05/04/2011  Findings: Dilated loops of small bowel in the lower abdomen. Residual contrast within decompressed loops of colon.  These findings suggest high-grade partial small bowel obstruction.  Colonic diverticulosis.  No evidence of free air on the lateral decubitus view.  Cholecystectomy clips.  Degenerative changes of the visualized thoracolumbar spine.  IMPRESSION: Dilated loops of small bowel with residual contrast within decompressed loops of colon,  suggesting high-grade partial small bowel obstruction.  No free air.  Original Report Authenticated By: Charline Bills, [name]     ASSESSMENT:  1) SBO 2) Severe right heart failure on home milrinone and Adcirca 3) COPD. 4) PAD s/p aortobifem graft 5) CRI, stage III-IV 6) DM2, with symptomatic hypoglycemia  PLAN/DISCUSSION:  Symptomatically improved. But SBO persists radiographically. No evidence of RHF clinically. Would continue milrinone and Adcirca. Management of SBO per [facility] & [facility]. Would try and ambulate.  If she needs surgery as last option would be at high-risk but not prohibitive.  I will follow. Please call me with questions.    LOS: 2 days    [name], MD 05/06/2011, 9:05 AM

## 2011-05-06 NOTE — Progress Notes (Signed)
Subjective: No nausea/vomiting today; feeling slightly better. Still with abdominal pain. Reports BM this am. Comfortable in appearance. X-ray without any significant improvement of her SBO.  Objective: Vital signs in last 24 hours: Temp:  [97.3 F (36.3 C)-98.4 F (36.9 C)] 98.3 F (36.8 C) (01/20 1325) Pulse Rate:  [89-101] 89  (01/20 1325) Resp:  [18] 18  (01/20 1325) BP: (134-148)/(66-74) 134/69 mmHg (01/20 1325) SpO2:  [90 %-95 %] 92 % (01/20 1325) Weight:  [41.4 kg (91 lb 4.3 oz)] 41.4 kg (91 lb 4.3 oz) (01/19 2108) Weight change: -0.331 kg (-11.7 oz) Last BM Date: 05/05/11  Intake/Output from previous day: 01/19 0701 - 01/20 0700 In: -  Out: 1 [Urine:1]    Physical Exam: General: Alert, awake, oriented x3, in no acute distress. HEENT: No bruits, no goiter. No JVD Heart: Regular rate and rhythm, positive systolic murmurs, no rubs, no gallops. Lungs: Clear to auscultation bilaterally. Abdomen: Soft, tender to palpation, mild distended, decreased but present bowel sounds. Extremities: No clubbing, cyanosis or edema with positive pedal pulses. Neuro: Grossly intact, nonfocal.   Lab Results: Basic Metabolic Panel:  Basename 05/06/11 0928 05/06/11 0500 05/05/11 0630  NA -- 140 138  K -- 4.1 3.3*  CL -- 103 98  CO2 -- 24 31  GLUCOSE -- 42* 71  BUN -- 17 15  CREATININE -- 1.18* 1.27*  CALCIUM -- 8.3* 8.3*  MG 1.9 -- --  PHOS -- -- --   Liver Function Tests:  Basename 05/04/11 1719  AST 26  ALT 12  ALKPHOS 88  BILITOT 0.5  PROT 7.3  ALBUMIN 3.7    Basename 05/04/11 1719  LIPASE 24  AMYLASE --   CBC:  Basename 05/05/11 0630 05/04/11 1719  WBC 6.6 6.5  NEUTROABS -- 4.9  HGB 10.2* 11.8*  HCT 32.6* 37.0  MCV 88.6 88.5  PLT 197 226    Studies/Results: Ct Abdomen Pelvis Wo Contrast  05/04/2011  *RADIOLOGY REPORT*  Clinical Data: Nausea, abdominal pain, poor PO intake  CT ABDOMEN AND PELVIS WITHOUT CONTRAST  Technique:  Multidetector CT imaging of  the abdomen and pelvis was performed following the standard protocol without intravenous contrast.  Comparison: [facility] CT abdomen/pelvis dated 01/17/2009  Findings: Chronic interstitial markings/subpleural reticulation at the lung bases.  Unenhanced liver, spleen, pancreas, and adrenal glands are within normal limits.  Status post cholecystectomy.  No intrahepatic ductal dilatation. Stable common bile duct.  Right kidney is unremarkable.  Moderate left hydronephrosis.  No renal calculi.  Multiple dilated loops of proximal small bowel with abrupt narrowing/transition in the right mid abdomen (series 2/image 40). Distal small bowel/colon are decompressed.  This appearance is compatible with high-grade partial or complete small bowel obstruction.  Colonic diverticulosis, without associated inflammatory changes.  Atherosclerotic calcifications of the abdominal aorta and branch vessels.  Aortobifemoral bypass.  No abdominopelvic ascites.  No suspicious abdominopelvic lymphadenopathy.  Uterus is notable for calcification of the periuterine vessels.  No adnexal masses.  Proximal left hydroureter, which it abruptly narrows at the level of the crossing left femoral bypass graft (series 2/image 48).  No bladder calculi.  Mild degenerative changes of the visualized thoracolumbar spine. Sclerosis from prior bilateral sacral insufficiency fractures (series 2/image 55).  IMPRESSION: High-grade partial or complete small bowel obstruction with transition in the right mid abdomen.  Moderate left hydronephrosis with proximal left hydroureter, likely on the basis of extrinsic compression secondary to crossing left femoral bypass graft.  Additional ancillary findings as above.  Original Report  Authenticated By: Charline Bills, [name]   Dg Abd 2 Views  05/06/2011  *RADIOLOGY REPORT*  Clinical Data: Nausea, small bowel obstruction  ABDOMEN - 2 VIEW  Comparison: CT abdomen pelvis dated 05/04/2011  Findings: Dilated loops of  small bowel in the lower abdomen. Residual contrast within decompressed loops of colon.  These findings suggest high-grade partial small bowel obstruction.  Colonic diverticulosis.  No evidence of free air on the lateral decubitus view.  Cholecystectomy clips.  Degenerative changes of the visualized thoracolumbar spine.  IMPRESSION: Dilated loops of small bowel with residual contrast within decompressed loops of colon, suggesting high-grade partial small bowel obstruction.  No free air.  Original Report Authenticated By: Charline Bills, [name]    Medications: Scheduled Meds:    . sodium chloride   Intravenous STAT  . amiodarone  200 mg Oral Daily  . aspirin EC  81 mg Oral Daily  . dextrose      . digoxin  62.5 mcg Oral Daily  . enoxaparin  40 mg Subcutaneous Q24H  . levothyroxine  100 mcg Oral Q0700  . magnesium oxide  400 mg Oral Daily  . multivitamins ther. w/minerals  1 tablet Oral Daily  . pantoprazole  40 mg Oral Daily  . potassium chloride  20 mEq Oral Once  . simvastatin  20 mg Oral q1800  . Tadalafil (PAH)  20 mg Oral Daily  . white petrolatum       Continuous Infusions:    . sodium chloride 50 mL/hr at 05/06/11 0529  . milrinone 0.25 mcg/kg/min (05/06/11 0529)   PRN Meds:.acetaminophen, acetaminophen, alum & mag hydroxide-simeth, HYDROmorphone, morphine injection, ondansetron (ZOFRAN) IV, ondansetron, oxyCODONE, sodium chloride, sodium chloride, traMADol, zolpidem  Assessment/Plan: 1-SBO: high grade most likely due to adhesions. Will place NG tube; continue conservative management with bowel rest and follow her symptoms in a.m. Surgery curb-side no further recommendations at this moment.  2-Pulmonary hypertension and RHF (right heart failure): continue current management. Well compensated. Cardiology on board we'll follow the recommendations.  3-Hypokalemia: Repleted.  4-Left hydronephrosis: appears to be chronic 2/2 to bypass into left leg.; Patient with good urination  and without any left flank pain. No further workup at this point.  5-Dirty urine: no dysuria; cx pending; negative nitrite. Will tx if culture reports infection.  6-DVT: SCD's...    LOS: 2 days   Kanda Deluna [facility] [phone]  05/06/2011, 2:35 PM

## 2011-05-06 NOTE — Progress Notes (Signed)
Lab notified RN stating patient cbg was 42mg /dL. Patient off floor to get xray. Patient returning from xray and was still alert and responsive. Patient cbg rechecked and was 47mg /dL. D50 given via IV. Patient states that she did feel a little shaky and her vision was blurry. Patient rechecked after 15 minutes and her blood sugar was 193mg /dL. Md to be notified.

## 2011-05-07 ENCOUNTER — Inpatient Hospital Stay (HOSPITAL_COMMUNITY): Payer: PRIVATE HEALTH INSURANCE

## 2011-05-07 LAB — GLUCOSE, CAPILLARY
Glucose-Capillary: 100 mg/dL — ABNORMAL HIGH (ref 70–99)
Glucose-Capillary: 79 mg/dL (ref 70–99)
Glucose-Capillary: 88 mg/dL (ref 70–99)
Glucose-Capillary: 96 mg/dL (ref 70–99)

## 2011-05-07 MED ORDER — CHLORHEXIDINE GLUCONATE 0.12 % MT SOLN
15.0000 mL | Freq: Two times a day (BID) | OROMUCOSAL | Status: DC
  Administered 2011-05-07 – 2011-05-09 (×6): 15 mL via OROMUCOSAL
  Filled 2011-05-07 (×7): qty 15

## 2011-05-07 MED ORDER — BIOTENE DRY MOUTH MT LIQD
15.0000 mL | Freq: Two times a day (BID) | OROMUCOSAL | Status: DC
  Administered 2011-05-07 – 2011-05-19 (×16): 15 mL via OROMUCOSAL

## 2011-05-07 NOTE — Progress Notes (Signed)
Utilization Review Completed.Kelly Hart T1/21/2013   

## 2011-05-07 NOTE — Progress Notes (Signed)
Subjective:   Kelly Hart is a 78 y/o woman with COPD and severe right heart failure on home milrinone. Presents to ER with 1-2 weeks of intermittent nausea, ab pain and poor po intake. No HF symptoms. On exams no evidence of fluid overload. Labs within normal limits except for UTI. CT abdomen with prominent SBO.  Ab pain better but KUB this am still with evidence of SBO yesterday. NG tube placed. No HF symptoms.  Weight up 9 pounds by chart. We reweighed only 3 pound weight gain. Receiving D5 @ 50cc/hr.   Several small liquid BMs over night.    Intake/Output Summary (Last 24 hours) at 05/07/11 0813 Last data filed at 05/07/11 0500  Gross per 24 hour  Intake      0 ml  Output      2 ml  Net     -2 ml    Current meds:    . amiodarone  200 mg Oral Daily  . antiseptic oral rinse  15 mL Mouth Rinse q12n4p  . aspirin EC  81 mg Oral Daily  . chlorhexidine  15 mL Mouth Rinse BID  . dextrose      . digoxin  62.5 mcg Oral Daily  . enoxaparin (LOVENOX) injection  30 mg Subcutaneous Q24H  . insulin aspart  0-9 Units Subcutaneous Q4H  . levothyroxine  100 mcg Oral Q0700  . magnesium oxide  400 mg Oral Daily  . multivitamins ther. w/minerals  1 tablet Oral Daily  . pantoprazole  40 mg Oral Daily  . simvastatin  20 mg Oral q1800  . Tadalafil (PAH)  20 mg Oral Daily  . DISCONTD: enoxaparin  40 mg Subcutaneous Q24H   Infusions:    . dextrose 5 % and 0.9% NaCl 50 mL/hr at 05/06/11 2135  . milrinone 0.25 mcg/kg/min (05/06/11 0529)  . DISCONTD: sodium chloride 50 mL/hr at 05/06/11 0529     Objective:  Blood pressure 154/79, pulse 94, temperature 98.3 F (36.8 C), temperature source Oral, resp. rate 18, height 5\' 4"  (1.626 m), weight 45.36 kg (100 lb), SpO2 93.00%. Weight change: 3.96 kg (8 lb 11.7 oz)  Physical Exam: General:  Thin. No resp difficulty HEENT: normal Neck: supple. JVP 9 . Carotids 2+ bilat; no bruits. No lymphadenopathy or thryomegaly appreciated. Cor: RRR. 2/6  TR. Lungs: clear Abdomen: soft, nontender, nondistended. No hepatosplenomegaly. + bruits or masses. Mild bowel sounds. Extremities: no cyanosis, clubbing, rash, edema Neuro: alert & orientedx3, cranial nerves grossly intact. moves all 4 extremities w/o difficulty. Affect pleasant    Lab Results: Basic Metabolic Panel:  Lab 05/06/11 1610 05/06/11 0500 05/05/11 0630 05/04/11 1719  NA -- 140 138 141  K -- 4.1 3.3* --  CL -- 103 98 95*  CO2 -- 24 31 36*  GLUCOSE -- 42* 71 96  BUN -- 17 15 18   CREATININE -- 1.18* 1.27* 1.44*  CALCIUM -- 8.3* 8.3* 9.6  MG 1.9 -- -- --  PHOS -- -- -- --   Liver Function Tests:  Lab 05/04/11 1719  AST 26  ALT 12  ALKPHOS 88  BILITOT 0.5  PROT 7.3  ALBUMIN 3.7    Lab 05/04/11 1719  LIPASE 24  AMYLASE --   No results found for this basename: AMMONIA:5 in the last 168 hours CBC:  Lab 05/05/11 0630 05/04/11 1719  WBC 6.6 6.5  NEUTROABS -- 4.9  HGB 10.2* 11.8*  HCT 32.6* 37.0  MCV 88.6 88.5  PLT 197 226   Cardiac  Enzymes: No results found for this basename: CKTOTAL:5,CKMB:5,CKMBINDEX:5,TROPONINI:5 in the last 168 hours BNP: No components found with this basename: POCBNP:5 CBG:  Lab 05/07/11 0745 05/07/11 0414 05/07/11 0003 05/06/11 2142 05/06/11 2020  GLUCAP 89 88 79 72 65*   Microbiology: Lab Results  Component Value Date   CULT Multiple bacterial morphotypes present, none predominant. Suggest appropriate recollection if clinically indicated. 05/04/2011    Lab 05/04/11 1619  CULT Multiple bacterial morphotypes present, none predominant. Suggest appropriate recollection if clinically indicated.  SDES URINE, CLEAN CATCH    Imaging: Dg Abd 2 Views  05/06/2011  *RADIOLOGY REPORT*  Clinical Data: Nausea, small bowel obstruction  ABDOMEN - 2 VIEW  Comparison: CT abdomen pelvis dated 05/04/2011  Findings: Dilated loops of small bowel in the lower abdomen. Residual contrast within decompressed loops of colon.  These findings suggest  high-grade partial small bowel obstruction.  Colonic diverticulosis.  No evidence of free air on the lateral decubitus view.  Cholecystectomy clips.  Degenerative changes of the visualized thoracolumbar spine.  IMPRESSION: Dilated loops of small bowel with residual contrast within decompressed loops of colon, suggesting high-grade partial small bowel obstruction.  No free air.  Original Report Authenticated By: Charline Bills, [name]     ASSESSMENT:  1) SBO 2) Severe right heart failure on home milrinone and Adcirca 3) COPD. 4) PAD s/p aortobifem graft 5) CRI, stage III-IV 6) DM2, with symptomatic hypoglycemia  PLAN/DISCUSSION:  Symptomatically improved. But SBO persists radiographically. No evidence of RHF clinically. Would continue milrinone and Adcirca. Management of SBO per [facility] & [facility]. Would try and ambulate. Continue careful IVFs.   If she needs surgery as last option would be at high-risk but not prohibitive.  I will follow. Please call me with questions.    LOS: 3 days    [name], MD 05/07/2011, 8:13 AM

## 2011-05-07 NOTE — Progress Notes (Signed)
Subjective: No nausea/vomiting today; feeling slightly better. Still with abdominal pain. Reports BM this am. Comfortable in appearance. X-ray without any significant improvement of her SBO.  Objective: Vital signs in last 24 hours: Temp:  [97.3 F (36.3 C)-98.7 F (37.1 C)] 98 F (36.7 C) (01/21 1400) Pulse Rate:  [88-100] 88  (01/21 1400) Resp:  [18-20] 20  (01/21 1400) BP: (146-156)/(79-85) 147/80 mmHg (01/21 1400) SpO2:  [93 %-96 %] 96 % (01/21 1400) Weight:  [42.9 kg (94 lb 9.2 oz)-45.36 kg (100 lb)] 42.9 kg (94 lb 9.2 oz) (01/21 0817) Weight change: 3.96 kg (8 lb 11.7 oz) Last BM Date: 05/05/11  Intake/Output from previous day: 06/01/22 0701 - 01/21 0700 In: -  Out: 2 [Urine:2] Total I/O In: 0  Out: 1001 [Urine:1; Emesis/NG output:1000]  Physical Exam: General: Alert, awake, oriented x3, in no acute distress. HEENT: No bruits, no goiter. No JVD Heart: Regular rate and rhythm, positive systolic murmurs, no rubs, no gallops. Lungs: Clear to auscultation bilaterally. Abdomen: Soft, improved tenderness in her abdomen, non-distended, positive bowel sounds. Extremities: No clubbing, cyanosis or edema with positive pedal pulses. Neuro: Grossly intact, nonfocal.   Lab Results: Basic Metabolic Panel:  Basename 02-Jun-2011 0928 06-02-11 0500 05/05/11 0630  NA -- 140 138  K -- 4.1 3.3*  CL -- 103 98  CO2 -- 24 31  GLUCOSE -- 42* 71  BUN -- 17 15  CREATININE -- 1.18* 1.27*  CALCIUM -- 8.3* 8.3*  MG 1.9 -- --  PHOS -- -- --   Liver Function Tests:  Basename 05/04/11 1719  AST 26  ALT 12  ALKPHOS 88  BILITOT 0.5  PROT 7.3  ALBUMIN 3.7    Basename 05/04/11 1719  LIPASE 24  AMYLASE --   CBC:  Basename 05/05/11 0630 05/04/11 1719  WBC 6.6 6.5  NEUTROABS -- 4.9  HGB 10.2* 11.8*  HCT 32.6* 37.0  MCV 88.6 88.5  PLT 197 226    Studies/Results: Dg Abd 2 Views  2011-06-02  *RADIOLOGY REPORT*  Clinical Data: Nausea, small bowel obstruction  ABDOMEN - 2 VIEW   Comparison: CT abdomen pelvis dated 05/04/2011  Findings: Dilated loops of small bowel in the lower abdomen. Residual contrast within decompressed loops of colon.  These findings suggest high-grade partial small bowel obstruction.  Colonic diverticulosis.  No evidence of free air on the lateral decubitus view.  Cholecystectomy clips.  Degenerative changes of the visualized thoracolumbar spine.  IMPRESSION: Dilated loops of small bowel with residual contrast within decompressed loops of colon, suggesting high-grade partial small bowel obstruction.  No free air.  Original Report Authenticated By: Charline Bills, [name]    Medications: Scheduled Meds:    . amiodarone  200 mg Oral Daily  . antiseptic oral rinse  15 mL Mouth Rinse q12n4p  . aspirin EC  81 mg Oral Daily  . chlorhexidine  15 mL Mouth Rinse BID  . dextrose      . digoxin  62.5 mcg Oral Daily  . enoxaparin (LOVENOX) injection  30 mg Subcutaneous Q24H  . insulin aspart  0-9 Units Subcutaneous Q4H  . levothyroxine  100 mcg Oral Q0700  . magnesium oxide  400 mg Oral Daily  . multivitamins ther. w/minerals  1 tablet Oral Daily  . pantoprazole  40 mg Oral Daily  . simvastatin  20 mg Oral q1800  . Tadalafil (PAH)  20 mg Oral Daily  . DISCONTD: enoxaparin  40 mg Subcutaneous Q24H   Continuous Infusions:    .  dextrose 5 % and 0.9% NaCl 50 mL/hr at 05/06/11 2135  . milrinone 0.25 mcg/kg/min (05/07/11 1149)  . DISCONTD: sodium chloride 50 mL/hr at 05/06/11 0529   PRN Meds:.acetaminophen, acetaminophen, alum & mag hydroxide-simeth, HYDROmorphone, morphine injection, ondansetron (ZOFRAN) IV, ondansetron, oxyCODONE, sodium chloride, sodium chloride, traMADol, zolpidem  Assessment/Plan: 1-SBO: high grade most likely due to adhesions. Today with significant decrease of her abdomen tenderness, and no having any distention. Bowel sounds has also improve. We'll repeat abdominal x-ray 2 views in order to follow her SBO; clamp NG tube and decide  based on her abdominal x-ray results further treatment. Continue n.p.o. diet for now while waiting studies results.  2-Pulmonary hypertension and RHF (right heart failure): continue current management. Well compensated. Cardiology on board we'll follow the recommendations.  3-Hypokalemia: Repleted.  4-Left hydronephrosis: appears to be chronic 2/2 to bypass into left leg.; Patient with good urination and without any left flank pain. No further workup at this point.  5-Dirty urine: no dysuria; cx pending; negative nitrite. Will tx if culture reports infection.  6-DVT: SCD's...    LOS: 3 days   Grabiel Schmutz [facility] [phone]  05/07/2011, 2:38 PM

## 2011-05-07 NOTE — Progress Notes (Signed)
Pt was scheduled for another xray today spoke with Dr Gwenlyn Perking and was advised no second xray needed for today will be scheduled for tomorrow 05/08/11. Annitta Needs, RN

## 2011-05-08 ENCOUNTER — Inpatient Hospital Stay (HOSPITAL_COMMUNITY): Payer: PRIVATE HEALTH INSURANCE

## 2011-05-08 LAB — CBC
HCT: 32 % — ABNORMAL LOW (ref 36.0–46.0)
Hemoglobin: 10.4 g/dL — ABNORMAL LOW (ref 12.0–15.0)
RDW: 13.8 % (ref 11.5–15.5)
WBC: 6.1 10*3/uL (ref 4.0–10.5)

## 2011-05-08 LAB — BASIC METABOLIC PANEL
BUN: 5 mg/dL — ABNORMAL LOW (ref 6–23)
Chloride: 104 mEq/L (ref 96–112)
GFR calc Af Amer: 66 mL/min — ABNORMAL LOW (ref >90)
GFR calc non Af Amer: 57 mL/min — ABNORMAL LOW (ref >90)
Glucose, Bld: 92 mg/dL (ref 70–99)
Potassium: 2.8 mEq/L — ABNORMAL LOW (ref 3.5–5.1)
Sodium: 141 mEq/L (ref 135–145)

## 2011-05-08 LAB — GLUCOSE, CAPILLARY
Glucose-Capillary: 101 mg/dL — ABNORMAL HIGH (ref 70–99)
Glucose-Capillary: 104 mg/dL — ABNORMAL HIGH (ref 70–99)

## 2011-05-08 MED ORDER — POTASSIUM CHLORIDE CRYS ER 20 MEQ PO TBCR
40.0000 meq | EXTENDED_RELEASE_TABLET | ORAL | Status: AC
  Administered 2011-05-08: 40 meq via ORAL
  Filled 2011-05-08: qty 2

## 2011-05-08 MED ORDER — PHENOL 1.4 % MT LIQD
1.0000 | OROMUCOSAL | Status: DC | PRN
  Administered 2011-05-08 – 2011-05-09 (×3): 1 via OROMUCOSAL
  Filled 2011-05-08: qty 177

## 2011-05-08 NOTE — Progress Notes (Signed)
NG tube removed. Pt tolerated removal  well.

## 2011-05-08 NOTE — Progress Notes (Signed)
Subjective: No heart failure symptoms, no chest pain, no shortness of breath. Patient denies abdominal pain, nausea/vomiting. She reports to good bowel movements overnight. Patient potassium 2.8 this morning. Abdominal X-ray demonstrated stable to partially improve SBO; patient reports Appetite and will like NG tube out. Of note were able to remove O2 2 L of brownish/greenish material after NG tube was placed over the last 40 hours.  Objective: Vital signs in last 24 hours: Temp:  [97.3 F (36.3 C)-98.3 F (36.8 C)] 98.1 F (36.7 C) Jun 05, 2022 1335) Pulse Rate:  [92-98] 97  06-05-2022 1335) Resp:  [18-20] 18  05-Jun-2022 1335) BP: (130-174)/(76-94) 138/91 mmHg 06/05/22 1335) SpO2:  [96 %-99 %] 98 % 06-05-22 1335) Weight change: -2.46 kg (-5 lb 6.8 oz) Last BM Date: 05/07/11  Intake/Output from previous day: 01/21 0701 - 2022/06/05 0700 In: 622.2 [I.V.:622.2] Out: 1701 [Urine:1; Emesis/NG output:1700] Total I/O In: 180 [P.O.:180] Out: 150 [Emesis/NG output:150]  Physical Exam: General: Alert, awake, oriented x3, in no acute distress. HEENT: No bruits, no goiter. No JVD Heart: Regular rate and rhythm, positive systolic murmurs, no rubs, no gallops. Lungs: Clear to auscultation bilaterally. Abdomen: Soft, improved tenderness in her abdomen, non-distended, positive bowel sounds. Extremities: No clubbing, cyanosis or edema with positive pedal pulses. Neuro: Grossly intact, nonfocal.   Lab Results: Basic Metabolic Panel:  Basename 06/06/11 0500 05/06/11 0928 05/06/11 0500  NA 141 -- 140  K 2.8* -- 4.1  CL 104 -- 103  CO2 28 -- 24  GLUCOSE 92 -- 42*  BUN 5* -- 17  CREATININE 0.95 -- 1.18*  CALCIUM 8.4 -- 8.3*  MG -- 1.9 --  PHOS -- -- --   CBC:  Basename 06-06-2011 0500  WBC 6.1  NEUTROABS --  HGB 10.4*  HCT 32.0*  MCV 86.5  PLT 212    Studies/Results: Dg Abd 2 Views  06/06/11  *RADIOLOGY REPORT*  Clinical Data: Follow up small bowel obstruction pattern  ABDOMEN - 2 VIEW  Comparison:  05/07/2011  Findings: The nasogastric tube tip is in the stomach with side port below GE junction.  Barium is again noted within the left colon and rectum.  No air-fluid levels identified.  Abnormal small bowel dilatation is stable to improved in the interval.  IMPRESSION:  1.  Stable to improved partial small bowel obstruction.  Original Report Authenticated By: [name], [name]   Dg Abd 2 Views  05/07/2011  *RADIOLOGY REPORT*  Clinical Data: Small bowel obstruction.  ABDOMEN - 2 VIEW  Comparison: Radiographs dated 05/06/2011 and CT scan dated 05/04/2011  Findings: NG tube tip is in the distal stomach.  There are persistent dilated loops of proximal small bowel without appreciable change since the prior exam of 05/06/2011.  Contrast from the prior CT scan has passed into the nondistended colon indicating that the obstruction is only partial.  No free air.  Chronic interstitial lung disease.  IMPRESSION: Persistent partial small bowel obstruction, essentially unchanged since 05/06/2011  Original Report Authenticated By: [name], [name]    Medications: Scheduled Meds:    . amiodarone  200 mg Oral Daily  . antiseptic oral rinse  15 mL Mouth Rinse q12n4p  . aspirin EC  81 mg Oral Daily  . chlorhexidine  15 mL Mouth Rinse BID  . digoxin  62.5 mcg Oral Daily  . enoxaparin (LOVENOX) injection  30 mg Subcutaneous Q24H  . insulin aspart  0-9 Units Subcutaneous Q4H  . levothyroxine  100 mcg Oral Q0700  . magnesium  oxide  400 mg Oral Daily  . multivitamins ther. w/minerals  1 tablet Oral Daily  . pantoprazole  40 mg Oral Daily  . potassium chloride  40 mEq Oral Q4H  . simvastatin  20 mg Oral q1800  . Tadalafil (PAH)  20 mg Oral Daily   Continuous Infusions:    . dextrose 5 % and 0.9% NaCl 50 mL/hr at 05/07/11 1659  . milrinone 0.25 mcg/kg/min (05/07/11 1149)   PRN Meds:.acetaminophen, acetaminophen, alum & mag hydroxide-simeth, HYDROmorphone, morphine injection, ondansetron (ZOFRAN)  IV, ondansetron, oxyCODONE, sodium chloride, sodium chloride, traMADol, zolpidem  Assessment/Plan: 1-SBO: high grade most likely due to adhesions. Today with improvement in the x-ray, abdomen not tender at all and softer in comparison to just had a physical exam. Patient is not having any abdominal pain, or any nausea/vomiting. At this point we'll remove NG tube; advance diet to clear liquid and follow the patient's symptoms.   2-Pulmonary hypertension and RHF (right heart failure): continue current management. Well compensated. Cardiology on board we'll follow the recommendations.  3-Hypokalemia: Do to poor by mouth intake and NG tube intermittent suction. Will replete potassium. Follow BMET in a.m.  4-Left hydronephrosis: appears to be chronic 2/2 to bypass into left leg.; Patient with good urination and without any left flank pain. No further workup at this point.  5-DVT: SCD's...  The rest of her medical problems remains stable. At this point no further changes on her medications has been made.    LOS: 4 days   Katy Brickell [facility] [phone]  05/08/2011, 3:54 PM

## 2011-05-08 NOTE — Progress Notes (Signed)
Pt refusing to take the Tadalafil 20mg  tonight. She states that she takes 1/2 a pill at home and doesn't want to take the whole pill that is ordered here. Pharmacy notified

## 2011-05-08 NOTE — Progress Notes (Signed)
Subjective:   Kelly Hart is a 78 y/o woman with COPD and severe right heart failure on home milrinone. Presents to ER with 1-2 weeks of intermittent nausea, ab pain and poor po intake. No HF symptoms. On exams no evidence of fluid overload. Labs within normal limits except for UTI. CT abdomen with prominent SBO.  Feels ok. No ab pain/n/v. KUB today stable to partially improved SBO. Wants NG tube out. +BMs.  No HF symptoms. No weight on chart today.   Intake/Output Summary (Last 24 hours) at 05/08/11 1507 Last data filed at 05/08/11 1300  Gross per 24 hour  Intake 802.15 ml  Output    850 ml  Net -47.85 ml    Current meds:    . amiodarone  200 mg Oral Daily  . antiseptic oral rinse  15 mL Mouth Rinse q12n4p  . aspirin EC  81 mg Oral Daily  . chlorhexidine  15 mL Mouth Rinse BID  . digoxin  62.5 mcg Oral Daily  . enoxaparin (LOVENOX) injection  30 mg Subcutaneous Q24H  . insulin aspart  0-9 Units Subcutaneous Q4H  . levothyroxine  100 mcg Oral Q0700  . magnesium oxide  400 mg Oral Daily  . multivitamins ther. w/minerals  1 tablet Oral Daily  . pantoprazole  40 mg Oral Daily  . potassium chloride  40 mEq Oral Q4H  . simvastatin  20 mg Oral q1800  . Tadalafil (PAH)  20 mg Oral Daily   Infusions:    . dextrose 5 % and 0.9% NaCl 50 mL/hr at 05/07/11 1659  . milrinone 0.25 mcg/kg/min (05/07/11 1149)     Objective:  Blood pressure 138/91, pulse 97, temperature 98.1 F (36.7 C), temperature source Oral, resp. rate 18, height 5\' 4"  (1.626 m), weight 42.9 kg (94 lb 9.2 oz), SpO2 98.00%. Weight change: -2.46 kg (-5 lb 6.8 oz)  Physical Exam: General:  Thin. No resp difficulty HEENT: normal Neck: supple. JVP 9 . Carotids 2+ bilat; no bruits. No lymphadenopathy or thryomegaly appreciated. Cor: RRR. 2/6 TR. Lungs: clear Abdomen: soft, nontender, nondistended. No hepatosplenomegaly. + bruits or masses. Mild bowel sounds. Extremities: no cyanosis, clubbing, rash, edema Neuro:  alert & orientedx3, cranial nerves grossly intact. moves all 4 extremities w/o difficulty. Affect pleasant    Lab Results: Basic Metabolic Panel:  Lab 05/08/11 4540 05/06/11 0928 05/06/11 0500 05/05/11 0630 05/04/11 1719  NA 141 -- 140 138 141  K 2.8* -- 4.1 -- --  CL 104 -- 103 98 95*  CO2 28 -- 24 31 36*  GLUCOSE 92 -- 42* 71 96  BUN 5* -- 17 15 18   CREATININE 0.95 -- 1.18* 1.27* 1.44*  CALCIUM 8.4 -- 8.3* 8.3* 9.6  MG -- 1.9 -- -- --  PHOS -- -- -- -- --   Liver Function Tests:  Lab 05/04/11 1719  AST 26  ALT 12  ALKPHOS 88  BILITOT 0.5  PROT 7.3  ALBUMIN 3.7    Lab 05/04/11 1719  LIPASE 24  AMYLASE --   No results found for this basename: AMMONIA:5 in the last 168 hours CBC:  Lab 05/08/11 0500 05/05/11 0630 05/04/11 1719  WBC 6.1 6.6 6.5  NEUTROABS -- -- 4.9  HGB 10.4* 10.2* 11.8*  HCT 32.0* 32.6* 37.0  MCV 86.5 88.6 88.5  PLT 212 197 226   Cardiac Enzymes: No results found for this basename: CKTOTAL:5,CKMB:5,CKMBINDEX:5,TROPONINI:5 in the last 168 hours BNP: No components found with this basename: POCBNP:5 CBG:  Lab 05/08/11 1147  05/08/11 0807 05/08/11 0347 05/07/11 2339 05/07/11 2004  GLUCAP 103* 104* 101* 96 90   Microbiology: Lab Results  Component Value Date   CULT Multiple bacterial morphotypes present, none predominant. Suggest appropriate recollection if clinically indicated. 05/04/2011    Lab 05/04/11 1619  CULT Multiple bacterial morphotypes present, none predominant. Suggest appropriate recollection if clinically indicated.  SDES URINE, CLEAN CATCH    Imaging: Dg Abd 2 Views  05/08/2011  *RADIOLOGY REPORT*  Clinical Data: Follow up small bowel obstruction pattern  ABDOMEN - 2 VIEW  Comparison: 05/07/2011  Findings: The nasogastric tube tip is in the stomach with side port below GE junction.  Barium is again noted within the left colon and rectum.  No air-fluid levels identified.  Abnormal small bowel dilatation is stable to improved in  the interval.  IMPRESSION:  1.  Stable to improved partial small bowel obstruction.  Original Report Authenticated By: [name], [name]   Dg Abd 2 Views  05/07/2011  *RADIOLOGY REPORT*  Clinical Data: Small bowel obstruction.  ABDOMEN - 2 VIEW  Comparison: Radiographs dated 05/06/2011 and CT scan dated 05/04/2011  Findings: NG tube tip is in the distal stomach.  There are persistent dilated loops of proximal small bowel without appreciable change since the prior exam of 05/06/2011.  Contrast from the prior CT scan has passed into the nondistended colon indicating that the obstruction is only partial.  No free air.  Chronic interstitial lung disease.  IMPRESSION: Persistent partial small bowel obstruction, essentially unchanged since 05/06/2011  Original Report Authenticated By: [name], [name]     ASSESSMENT:  1) SBO 2) Severe right heart failure on home milrinone and Adcirca 3) COPD. 4) PAD s/p aortobifem graft 5) CRI, stage III-IV 6) DM2, with symptomatic hypoglycemia  PLAN/DISCUSSION:  Doing ok. KUB suggest possible mild improvement in SBO. Would try and ambulate. Continue careful IVFs. Appreciate Dr. Ozella Almond care.  If she needs surgery as last option would be at high-risk but not prohibitive.  I will follow. Please call me with questions.    LOS: 4 days    [name], MD 05/08/2011, 3:07 PM

## 2011-05-09 LAB — BASIC METABOLIC PANEL
BUN: 5 mg/dL — ABNORMAL LOW (ref 6–23)
CO2: 27 mEq/L (ref 19–32)
Calcium: 8.3 mg/dL — ABNORMAL LOW (ref 8.4–10.5)
Creatinine, Ser: 1.07 mg/dL (ref 0.50–1.10)
GFR calc non Af Amer: 49 mL/min — ABNORMAL LOW (ref >90)
Glucose, Bld: 96 mg/dL (ref 70–99)
Sodium: 139 mEq/L (ref 135–145)

## 2011-05-09 LAB — GLUCOSE, CAPILLARY
Glucose-Capillary: 97 mg/dL (ref 70–99)
Glucose-Capillary: 102 mg/dL — ABNORMAL HIGH (ref 70–99)
Glucose-Capillary: 110 mg/dL — ABNORMAL HIGH (ref 70–99)
Glucose-Capillary: 116 mg/dL — ABNORMAL HIGH (ref 70–99)

## 2011-05-09 LAB — TSH: TSH: 4.383 u[IU]/mL (ref 0.350–4.500)

## 2011-05-09 MED ORDER — INSULIN ASPART 100 UNIT/ML ~~LOC~~ SOLN: 0.0000 [IU] | Freq: Every day | SUBCUTANEOUS | Status: DC

## 2011-05-09 MED ORDER — POTASSIUM CHLORIDE CRYS ER 20 MEQ PO TBCR
40.0000 meq | EXTENDED_RELEASE_TABLET | Freq: Once | ORAL | Status: AC
  Administered 2011-05-09: 40 meq via ORAL
  Filled 2011-05-09: qty 2

## 2011-05-09 MED ORDER — INSULIN ASPART 100 UNIT/ML ~~LOC~~ SOLN
0.0000 [IU] | Freq: Three times a day (TID) | SUBCUTANEOUS | Status: DC
  Filled 2011-05-09: qty 3

## 2011-05-09 NOTE — Progress Notes (Signed)
Utilization Review Completed.Kelly Hart T12/06/2011   

## 2011-05-09 NOTE — Progress Notes (Signed)
   CARE MANAGEMENT NOTE 05/09/2011  Patient:  CAITLINN, KLINKER   Account Number:  [id number]  Date Initiated:  05/07/2011  Documentation initiated by:  Junius Creamer  Subjective/Objective Assessment:   adm w chf     Action/Plan:   lives alone, good fam support, pcp dr Lia Hopping   Anticipated DC Date:  05/09/2011   Anticipated DC Plan:  HOME W HOME HEALTH SERVICES      DC Planning Services  CM consult      [facility] Choice  Resumption Of Svcs/PTA Provider   Choice offered to / List presented to:          [facility] arranged  HH-1 RN  HH-10 DISEASE MANAGEMENT      HH agency  [facility]   Status of service:   Medicare Important Message given?   (If response is "NO", the following Medicare IM given date fields will be blank) Date Medicare IM given:   Date Additional Medicare IM given:    Discharge Disposition:  HOME W HOME HEALTH SERVICES  Per UR Regulation:  Reviewed for med. necessity/level of care/duration of stay  Comments:  1/21 debbie Mailyn Steichen rn,bsn [phone]

## 2011-05-09 NOTE — Progress Notes (Signed)
Subjective: Comfortable. Reports 3 BM since am.   Objective: Weight change: 0.4 kg (14.1 oz)  Intake/Output Summary (Last 24 hours) at 05/09/11 1402 Last data filed at 05/09/11 1300  Gross per 24 hour  Intake    600 ml  Output    352 ml  Net    248 ml    Filed Vitals:   05/09/11 0424  BP:   Pulse:   Temp: 99.2 F (37.3 C)  Resp:    Physical Exam:  General: Alert, awake, oriented x3, in no acute distress.  HEENT: No bruits, no goiter. No JVD  Heart: Regular rate and rhythm, positive systolic murmurs, no rubs, no gallops.  Lungs: Clear to auscultation bilaterally.  Abdomen: Soft,no tenderness, non-distended, positive bowel sounds.  Extremities: No clubbing, cyanosis or edema with positive pedal pulses.  Neuro: Grossly intact, nonfocal.   Lab Results: Results for orders placed during the hospital encounter of 05/04/11 (from the past 24 hour(s))  GLUCOSE, CAPILLARY     Status: Abnormal   Collection Time   05/08/11  4:18 PM      Component Value Range   Glucose-Capillary 107 (*) 70 - 99 (mg/dL)  GLUCOSE, CAPILLARY     Status: Abnormal   Collection Time   05/08/11  9:02 PM      Component Value Range   Glucose-Capillary 138 (*) 70 - 99 (mg/dL)   Comment 1 Notify RN    GLUCOSE, CAPILLARY     Status: Abnormal   Collection Time   05/09/11 12:41 AM      Component Value Range   Glucose-Capillary 103 (*) 70 - 99 (mg/dL)   Comment 1 Notify RN    GLUCOSE, CAPILLARY     Status: Normal   Collection Time   05/09/11  4:18 AM      Component Value Range   Glucose-Capillary 97  70 - 99 (mg/dL)   Comment 1 Notify RN    BASIC METABOLIC PANEL     Status: Abnormal   Collection Time   05/09/11  4:25 AM      Component Value Range   Sodium 139  135 - 145 (mEq/L)   Potassium 3.4 (*) 3.5 - 5.1 (mEq/L)   Chloride 105  96 - 112 (mEq/L)   CO2 27  19 - 32 (mEq/L)   Glucose, Bld 96  70 - 99 (mg/dL)   BUN 5 (*) 6 - 23 (mg/dL)   Creatinine, Ser 1.61  0.50 - 1.10 (mg/dL)   Calcium 8.3 (*) 8.4  - 10.5 (mg/dL)   GFR calc non Af Amer 49 (*) >90 (mL/min)   GFR calc Af Amer 57 (*) >90 (mL/min)  GLUCOSE, CAPILLARY     Status: Abnormal   Collection Time   05/09/11  7:28 AM      Component Value Range   Glucose-Capillary 102 (*) 70 - 99 (mg/dL)  GLUCOSE, CAPILLARY     Status: Abnormal   Collection Time   05/09/11 11:19 AM      Component Value Range   Glucose-Capillary 110 (*) 70 - 99 (mg/dL)   Comment 1 Notify RN      Micro Results: Recent Results (from the past 240 hour(s))  URINE CULTURE     Status: Normal   Collection Time   05/04/11  4:19 PM      Component Value Range Status Comment   Specimen Description URINE, CLEAN CATCH   Final    Special Requests ADDED 05/04/11 2016   Final  Setup Time [id number]   Final    Colony Count 75,000 COLONIES/ML   Final    Culture     Final    Value: Multiple bacterial morphotypes present, none predominant. Suggest appropriate recollection if clinically indicated.   Report Status 05/05/2011 FINAL   Final     Studies/Results: Ct Abdomen Pelvis Wo Contrast  05/04/2011  *RADIOLOGY REPORT*  Clinical Data: Nausea, abdominal pain, poor PO intake  CT ABDOMEN AND PELVIS WITHOUT CONTRAST  Technique:  Multidetector CT imaging of the abdomen and pelvis was performed following the standard protocol without intravenous contrast.  Comparison:  Imaging CT abdomen/pelvis dated 01/17/2009  Findings: Chronic interstitial markings/subpleural reticulation at the lung bases.  Unenhanced liver, spleen, pancreas, and adrenal glands are within normal limits.  Status post cholecystectomy.  No intrahepatic ductal dilatation. Stable common bile duct.  Right kidney is unremarkable.  Moderate left hydronephrosis.  No renal calculi.  Multiple dilated loops of proximal small bowel with abrupt narrowing/transition in the right mid abdomen (series 2/image 40). Distal small bowel/colon are decompressed.  This appearance is compatible with high-grade partial or complete  small bowel obstruction.  Colonic diverticulosis, without associated inflammatory changes.  Atherosclerotic calcifications of the abdominal aorta and branch vessels.  Aortobifemoral bypass.  No abdominopelvic ascites.  No suspicious abdominopelvic lymphadenopathy.  Uterus is notable for calcification of the periuterine vessels.  No adnexal masses.  Proximal left hydroureter, which it abruptly narrows at the level of the crossing left femoral bypass graft (series 2/image 48).  No bladder calculi.  Mild degenerative changes of the visualized thoracolumbar spine. Sclerosis from prior bilateral sacral insufficiency fractures (series 2/image 55).  IMPRESSION: High-grade partial or complete small bowel obstruction with transition in the right mid abdomen.  Moderate left hydronephrosis with proximal left hydroureter, likely on the basis of extrinsic compression secondary to crossing left femoral bypass graft.  Additional ancillary findings as above.  Original Report Authenticated By: Charline Bills, [name]   Dg Abd 2 Views  05/08/2011  *RADIOLOGY REPORT*  Clinical Data: Follow up small bowel obstruction pattern  ABDOMEN - 2 VIEW  Comparison: 05/07/2011  Findings: The nasogastric tube tip is in the stomach with side port below GE junction.  Barium is again noted within the left colon and rectum.  No air-fluid levels identified.  Abnormal small bowel dilatation is stable to improved in the interval.  IMPRESSION:  1.  Stable to improved partial small bowel obstruction.  Original Report Authenticated By: [name], [name]   Dg Abd 2 Views  05/07/2011  *RADIOLOGY REPORT*  Clinical Data: Small bowel obstruction.  ABDOMEN - 2 VIEW  Comparison: Radiographs dated 05/06/2011 and CT scan dated 05/04/2011  Findings: NG tube tip is in the distal stomach.  There are persistent dilated loops of proximal small bowel without appreciable change since the prior exam of 05/06/2011.  Contrast from the prior CT scan has passed into  the nondistended colon indicating that the obstruction is only partial.  No free air.  Chronic interstitial lung disease.  IMPRESSION: Persistent partial small bowel obstruction, essentially unchanged since 05/06/2011  Original Report Authenticated By: [name], [name]   Dg Abd 2 Views  05/06/2011  *RADIOLOGY REPORT*  Clinical Data: Nausea, small bowel obstruction  ABDOMEN - 2 VIEW  Comparison: CT abdomen pelvis dated 05/04/2011  Findings: Dilated loops of small bowel in the lower abdomen. Residual contrast within decompressed loops of colon.  These findings suggest high-grade partial small bowel obstruction.  Colonic diverticulosis.  No  evidence of free air on the lateral decubitus view.  Cholecystectomy clips.  Degenerative changes of the visualized thoracolumbar spine.  IMPRESSION: Dilated loops of small bowel with residual contrast within decompressed loops of colon, suggesting high-grade partial small bowel obstruction.  No free air.  Original Report Authenticated By: Charline Bills, [name]   Medications: Scheduled Meds:   . amiodarone  200 mg Oral Daily  . antiseptic oral rinse  15 mL Mouth Rinse q12n4p  . aspirin EC  81 mg Oral Daily  . chlorhexidine  15 mL Mouth Rinse BID  . digoxin  62.5 mcg Oral Daily  . enoxaparin (LOVENOX) injection  30 mg Subcutaneous Q24H  . insulin aspart  0-5 Units Subcutaneous QHS  . insulin aspart  0-9 Units Subcutaneous TID WC  . levothyroxine  100 mcg Oral Q0700  . multivitamins ther. w/minerals  1 tablet Oral Daily  . pantoprazole  40 mg Oral Daily  . potassium chloride  40 mEq Oral Q4H  . potassium chloride  40 mEq Oral Once  . potassium chloride  40 mEq Oral Once  . simvastatin  20 mg Oral q1800  . Tadalafil (PAH)  20 mg Oral Daily  . DISCONTD: insulin aspart  0-9 Units Subcutaneous Q4H  . DISCONTD: magnesium oxide  400 mg Oral Daily   Continuous Infusions:   . dextrose 5 % and 0.9% NaCl 50 mL/hr (05/09/11 1008)  . milrinone 0.25 mcg/kg/min  (05/08/11 2055)   PRN Meds:.acetaminophen, acetaminophen, alum & mag hydroxide-simeth, HYDROmorphone, ondansetron (ZOFRAN) IV, ondansetron, oxyCODONE, phenol, sodium chloride, sodium chloride, traMADol, zolpidem, DISCONTD:  morphine injection  Assessment/Plan: 1-SBO: high grade most likely due to adhesions.  Patient is not having any abdominal pain, or any nausea/vomiting.  Advance diet to full liquid diet and follow the patient's symptoms.  2-Pulmonary hypertension and RHF (right heart failure): continue current management. Well compensated. Cardiology on board we'll follow the recommendations.  3-Hypokalemia:replete potassium as needed. Follow BMET in a.m.  4-Left hydronephrosis: appears to be chronic 2/2 to bypass into left leg.; Patient with good urination and without any left flank pain. No further workup at this point.  5. Diarrhea: loose BM over an year. She had a colonoscopy in the past was told within normal limits. Ct abd on admission shows diverticulosis without evidence of diverticulitis. Will check TSH, as she on thyroid supplements and amiodarone. Continue to monitor.  5-DVT: SCD's...   LOS: 5 days   Kelly Hart 05/09/2011, 2:02 PM

## 2011-05-09 NOTE — Progress Notes (Signed)
Physical Therapy Evaluation Patient Details Name: Kelly Hart MRN: [id number] DOB: 1932/12/06 Today's Date: 05/09/2011  Problem List:  Patient Active Problem List  Diagnoses  . Pulmonary hypertension  . PAD (peripheral artery disease)  . RHF (right heart failure)    Past Medical History:  Past Medical History  Diagnosis Date  . Hypertension   . Renal insufficiency     baseline around cr 1.3  . Atrial fibrillation   . COPD (chronic obstructive pulmonary disease)   . CHF (congestive heart failure)     diastolic  . PEA (Pulseless electrical activity) Oct 2012  . PAD (peripheral artery disease)   . Hyperlipidemia   . Hypothyroidism   . History of cervical cancer   . Chronic diarrhea     secondary to treatment of her cervical cancer  . Hydronephrosis, left   . Pulmonary hypertension   . GERD (gastroesophageal reflux disease)   . Myocardial infarction    Past Surgical History:  Past Surgical History  Procedure Date  . Aortobifem bypass grafting   . Open thrombectomy Oct 2012  . Pr vein bypass graft,aorto-fem-pop 02/04/2009    PT Assessment/Plan/Recommendation PT Assessment Clinical Impression Statement: Pt is 78 yo female who presented with SBO and is limited in activity by SOB and O2 desaturation.  Recommend acute PT to progress mobility and monitor O2 sats.  Do not expect pt to need f/u after d/c. PT Recommendation/Assessment: Patient will need skilled PT in the acute care venue PT Problem List: Decreased activity tolerance;Decreased mobility;Cardiopulmonary status limiting activity Barriers to Discharge: None PT Therapy Diagnosis : Difficulty walking PT Plan PT Frequency: Min 3X/week PT Treatment/Interventions: Gait training;Functional mobility training;Therapeutic activities;Therapeutic exercise;Patient/family education PT Recommendation Follow Up Recommendations: No PT follow up Equipment Recommended: None recommended by PT PT Goals  Acute Rehab PT Goals PT  Goal Formulation: With patient Time For Goal Achievement: 2 weeks Pt will go Sit to Stand: Independently PT Goal: Sit to Stand - Progress: Goal set today Pt will go Stand to Sit: Independently PT Goal: Stand to Sit - Progress: Goal set today Pt will Ambulate: >150 feet;with modified independence;Other (comment) (with O2 sats >90%) PT Goal: Ambulate - Progress: Goal set today Pt will Go Up / Down Stairs: 1-2 stairs;with modified independence PT Goal: Up/Down Stairs - Progress: Goal set today Pt will Perform Home Exercise Program: Independently PT Goal: Perform Home Exercise Program - Progress: Goal set today  PT Evaluation Precautions/Restrictions  Precautions Required Braces or Orthoses: No Restrictions Weight Bearing Restrictions: No Other Position/Activity Restrictions: watch O2 sats, ambulate with O2 Prior Functioning  Home Living Lives With: Alone;Other (Comment);Daughter (lives alone but is going home with daughter) Dolores Lory Help From: Family Type of Home: House Home Layout: One level Home Access: Stairs to enter Entrance Stairs-Rails: None Entrance Stairs-Number of Steps: 1 Home Adaptive Equipment: Environmental consultant - four wheeled Additional Comments: pt uses O2 for household distances, does not use it within room distance Prior Function Level of Independence: Independent with basic ADLs;Independent with homemaking with ambulation;Independent with gait;Independent with transfers Able to Take Stairs?: Yes Driving: Yes Vocation: Retired Financial risk analyst Arousal/Alertness: Awake/alert Overall Cognitive Status: Appears within functional limits for tasks assessed Orientation Level: Oriented X4 Sensation/Coordination Sensation Light Touch: Appears Intact Stereognosis: Not tested Hot/Cold: Not tested Proprioception: Appears Intact Coordination Gross Motor Movements are Fluid and Coordinated: Yes Fine Motor Movements are Fluid and Coordinated: Yes Extremity Assessment RUE  Assessment RUE Assessment: Within Functional Limits LUE Assessment LUE Assessment: Within Functional Limits RLE Assessment  RLE Assessment: Exceptions to [facility] RLE Strength RLE Overall Strength: Deficits;Due to premorbid status RLE Overall Strength Comments: grossly 4/5 throughout LLE Assessment LLE Assessment: Within Functional Limits Mobility (including Balance) Bed Mobility Bed Mobility: Yes Supine to Sit: 6: Modified independent (Device/Increase time);With rails;HOB elevated (Comment degrees) (30) Sit to Supine: 6: Modified independent (Device/Increase time);HOB elevated (comment degrees);With rail (30) Transfers Transfers: Yes Sit to Stand: 6: Modified independent (Device/Increase time) Stand to Sit: 6: Modified independent (Device/Increase time) Ambulation/Gait Ambulation/Gait: Yes Ambulation/Gait Assistance: 5: Supervision Ambulation/Gait Assistance Details (indicate cue type and reason): pt recently received HHPT for right leg weakness due to circulation/ claudication issues.  Pt ambulates with equal wt right and left but she reports that she can tell she still favors the right.  Pt turns head and changes direction with no LOB.  However, pt winded upon return to room and O2 sats 79% on 2L, returned to the high 90's with rest. Ambulation Distance (Feet): 300 Feet Assistive device: None Gait Pattern: Step-through pattern Gait velocity: WFL Stairs: No Wheelchair Mobility Wheelchair Mobility: No  Posture/Postural Control Posture/Postural Control: No significant limitations Balance Balance Assessed: Yes Dynamic Standing Balance Dynamic Standing - Balance Support: No upper extremity supported Dynamic Standing - Level of Assistance: 6: Modified independent (Device/Increase time) Exercise  General Exercises - Lower Extremity Ankle Circles/Pumps: AROM;Both;10 reps;Supine Heel Slides: AROM;Strengthening;Both;10 reps;Supine Hip ABduction/ADduction: AROM;Both;10  reps;Supine;Strengthening Straight Leg Raises: AROM;Strengthening;Both;10 reps;Supine End of Session PT - End of Session Equipment Utilized During Treatment: Gait belt;Other (comment) (O2) Activity Tolerance: Other (comment) (patient limited by SOB) Patient left: in bed;with call bell in reach;with family/visitor present Nurse Communication: Mobility status for ambulation General Behavior During Session: [facility] for tasks performed Cognition: [facility] for tasks performed  Lyanne Co [phone] 05/09/2011, 4:46 PM

## 2011-05-09 NOTE — Progress Notes (Signed)
Subjective:   Kelly Hart is a 78 y/o woman with COPD and severe right heart failure on home milrinone. Presents to ER with 1-2 weeks of intermittent nausea, ab pain and poor po intake. No HF symptoms. On exams no evidence of fluid overload. Labs within normal limits except for UTI. CT abdomen with prominent SBO.  Feels ok. No ab pain/n/v. KUB today stable to partially improved SBO. NG tube out yesterday. +BMs.  No HF symptoms. Weight fairly stable. (up a few pounds)  Intake/Output Summary (Last 24 hours) at 05/09/11 0724 Last data filed at 05/08/11 1700  Gross per 24 hour  Intake    180 ml  Output    400 ml  Net   -220 ml    Current meds:    . amiodarone  200 mg Oral Daily  . antiseptic oral rinse  15 mL Mouth Rinse q12n4p  . aspirin EC  81 mg Oral Daily  . chlorhexidine  15 mL Mouth Rinse BID  . digoxin  62.5 mcg Oral Daily  . enoxaparin (LOVENOX) injection  30 mg Subcutaneous Q24H  . insulin aspart  0-9 Units Subcutaneous Q4H  . levothyroxine  100 mcg Oral Q0700  . magnesium oxide  400 mg Oral Daily  . multivitamins ther. w/minerals  1 tablet Oral Daily  . pantoprazole  40 mg Oral Daily  . potassium chloride  40 mEq Oral Q4H  . simvastatin  20 mg Oral q1800  . Tadalafil (PAH)  20 mg Oral Daily   Infusions:    . dextrose 5 % and 0.9% NaCl 50 mL/hr at 05/07/11 1659  . milrinone 0.25 mcg/kg/min (05/08/11 2055)     Objective:  Blood pressure 129/69, pulse 91, temperature 99.2 F (37.3 C), temperature source Oral, resp. rate 20, height 5\' 4"  (1.626 m), weight 43.3 kg (95 lb 7.4 oz), SpO2 98.00%. Weight change: 0.4 kg (14.1 oz)  Physical Exam: General:  Thin. No resp difficulty HEENT: normal Neck: supple. JVP 9 . Carotids 2+ bilat; no bruits. No lymphadenopathy or thryomegaly appreciated. Cor: RRR. 2/6 TR. Lungs: clear Abdomen: soft, nontender, nondistended. No hepatosplenomegaly. + bruits or masses. Mild bowel sounds. Extremities: no cyanosis, clubbing, rash,  edema Neuro: alert & orientedx3, cranial nerves grossly intact. moves all 4 extremities w/o difficulty. Affect pleasant    Lab Results: Basic Metabolic Panel:  Lab 05/09/11 1610 05/08/11 0500 05/06/11 0928 05/06/11 0500 05/05/11 0630 05/04/11 1719  NA 139 141 -- 140 138 141  K 3.4* 2.8* -- -- -- --  CL 105 104 -- 103 98 95*  CO2 27 28 -- 24 31 36*  GLUCOSE 96 92 -- 42* 71 96  BUN 5* 5* -- 17 15 18   CREATININE 1.07 0.95 -- 1.18* 1.27* 1.44*  CALCIUM 8.3* 8.4 -- 8.3* 8.3* 9.6  MG -- -- 1.9 -- -- --  PHOS -- -- -- -- -- --   Liver Function Tests:  Lab 05/04/11 1719  AST 26  ALT 12  ALKPHOS 88  BILITOT 0.5  PROT 7.3  ALBUMIN 3.7    Lab 05/04/11 1719  LIPASE 24  AMYLASE --   No results found for this basename: AMMONIA:5 in the last 168 hours CBC:  Lab 05/08/11 0500 05/05/11 0630 05/04/11 1719  WBC 6.1 6.6 6.5  NEUTROABS -- -- 4.9  HGB 10.4* 10.2* 11.8*  HCT 32.0* 32.6* 37.0  MCV 86.5 88.6 88.5  PLT 212 197 226   Cardiac Enzymes: No results found for this basename: CKTOTAL:5,CKMB:5,CKMBINDEX:5,TROPONINI:5 in the  last 168 hours BNP: No components found with this basename: POCBNP:5 CBG:  Lab 05/09/11 0041 05/08/11 2102 05/08/11 1618 05/08/11 1147 05/08/11 0807  GLUCAP 103* 138* 107* 103* 104*   Microbiology: Lab Results  Component Value Date   CULT Multiple bacterial morphotypes present, none predominant. Suggest appropriate recollection if clinically indicated. 05/04/2011    Lab 05/04/11 1619  CULT Multiple bacterial morphotypes present, none predominant. Suggest appropriate recollection if clinically indicated.  SDES URINE, CLEAN CATCH    Imaging: Dg Abd 2 Views  05/08/2011  *RADIOLOGY REPORT*  Clinical Data: Follow up small bowel obstruction pattern  ABDOMEN - 2 VIEW  Comparison: 05/07/2011  Findings: The nasogastric tube tip is in the stomach with side port below GE junction.  Barium is again noted within the left colon and rectum.  No air-fluid levels  identified.  Abnormal small bowel dilatation is stable to improved in the interval.  IMPRESSION:  1.  Stable to improved partial small bowel obstruction.  Original Report Authenticated By: [name], [name]   Dg Abd 2 Views  05/07/2011  *RADIOLOGY REPORT*  Clinical Data: Small bowel obstruction.  ABDOMEN - 2 VIEW  Comparison: Radiographs dated 05/06/2011 and CT scan dated 05/04/2011  Findings: NG tube tip is in the distal stomach.  There are persistent dilated loops of proximal small bowel without appreciable change since the prior exam of 05/06/2011.  Contrast from the prior CT scan has passed into the nondistended colon indicating that the obstruction is only partial.  No free air.  Chronic interstitial lung disease.  IMPRESSION: Persistent partial small bowel obstruction, essentially unchanged since 05/06/2011  Original Report Authenticated By: [name], [name]     ASSESSMENT:  1) SBO 2) Severe right heart failure on home milrinone and Adcirca 3) COPD. 4) PAD s/p aortobifem graft 5) CRI, stage III-IV 6) DM2, with symptomatic hypoglycemia  PLAN/DISCUSSION:  Doing ok. KUB suggest possible mild improvement in SBO. Would try and ambulate. Once taking adequate pos would stop IVFs. Appreciate Dr. Ozella Almond care.  If she needs surgery as last option would be at high-risk but not prohibitive.  I will follow. Please call me with questions.    LOS: 5 days    [name], MD 05/09/2011, 7:24 AM

## 2011-05-10 ENCOUNTER — Inpatient Hospital Stay (HOSPITAL_COMMUNITY): Payer: PRIVATE HEALTH INSURANCE

## 2011-05-10 DIAGNOSIS — I369 Nonrheumatic tricuspid valve disorder, unspecified: Secondary | ICD-10-CM

## 2011-05-10 LAB — CBC
HCT: 29.7 % — ABNORMAL LOW (ref 36.0–46.0)
Hemoglobin: 9.5 g/dL — ABNORMAL LOW (ref 12.0–15.0)
RDW: 14.1 % (ref 11.5–15.5)
WBC: 5.6 10*3/uL (ref 4.0–10.5)

## 2011-05-10 LAB — GLUCOSE, CAPILLARY
Glucose-Capillary: 93 mg/dL (ref 70–99)
Glucose-Capillary: 111 mg/dL — ABNORMAL HIGH (ref 70–99)

## 2011-05-10 LAB — BASIC METABOLIC PANEL
BUN: 5 mg/dL — ABNORMAL LOW (ref 6–23)
Chloride: 110 mEq/L (ref 96–112)
GFR calc Af Amer: 58 mL/min — ABNORMAL LOW (ref >90)
Potassium: 4.4 mEq/L (ref 3.5–5.1)
Sodium: 140 mEq/L (ref 135–145)

## 2011-05-10 MED ORDER — FUROSEMIDE 40 MG PO TABS
40.0000 mg | ORAL_TABLET | Freq: Every day | ORAL | Status: DC
  Administered 2011-05-10: 40 mg via ORAL
  Filled 2011-05-10 (×2): qty 1

## 2011-05-10 NOTE — Progress Notes (Signed)
Subjective:   Kelly Hart is a 78 y/o woman with COPD and severe right heart failure on home milrinone. Presents to ER with 1-2 weeks of intermittent nausea, ab pain and poor po intake. No HF symptoms. On exams no evidence of fluid overload. Labs within normal limits except for UTI. CT abdomen with prominent SBO.  Feels better. Tolerating liquids. Ambulated well with PT though sats dropped into the 70s on 2L.   No HF symptoms. Weight is up 8 pounds from admission.  Intake/Output Summary (Last 24 hours) at 05/10/11 0807 Last data filed at 05/10/11 0542  Gross per 24 hour  Intake    840 ml  Output    653 ml  Net    187 ml    Current meds:    . amiodarone  200 mg Oral Daily  . antiseptic oral rinse  15 mL Mouth Rinse q12n4p  . aspirin EC  81 mg Oral Daily  . digoxin  62.5 mcg Oral Daily  . enoxaparin (LOVENOX) injection  30 mg Subcutaneous Q24H  . insulin aspart  0-5 Units Subcutaneous QHS  . insulin aspart  0-9 Units Subcutaneous TID WC  . levothyroxine  100 mcg Oral Q0700  . multivitamins ther. w/minerals  1 tablet Oral Daily  . pantoprazole  40 mg Oral Daily  . potassium chloride  40 mEq Oral Once  . potassium chloride  40 mEq Oral Once  . simvastatin  20 mg Oral q1800  . Tadalafil (PAH)  20 mg Oral Daily  . DISCONTD: chlorhexidine  15 mL Mouth Rinse BID  . DISCONTD: insulin aspart  0-9 Units Subcutaneous Q4H  . DISCONTD: magnesium oxide  400 mg Oral Daily   Infusions:    . dextrose 5 % and 0.9% NaCl 50 mL/hr at 05/10/11 0613  . milrinone 0.25 mcg/kg/min (05/10/11 0154)     Objective:  Blood pressure 148/82, pulse 89, temperature 98.5 F (36.9 C), temperature source Oral, resp. rate 18, height 5\' 4"  (1.626 m), weight 45.314 kg (99 lb 14.4 oz), SpO2 100.00%. Weight change: 2.014 kg (4 lb 7.1 oz)  Physical Exam: General:  Thin. No resp difficulty HEENT: normal Neck: supple. JVP 10 Carotids 2+ bilat; no bruits. No lymphadenopathy or thryomegaly appreciated. Cor: RRR.  2/6 TR. Lungs: clear Abdomen: soft, nontender, nondistended. No hepatosplenomegaly. + bruits or masses. Mild bowel sounds. Extremities: no cyanosis, clubbing, rash, edema Neuro: alert & orientedx3, cranial nerves grossly intact. moves all 4 extremities w/o difficulty. Affect pleasant    Lab Results: Basic Metabolic Panel:  Lab 05/09/11 1610 05/08/11 0500 05/06/11 0928 05/06/11 0500 05/05/11 0630 05/04/11 1719  NA 139 141 -- 140 138 141  K 3.4* 2.8* -- -- -- --  CL 105 104 -- 103 98 95*  CO2 27 28 -- 24 31 36*  GLUCOSE 96 92 -- 42* 71 96  BUN 5* 5* -- 17 15 18   CREATININE 1.07 0.95 -- 1.18* 1.27* 1.44*  CALCIUM 8.3* 8.4 -- 8.3* 8.3* 9.6  MG -- -- 1.9 -- -- --  PHOS -- -- -- -- -- --   Liver Function Tests:  Lab 05/04/11 1719  AST 26  ALT 12  ALKPHOS 88  BILITOT 0.5  PROT 7.3  ALBUMIN 3.7    Lab 05/04/11 1719  LIPASE 24  AMYLASE --   No results found for this basename: AMMONIA:5 in the last 168 hours CBC:  Lab 05/10/11 0619 05/08/11 0500 05/05/11 0630 05/04/11 1719  WBC 5.6 6.1 6.6 6.5  NEUTROABS -- -- --  4.9  HGB 9.5* 10.4* 10.2* 11.8*  HCT 29.7* 32.0* 32.6* 37.0  MCV 87.1 86.5 88.6 88.5  PLT 197 212 197 226   Cardiac Enzymes: No results found for this basename: CKTOTAL:5,CKMB:5,CKMBINDEX:5,TROPONINI:5 in the last 168 hours BNP: No components found with this basename: POCBNP:5 CBG:  Lab 05/10/11 0605 05/09/11 2133 05/09/11 1603 05/09/11 1119 05/09/11 0728  GLUCAP 93 99 116* 110* 102*   Microbiology: Lab Results  Component Value Date   CULT Multiple bacterial morphotypes present, none predominant. Suggest appropriate recollection if clinically indicated. 05/04/2011    Lab 05/04/11 1619  CULT Multiple bacterial morphotypes present, none predominant. Suggest appropriate recollection if clinically indicated.  SDES URINE, CLEAN CATCH    Imaging: Dg Abd 2 Views  05/08/2011  *RADIOLOGY REPORT*  Clinical Data: Follow up small bowel obstruction pattern   ABDOMEN - 2 VIEW  Comparison: 05/07/2011  Findings: The nasogastric tube tip is in the stomach with side port below GE junction.  Barium is again noted within the left colon and rectum.  No air-fluid levels identified.  Abnormal small bowel dilatation is stable to improved in the interval.  IMPRESSION:  1.  Stable to improved partial small bowel obstruction.  Original Report Authenticated By: [name], [name]     ASSESSMENT:  1) SBO 2) Severe right heart failure on home milrinone and Adcirca 3) COPD. 4) PAD s/p aortobifem graft 5) CRI, stage III-IV 6) DM2, with symptomatic hypoglycemia  PLAN/DISCUSSION:  Improving. Tolerating liquid diet. Ambulating well. Weight up several pounds so would stop IVF and give one dose IV lasix.     LOS: 6 days    [name], MD 05/10/2011, 8:07 AM

## 2011-05-10 NOTE — Progress Notes (Signed)
2D Echo completed.  Lasondra Hodgkins Johnson, RDCS 

## 2011-05-10 NOTE — Progress Notes (Signed)
Subjective: Loose bowel movements.  Objective: Weight change: 2.014 kg (4 lb 7.1 oz)  Intake/Output Summary (Last 24 hours) at 05/10/11 1448 Last data filed at 05/10/11 1258  Gross per 24 hour  Intake    480 ml  Output   1252 ml  Net   -772 ml    Filed Vitals:   05/10/11 0537  BP: 148/82  Pulse: 89  Temp: 98.5 F (36.9 C)  Resp: 18   Physical Exam:  General: Alert, awake, oriented x3, in no acute distress.  HEENT: No bruits, no goiter. No JVD  Heart: Regular rate and rhythm, positive systolic murmurs, no rubs, no gallops.  Lungs: Clear to auscultation bilaterally.  Abdomen: Soft,no tenderness, non-distended, positive bowel sounds.  Extremities: No clubbing, cyanosis or edema with positive pedal pulses.  Neuro: Grossly intact, nonfocal.   Lab Results: Results for orders placed during the hospital encounter of 05/04/11 (from the past 24 hour(s))  TSH     Status: Normal   Collection Time   05/09/11  2:52 PM      Component Value Range   TSH 4.383  0.350 - 4.500 (uIU/mL)  T4, FREE     Status: Normal   Collection Time   05/09/11  2:52 PM      Component Value Range   Free T4 1.51  0.80 - 1.80 (ng/dL)  GLUCOSE, CAPILLARY     Status: Abnormal   Collection Time   05/09/11  4:03 PM      Component Value Range   Glucose-Capillary 116 (*) 70 - 99 (mg/dL)   Comment 1 Notify RN    GLUCOSE, CAPILLARY     Status: Normal   Collection Time   05/09/11  9:33 PM      Component Value Range   Glucose-Capillary 99  70 - 99 (mg/dL)   Comment 1 Documented in Chart     Comment 2 Notify RN    GLUCOSE, CAPILLARY     Status: Normal   Collection Time   05/10/11  6:05 AM      Component Value Range   Glucose-Capillary 93  70 - 99 (mg/dL)   Comment 1 Notify RN    CBC     Status: Abnormal   Collection Time   05/10/11  6:19 AM      Component Value Range   WBC 5.6  4.0 - 10.5 (K/uL)   RBC 3.41 (*) 3.87 - 5.11 (MIL/uL)   Hemoglobin 9.5 (*) 12.0 - 15.0 (g/dL)   HCT 16.1 (*) 09.6 - 46.0 (%)   MCV 87.1  78.0 - 100.0 (fL)   MCH 27.9  26.0 - 34.0 (pg)   MCHC 32.0  30.0 - 36.0 (g/dL)   RDW 04.5  40.9 - 81.1 (%)   Platelets 197  150 - 400 (K/uL)  BASIC METABOLIC PANEL     Status: Abnormal   Collection Time   05/10/11  6:19 AM      Component Value Range   Sodium 140  135 - 145 (mEq/L)   Potassium 4.4  3.5 - 5.1 (mEq/L)   Chloride 110  96 - 112 (mEq/L)   CO2 21  19 - 32 (mEq/L)   Glucose, Bld 92  70 - 99 (mg/dL)   BUN 5 (*) 6 - 23 (mg/dL)   Creatinine, Ser 9.14  0.50 - 1.10 (mg/dL)   Calcium 8.4  8.4 - 78.2 (mg/dL)   GFR calc non Af Amer 50 (*) >90 (mL/min)   GFR calc Af Denyse Dago  58 (*) >90 (mL/min)  GLUCOSE, CAPILLARY     Status: Normal   Collection Time   05/10/11 11:50 AM      Component Value Range   Glucose-Capillary 85  70 - 99 (mg/dL)     Micro Results: Recent Results (from the past 240 hour(s))  URINE CULTURE     Status: Normal   Collection Time   05/04/11  4:19 PM      Component Value Range Status Comment   Specimen Description URINE, CLEAN CATCH   Final    Special Requests ADDED 05/04/11 2016   Final    Setup Time [id number]   Final    Colony Count 75,000 COLONIES/ML   Final    Culture     Final    Value: Multiple bacterial morphotypes present, none predominant. Suggest appropriate recollection if clinically indicated.   Report Status 05/05/2011 FINAL   Final     Studies/Results: Ct Abdomen Pelvis Wo Contrast  05/04/2011  *RADIOLOGY REPORT*  Clinical Data: Nausea, abdominal pain, poor PO intake  CT ABDOMEN AND PELVIS WITHOUT CONTRAST  Technique:  Multidetector CT imaging of the abdomen and pelvis was performed following the standard protocol without intravenous contrast.  Comparison: [facility] CT abdomen/pelvis dated 01/17/2009  Findings: Chronic interstitial markings/subpleural reticulation at the lung bases.  Unenhanced liver, spleen, pancreas, and adrenal glands are within normal limits.  Status post cholecystectomy.  No intrahepatic ductal dilatation.  Stable common bile duct.  Right kidney is unremarkable.  Moderate left hydronephrosis.  No renal calculi.  Multiple dilated loops of proximal small bowel with abrupt narrowing/transition in the right mid abdomen (series 2/image 40). Distal small bowel/colon are decompressed.  This appearance is compatible with high-grade partial or complete small bowel obstruction.  Colonic diverticulosis, without associated inflammatory changes.  Atherosclerotic calcifications of the abdominal aorta and branch vessels.  Aortobifemoral bypass.  No abdominopelvic ascites.  No suspicious abdominopelvic lymphadenopathy.  Uterus is notable for calcification of the periuterine vessels.  No adnexal masses.  Proximal left hydroureter, which it abruptly narrows at the level of the crossing left femoral bypass graft (series 2/image 48).  No bladder calculi.  Mild degenerative changes of the visualized thoracolumbar spine. Sclerosis from prior bilateral sacral insufficiency fractures (series 2/image 55).  IMPRESSION: High-grade partial or complete small bowel obstruction with transition in the right mid abdomen.  Moderate left hydronephrosis with proximal left hydroureter, likely on the basis of extrinsic compression secondary to crossing left femoral bypass graft.  Additional ancillary findings as above.  Original Report Authenticated By: Charline Bills, [name]   Dg Abd 2 Views  05/08/2011  *RADIOLOGY REPORT*  Clinical Data: Follow up small bowel obstruction pattern  ABDOMEN - 2 VIEW  Comparison: 05/07/2011  Findings: The nasogastric tube tip is in the stomach with side port below GE junction.  Barium is again noted within the left colon and rectum.  No air-fluid levels identified.  Abnormal small bowel dilatation is stable to improved in the interval.  IMPRESSION:  1.  Stable to improved partial small bowel obstruction.  Original Report Authenticated By: [name], [name]   Dg Abd 2 Views  05/07/2011  *RADIOLOGY REPORT*  Clinical  Data: Small bowel obstruction.  ABDOMEN - 2 VIEW  Comparison: Radiographs dated 05/06/2011 and CT scan dated 05/04/2011  Findings: NG tube tip is in the distal stomach.  There are persistent dilated loops of proximal small bowel without appreciable change since the prior exam of 05/06/2011.  Contrast from the prior CT  scan has passed into the nondistended colon indicating that the obstruction is only partial.  No free air.  Chronic interstitial lung disease.  IMPRESSION: Persistent partial small bowel obstruction, essentially unchanged since 05/06/2011  Original Report Authenticated By: [name], [name]   Dg Abd 2 Views  05/06/2011  *RADIOLOGY REPORT*  Clinical Data: Nausea, small bowel obstruction  ABDOMEN - 2 VIEW  Comparison: CT abdomen pelvis dated 05/04/2011  Findings: Dilated loops of small bowel in the lower abdomen. Residual contrast within decompressed loops of colon.  These findings suggest high-grade partial small bowel obstruction.  Colonic diverticulosis.  No evidence of free air on the lateral decubitus view.  Cholecystectomy clips.  Degenerative changes of the visualized thoracolumbar spine.  IMPRESSION: Dilated loops of small bowel with residual contrast within decompressed loops of colon, suggesting high-grade partial small bowel obstruction.  No free air.  Original Report Authenticated By: Charline Bills, [name]   Medications: Scheduled Meds:   . amiodarone  200 mg Oral Daily  . antiseptic oral rinse  15 mL Mouth Rinse q12n4p  . aspirin EC  81 mg Oral Daily  . digoxin  62.5 mcg Oral Daily  . enoxaparin (LOVENOX) injection  30 mg Subcutaneous Q24H  . furosemide  40 mg Oral Daily  . insulin aspart  0-5 Units Subcutaneous QHS  . insulin aspart  0-9 Units Subcutaneous TID WC  . levothyroxine  100 mcg Oral Q0700  . multivitamins ther. w/minerals  1 tablet Oral Daily  . pantoprazole  40 mg Oral Daily  . potassium chloride  40 mEq Oral Once  . simvastatin  20 mg Oral q1800  .  Tadalafil (PAH)  20 mg Oral Daily  . DISCONTD: chlorhexidine  15 mL Mouth Rinse BID   Continuous Infusions:   . milrinone 0.25 mcg/kg/min (05/10/11 0154)  . DISCONTD: dextrose 5 % and 0.9% NaCl 50 mL/hr at 05/10/11 0613   PRN Meds:.acetaminophen, acetaminophen, alum & mag hydroxide-simeth, HYDROmorphone, ondansetron (ZOFRAN) IV, ondansetron, oxyCODONE, phenol, sodium chloride, sodium chloride, traMADol, zolpidem  Assessment/Plan: 1-SBO: high grade most likely due to adhesions. Patient is not having any abdominal pain, or any nausea/vomiting. Advance diet  And evaluate. Get a repeat abd x ray today.   2-Pulmonary hypertension and RHF (right heart failure): continue current management. Well compensated. Cardiology on board we'll follow the recommendations.  3-Hypokalemia:replete potassium as needed. Follow BMET in a.m.  4-Left hydronephrosis: appears to be chronic 2/2 to bypass into left leg.; Patient with good urination and without any left flank pain. No further workup at this point.  5. Diarrhea: loose BM over an year. She had a colonoscopy 2 years ago and was told within normal limits. Ct abd on admission shows diverticulosis without evidence of diverticulitis. TSH within normal limits.. Continue to monitor.  5-DVT: SCD's...    LOS: 6 days   Kelly Hart 05/10/2011, 2:49 PM

## 2011-05-11 ENCOUNTER — Encounter (HOSPITAL_COMMUNITY): Payer: Medicare Other

## 2011-05-11 ENCOUNTER — Other Ambulatory Visit (HOSPITAL_COMMUNITY): Payer: Medicare Other

## 2011-05-11 DIAGNOSIS — K56609 Unspecified intestinal obstruction, unspecified as to partial versus complete obstruction: Secondary | ICD-10-CM

## 2011-05-11 LAB — GLUCOSE, CAPILLARY
Glucose-Capillary: 103 mg/dL — ABNORMAL HIGH (ref 70–99)
Glucose-Capillary: 119 mg/dL — ABNORMAL HIGH (ref 70–99)
Glucose-Capillary: 136 mg/dL — ABNORMAL HIGH (ref 70–99)
Glucose-Capillary: 96 mg/dL (ref 70–99)

## 2011-05-11 LAB — CBC
HCT: 34 % — ABNORMAL LOW (ref 36.0–46.0)
Hemoglobin: 10.9 g/dL — ABNORMAL LOW (ref 12.0–15.0)
MCHC: 32.1 g/dL (ref 30.0–36.0)
RBC: 3.92 MIL/uL (ref 3.87–5.11)
WBC: 8.9 10*3/uL (ref 4.0–10.5)

## 2011-05-11 LAB — BASIC METABOLIC PANEL
BUN: 8 mg/dL (ref 6–23)
CO2: 29 mEq/L (ref 19–32)
Chloride: 105 mEq/L (ref 96–112)
Glucose, Bld: 99 mg/dL (ref 70–99)
Potassium: 3.7 mEq/L (ref 3.5–5.1)
Sodium: 143 mEq/L (ref 135–145)

## 2011-05-11 LAB — PHOSPHORUS: Phosphorus: 3.1 mg/dL (ref 2.3–4.6)

## 2011-05-11 MED ORDER — TRACE MINERALS CR-CU-MN-SE-ZN 10-1000-500-60 MCG/ML IV SOLN
INTRAVENOUS | Status: AC
  Administered 2011-05-11: 18:00:00 via INTRAVENOUS
  Filled 2011-05-11: qty 2000

## 2011-05-11 MED ORDER — FAT EMULSION 20 % IV EMUL
120.0000 mL | INTRAVENOUS | Status: AC
  Administered 2011-05-11: 120 mL via INTRAVENOUS
  Filled 2011-05-11: qty 200

## 2011-05-11 MED ORDER — MAGNESIUM SULFATE 40 MG/ML IJ SOLN
4.0000 g | Freq: Once | INTRAMUSCULAR | Status: AC
  Administered 2011-05-11: 4 g via INTRAVENOUS
  Filled 2011-05-11: qty 100

## 2011-05-11 MED ORDER — DIGOXIN 0.25 MG/ML IJ SOLN
0.0625 mg | Freq: Every day | INTRAMUSCULAR | Status: DC
  Administered 2011-05-11: 0.625 mg via INTRAVENOUS
  Administered 2011-05-12 – 2011-05-13 (×2): via INTRAVENOUS
  Filled 2011-05-11 (×4): qty 0.5

## 2011-05-11 MED ORDER — PANTOPRAZOLE SODIUM 40 MG IV SOLR
40.0000 mg | INTRAVENOUS | Status: DC
  Filled 2011-05-11: qty 40

## 2011-05-11 MED ORDER — PROMETHAZINE HCL 25 MG/ML IJ SOLN
12.5000 mg | Freq: Four times a day (QID) | INTRAMUSCULAR | Status: DC | PRN
  Administered 2011-05-11 (×2): 12.5 mg via INTRAVENOUS
  Filled 2011-05-11 (×3): qty 1

## 2011-05-11 MED ORDER — DEXTROSE-NACL 5-0.9 % IV SOLN: INTRAVENOUS | Status: AC

## 2011-05-11 MED ORDER — DEXTROSE-NACL 5-0.9 % IV SOLN
INTRAVENOUS | Status: AC
  Administered 2011-05-11: 21:00:00 via INTRAVENOUS

## 2011-05-11 MED ORDER — PANTOPRAZOLE SODIUM 40 MG IV SOLR
40.0000 mg | INTRAVENOUS | Status: DC
  Administered 2011-05-11 – 2011-05-13 (×3): 40 mg via INTRAVENOUS
  Filled 2011-05-11 (×4): qty 40

## 2011-05-11 MED ORDER — LEVOTHYROXINE SODIUM 100 MCG IV SOLR
50.0000 ug | Freq: Every day | INTRAVENOUS | Status: DC
  Administered 2011-05-11: 50 ug via INTRAVENOUS
  Administered 2011-05-12: 11:00:00 via INTRAVENOUS
  Administered 2011-05-13 – 2011-05-14 (×2): 50 ug via INTRAVENOUS
  Filled 2011-05-11 (×4): qty 2.5

## 2011-05-11 MED ORDER — FUROSEMIDE 8 MG/ML PO SOLN
40.0000 mg | Freq: Every day | ORAL | Status: DC
  Administered 2011-05-11 – 2011-05-14 (×4): 40 mg via ORAL
  Filled 2011-05-11 (×4): qty 5

## 2011-05-11 MED ORDER — INSULIN ASPART 100 UNIT/ML ~~LOC~~ SOLN: 0.0000 [IU] | Freq: Four times a day (QID) | SUBCUTANEOUS | Status: DC

## 2011-05-11 MED ORDER — DIGOXIN 0.05 MG/ML PO SOLN
0.0625 mg | Freq: Every day | ORAL | Status: DC
  Filled 2011-05-11: qty 2.5

## 2011-05-11 NOTE — Progress Notes (Signed)
Subjective: Frustrated that she has the NG tube again. No BM since last night.   Objective: Weight change: -1.814 kg (-4 lb)  Intake/Output Summary (Last 24 hours) at 05/11/11 1532 Last data filed at 05/11/11 1400  Gross per 24 hour  Intake  923.9 ml  Output    750 ml  Net  173.9 ml    Filed Vitals:   05/11/11 1400  BP: 112/75  Pulse: 116  Temp: 97.8 F (36.6 C)  Resp: 18   Physical Exam:  General: Alert, awake, oriented x3, in no acute distress.  HEENT: No bruits, no goiter. No JVD  Heart: Regular rate and rhythm, positive systolic murmurs, no rubs, no gallops.  Lungs: Clear to auscultation bilaterally.  Abdomen: Soft, generalised tenderness more in the mid abdomen, decreased bowel sounds.   Extremities: No clubbing, cyanosis or edema with positive pedal pulses.  Neuro: Grossly intact, nonfocal.   Lab Results: Results for orders placed during the hospital encounter of 05/04/11 (from the past 24 hour(s))  GLUCOSE, CAPILLARY     Status: Abnormal   Collection Time   05/10/11  4:36 PM      Component Value Range   Glucose-Capillary 111 (*) 70 - 99 (mg/dL)  GLUCOSE, CAPILLARY     Status: Abnormal   Collection Time   05/10/11  8:55 PM      Component Value Range   Glucose-Capillary 106 (*) 70 - 99 (mg/dL)   Comment 1 Notify RN     Comment 2 Documented in Chart    GLUCOSE, CAPILLARY     Status: Abnormal   Collection Time   05/11/11  6:00 AM      Component Value Range   Glucose-Capillary 103 (*) 70 - 99 (mg/dL)  CBC     Status: Abnormal   Collection Time   05/11/11  9:25 AM      Component Value Range   WBC 8.9  4.0 - 10.5 (K/uL)   RBC 3.92  3.87 - 5.11 (MIL/uL)   Hemoglobin 10.9 (*) 12.0 - 15.0 (g/dL)   HCT 16.1 (*) 09.6 - 46.0 (%)   MCV 86.7  78.0 - 100.0 (fL)   MCH 27.8  26.0 - 34.0 (pg)   MCHC 32.1  30.0 - 36.0 (g/dL)   RDW 04.5  40.9 - 81.1 (%)   Platelets 224  150 - 400 (K/uL)  BASIC METABOLIC PANEL     Status: Abnormal   Collection Time   05/11/11  9:25 AM      Component Value Range   Sodium 143  135 - 145 (mEq/L)   Potassium 3.7  3.5 - 5.1 (mEq/L)   Chloride 105  96 - 112 (mEq/L)   CO2 29  19 - 32 (mEq/L)   Glucose, Bld 99  70 - 99 (mg/dL)   BUN 8  6 - 23 (mg/dL)   Creatinine, Ser 9.14 (*) 0.50 - 1.10 (mg/dL)   Calcium 8.9  8.4 - 78.2 (mg/dL)   GFR calc non Af Amer 37 (*) >90 (mL/min)   GFR calc Af Amer 43 (*) >90 (mL/min)  MAGNESIUM     Status: Abnormal   Collection Time   05/11/11  9:25 AM      Component Value Range   Magnesium 1.4 (*) 1.5 - 2.5 (mg/dL)  PHOSPHORUS     Status: Normal   Collection Time   05/11/11  9:25 AM      Component Value Range   Phosphorus 3.1  2.3 - 4.6 (mg/dL)  GLUCOSE, CAPILLARY     Status: Normal   Collection Time   05/11/11 11:26 AM      Component Value Range   Glucose-Capillary 96  70 - 99 (mg/dL)   Comment 1 Documented in Chart     Comment 2 Notify RN       Micro Results: Recent Results (from the past 240 hour(s))  URINE CULTURE     Status: Normal   Collection Time   05/04/11  4:19 PM      Component Value Range Status Comment   Specimen Description URINE, CLEAN CATCH   Final    Special Requests ADDED 05/04/11 2016   Final    Setup Time [id number]   Final    Colony Count 75,000 COLONIES/ML   Final    Culture     Final    Value: Multiple bacterial morphotypes present, none predominant. Suggest appropriate recollection if clinically indicated.   Report Status 05/05/2011 FINAL   Final     Studies/Results: Ct Abdomen Pelvis Wo Contrast  05/04/2011  *RADIOLOGY REPORT*  Clinical Data: Nausea, abdominal pain, poor PO intake  CT ABDOMEN AND PELVIS WITHOUT CONTRAST  Technique:  Multidetector CT imaging of the abdomen and pelvis was performed following the standard protocol without intravenous contrast.  Comparison: [facility] CT abdomen/pelvis dated 01/17/2009  Findings: Chronic interstitial markings/subpleural reticulation at the lung bases.  Unenhanced liver, spleen, pancreas, and adrenal glands  are within normal limits.  Status post cholecystectomy.  No intrahepatic ductal dilatation. Stable common bile duct.  Right kidney is unremarkable.  Moderate left hydronephrosis.  No renal calculi.  Multiple dilated loops of proximal small bowel with abrupt narrowing/transition in the right mid abdomen (series 2/image 40). Distal small bowel/colon are decompressed.  This appearance is compatible with high-grade partial or complete small bowel obstruction.  Colonic diverticulosis, without associated inflammatory changes.  Atherosclerotic calcifications of the abdominal aorta and branch vessels.  Aortobifemoral bypass.  No abdominopelvic ascites.  No suspicious abdominopelvic lymphadenopathy.  Uterus is notable for calcification of the periuterine vessels.  No adnexal masses.  Proximal left hydroureter, which it abruptly narrows at the level of the crossing left femoral bypass graft (series 2/image 48).  No bladder calculi.  Mild degenerative changes of the visualized thoracolumbar spine. Sclerosis from prior bilateral sacral insufficiency fractures (series 2/image 55).  IMPRESSION: High-grade partial or complete small bowel obstruction with transition in the right mid abdomen.  Moderate left hydronephrosis with proximal left hydroureter, likely on the basis of extrinsic compression secondary to crossing left femoral bypass graft.  Additional ancillary findings as above.  Original Report Authenticated By: Charline Bills, [name]   Dg Abd 2 Views  05/10/2011  *RADIOLOGY REPORT*  Clinical Data: Resolution of small bowel obstruction.  ABDOMEN - 2 VIEW  Comparison: 05/08/2011.  Findings: Compared yesterday's exam, there is more dilation of small bowel loops suggesting progression of small bowel obstruction which may be intermittent.  The nasogastric tube has been removed. Cholecystectomy clips are present in the right upper quadrant. Atherosclerosis is noted.  Stool and bowel gas are present in the rectosigmoid.  Compared yesterday's exam, there has been distal progression of colonic contrast and gas and fluid are present within the proximal colon.  IMPRESSION:  1.  Interval removal of nasogastric tube. 2.  Progressive distention of small bowel loops suggesting persistent partial small bowel obstruction or intermittent obstruction.  Original Report Authenticated By: Andreas Newport, [name]   Dg Abd 2 Views  05/08/2011  *RADIOLOGY REPORT*  Clinical  Data: Follow up small bowel obstruction pattern  ABDOMEN - 2 VIEW  Comparison: 05/07/2011  Findings: The nasogastric tube tip is in the stomach with side port below GE junction.  Barium is again noted within the left colon and rectum.  No air-fluid levels identified.  Abnormal small bowel dilatation is stable to improved in the interval.  IMPRESSION:  1.  Stable to improved partial small bowel obstruction.  Original Report Authenticated By: [name], [name]   Dg Abd 2 Views  05/07/2011  *RADIOLOGY REPORT*  Clinical Data: Small bowel obstruction.  ABDOMEN - 2 VIEW  Comparison: Radiographs dated 05/06/2011 and CT scan dated 05/04/2011  Findings: NG tube tip is in the distal stomach.  There are persistent dilated loops of proximal small bowel without appreciable change since the prior exam of 05/06/2011.  Contrast from the prior CT scan has passed into the nondistended colon indicating that the obstruction is only partial.  No free air.  Chronic interstitial lung disease.  IMPRESSION: Persistent partial small bowel obstruction, essentially unchanged since 05/06/2011  Original Report Authenticated By: [name], [name]   Dg Abd 2 Views  05/06/2011  *RADIOLOGY REPORT*  Clinical Data: Nausea, small bowel obstruction  ABDOMEN - 2 VIEW  Comparison: CT abdomen pelvis dated 05/04/2011  Findings: Dilated loops of small bowel in the lower abdomen. Residual contrast within decompressed loops of colon.  These findings suggest high-grade partial small bowel obstruction.  Colonic  diverticulosis.  No evidence of free air on the lateral decubitus view.  Cholecystectomy clips.  Degenerative changes of the visualized thoracolumbar spine.  IMPRESSION: Dilated loops of small bowel with residual contrast within decompressed loops of colon, suggesting high-grade partial small bowel obstruction.  No free air.  Original Report Authenticated By: Charline Bills, [name]   Medications: Scheduled Meds:   . amiodarone  200 mg Oral Daily  . antiseptic oral rinse  15 mL Mouth Rinse q12n4p  . aspirin EC  81 mg Oral Daily  . digoxin  0.0625 mg Oral Daily  . enoxaparin (LOVENOX) injection  30 mg Subcutaneous Q24H  . furosemide  40 mg Oral Daily  . insulin aspart  0-9 Units Subcutaneous Q6H  . levothyroxine  100 mcg Oral Q0700  . magnesium sulfate 1 - 4 g bolus IVPB  4 g Intravenous Once  . pantoprazole (PROTONIX) IV  40 mg Intravenous Q24H  . simvastatin  20 mg Oral q1800  . Tadalafil (PAH)  20 mg Oral Daily  . DISCONTD: digoxin  62.5 mcg Oral Daily  . DISCONTD: furosemide  40 mg Oral Daily  . DISCONTD: insulin aspart  0-5 Units Subcutaneous QHS  . DISCONTD: insulin aspart  0-9 Units Subcutaneous TID WC  . DISCONTD: multivitamins ther. w/minerals  1 tablet Oral Daily  . DISCONTD: pantoprazole  40 mg Oral Daily  . DISCONTD: pantoprazole (PROTONIX) IV  40 mg Intravenous Q24H   Continuous Infusions:   . dextrose 5 % and 0.9% NaCl    . dextrose 5 % and 0.9% NaCl    . TPN (CLINIMIX) +/- additives     And  . fat emulsion    . milrinone 0.25 mcg/kg/min (05/11/11 0705)   PRN Meds:.acetaminophen, acetaminophen, HYDROmorphone, phenol, promethazine, sodium chloride, sodium chloride, DISCONTD: alum & mag hydroxide-simeth, DISCONTD: ondansetron (ZOFRAN) IV, DISCONTD: ondansetron, DISCONTD: oxyCODONE, DISCONTD: traMADol, DISCONTD: zolpidem  Assessment/Plan:  LOS: 7 days  1-SBO: high grade most likely due to adhesions. She has persistent SBO on x ray, with no BM since yesterday.  Increasing  abdominal pain. Surgery consulted and NG TUBE is placed. Will change all po medication to IV.  2-Pulmonary hypertension and RHF (right heart failure): continue current management. Well compensated. Cardiology on board we'll follow the recommendations.  3-Hypokalemia:replete potassium as needed. Follow BMET in a.m.  4-Left hydronephrosis: appears to be chronic 2/2 to bypass into left leg.; Patient with good urination and without any left flank pain. No further workup at this point.  5. Diarrhea: No BM over teh last 24 hours.  She had a colonoscopy 2 years ago and was told within normal limits. Ct abd on admission shows diverticulosis without evidence of diverticulitis. TSH within normal limits.. Continue to monitor.  5-DVT: SCD's...  Liz Pinho 05/11/2011, 3:32 PM

## 2011-05-11 NOTE — Progress Notes (Signed)
Kelly Hart,PT Acute Rehabilitation [phone] (pager)  

## 2011-05-11 NOTE — Consult Note (Signed)
Reason for Consult:SBO Consulting Surgeon: Avel Peace Referring Physician: Blake Divine   HPI: Kelly Hart is an 78 y.o. female with significant medical history including severe heart failure on home milrinone. She has a history of prior abdominal surgery including lap chole about 8 years ago and Aorto-bi-fem 2 years ago. She was admitted about a week ago with SBO, treated conservatively and actually made progress. Her NG was removed and she was put on diet. However, when her duet was advanced to solids, she began having recurrent N/V and distention. She was having having BMs and flatus up until last evening, which has now stopped. She feels her abdomen is bloated some again but hasn't vomited this am. She had some repeat abd x-rays which now look worse with regards to bowel dilatation. Surgery consult requested.   Past Medical History:  Past Medical History  Diagnosis Date  . Hypertension   . Renal insufficiency     baseline around cr 1.3  . Atrial fibrillation   . COPD (chronic obstructive pulmonary disease)   . CHF (congestive heart failure)     diastolic  . PEA (Pulseless electrical activity) Oct 2012  . PAD (peripheral artery disease)   . Hyperlipidemia   . Hypothyroidism   . History of cervical cancer   . Chronic diarrhea     secondary to treatment of her cervical cancer  . Hydronephrosis, left   . Pulmonary hypertension   . GERD (gastroesophageal reflux disease)   . Myocardial infarction     Surgical History:  Past Surgical History  Procedure Date  . Aortobifem bypass grafting   . Open thrombectomy Oct 2012  . Pr vein bypass graft,aorto-fem-pop 02/04/2009    Family History:  Family History  Problem Relation Age of Onset  . Heart failure Brother   . Heart disease Other     Social History:  reports that she quit smoking about 3 months ago. Her smoking use included Cigarettes. She has a 20 pack-year smoking history. She does not have any smokeless tobacco history on  file. She reports that she does not drink alcohol or use illicit drugs.  Allergies:  Allergies  Allergen Reactions  . Iodine Swelling  . Lisinopril Swelling    throat  . Omnipaque (Iohexol) Other (See Comments)    Unknown  . Penicillins Swelling    Medications:  Prior to Admission:  Prescriptions prior to admission  Medication Sig Dispense Refill  . amiodarone (PACERONE) 200 MG tablet Take 200 mg by mouth daily.      Marland Kitchen aspirin EC 81 MG tablet Take 81 mg by mouth daily.        Marland Kitchen atorvastatin (LIPITOR) 40 MG tablet Take 40 mg by mouth daily.      . digoxin (LANOXIN) 0.125 MG tablet Take 0.0625 mcg by mouth daily.       Marland Kitchen levothyroxine (SYNTHROID, LEVOTHROID) 100 MCG tablet Take 100 mcg by mouth daily.      . magnesium oxide (MAG-OX) 400 MG tablet Take 400 mg by mouth daily.      . milrinone (PRIMACOR) 200-5 MCG/ML-% infusion Inject 0.125 mcg/kg/min into the vein continuous.      . Multiple Vitamins-Minerals (MULTIVITAMINS THER. W/MINERALS) TABS Take 1 tablet by mouth daily.        . pantoprazole (PROTONIX) 40 MG tablet Take 40 mg by mouth daily.      . Tadalafil, PAH, 20 MG TABS Take 20 mg by mouth daily.      . traMADol (ULTRAM) 50  MG tablet Take 50-100 mg by mouth every 6 (six) hours as needed. Maximum dose= 8 tablets per day, for pain       . zolpidem (AMBIEN) 5 MG tablet Take 5 mg by mouth at bedtime as needed. For insomnia        ROS: See HPI for pertinent findings, otherwise complete 10 system review negative.  Physical Exam: Blood pressure 118/65, pulse 106, temperature 97.8 F (36.6 C), temperature source Oral, resp. rate 18, height 5\' 4"  (1.626 m), weight 43.5 kg (95 lb 14.4 oz), SpO2 95.00%.  General Appearance:  Alert, cooperative, thin AA female, NAD  Head:  Normocephalic, without obvious abnormality, atraumatic  ENT: Unremarkable  Neck: Supple, symmetrical, trachea midline, no adenopathy, thyroid: not enlarged, symmetric, no tenderness/mass/nodules  Lungs:   Clear  to auscultation bilaterally, no w/r/r, respirations unlabored without use of accessory muscles.  Chest Wall:  No tenderness or deformity  Heart:  Regular rate and rhythm, S1, S2 normal, no murmur, rub or gallop. Carotids 2+ without bruit.  Abdomen:   Soft, mildly distended. Mildly tender without peritoneal signs.  Rectal:  Deferred.  Extremities: Extremities normal, atraumatic, no cyanosis or edema  Skin: Skin color, texture, turgor normal, no rashes or lesions  Neurologic: Normal affect, no gross deficits.     Labs: CBC  Basename 05/10/11 0619  WBC 5.6  HGB 9.5*  HCT 29.7*  PLT 197   MET  Basename 05/10/11 0619 05/09/11 0425  NA 140 139  K 4.4 3.4*  CL 110 105  CO2 21 27  GLUCOSE 92 96  BUN 5* 5*  CREATININE 1.06 1.07  CALCIUM 8.4 8.3*   No results found for this basename: PROT,ALBUMIN,AST,ALT,ALKPHOS,BILITOT,BILIDIR,IBILI,LIPASE in the last 72 hours PT/INR No results found for this basename: LABPROT:2,INR:2 in the last 72 hours ABG No results found for this basename: PHART:2,PCO2:2,PO2:2,HCO3:2 in the last 72 hours    Dg Abd 2 Views  05/10/2011  *RADIOLOGY REPORT*  Clinical Data: Resolution of small bowel obstruction.  ABDOMEN - 2 VIEW  Comparison: 05/08/2011.  Findings: Compared yesterday's exam, there is more dilation of small bowel loops suggesting progression of small bowel obstruction which may be intermittent.  The nasogastric tube has been removed. Cholecystectomy clips are present in the right upper quadrant. Atherosclerosis is noted.  Stool and bowel gas are present in the rectosigmoid. Compared yesterday's exam, there has been distal progression of colonic contrast and gas and fluid are present within the proximal colon.  IMPRESSION:  1.  Interval removal of nasogastric tube. 2.  Progressive distention of small bowel loops suggesting persistent partial small bowel obstruction or intermittent obstruction.  Original Report Authenticated By: Andreas Newport, [name]     Assessment: 1) SBO  2) Severe right heart failure on home milrinone and Adcirca  3) COPD.  4) PAD s/p aortobifem graft  5) CRI, stage III-IV  6) DM2, with symptomatic hypoglycemia   Plan: Will reinsert NG to LIWS for bowel rest. May need SBFT if no improvement She is almost 2 weeks into this process with nominal po intake, will order pharmacy to start TNA as she likely has some degree of PCM D/W cards team that conservative approach is preferred in this high risk pt. Will follow along, repeat films in am. Long d/w pt and family regarding treatment approach and that surgery may ultimately be necessary.      Marianna Fuss PA-C 05/11/2011, 9:38 AM

## 2011-05-11 NOTE — Progress Notes (Signed)
Advanced Heart Failure Rounding Note   Subjective:   Kelly Hart is a 78 y/o woman with COPD and severe right heart failure on home milrinone. Presents to ER with 1-2 weeks of intermittent nausea, abdominal  pain and poor po intake. No HF symptoms. On exams no evidence of fluid overload. Labs within normal limits except for UTI. CT abdomen with prominent SBO.   PT continues to follow. Desaturated yesterday with ambulation (79%). Decreased 4 pounds. Did not tolerate regular diet. Nausea/vomiting last night.  KUB suggests progressive SBO.   Feels terrible. LUQ pain. Persistent nausea. Denies SOB/PND/Orthopnea     Objective:    Vital Signs:   Temp:  [97.8 F (36.6 C)-98.7 F (37.1 C)] 97.8 F (36.6 C) (01/25 0422) Pulse Rate:  [86-106] 106  (01/25 0422) Resp:  [18] 18  (01/25 0422) BP: (118-161)/(65-92) 118/65 mmHg (01/25 0422) SpO2:  [95 %-98 %] 95 % (01/25 0422) Weight:  [43.5 kg (95 lb 14.4 oz)] 43.5 kg (95 lb 14.4 oz) (01/25 0422) Last BM Date: 05/10/11  24-hour weight change: Weight change: -1.814 kg (-4 lb)  Intake/Output:   Intake/Output Summary (Last 24 hours) at 05/11/11 0735 Last data filed at 05/10/11 2302  Gross per 24 hour  Intake  783.2 ml  Output   1653 ml  Net -869.8 ml     Physical Exam: General:  Well appearing. No resp difficulty HEENT: normal Neck: supple. JVP 5-6 . Carotids 2+ bilat; no bruits. No lymphadenopathy or thryomegaly appreciated. Cor: PMI nondisplaced. Regular rate & rhythm. No rubs, gallops or murmurs. Lungs: clear Abdomen: soft, LUQ tender, nondistended. No hepatosplenomegaly. No bruits or masses. Bowel sounds present Extremities: no cyanosis, clubbing, rash, edema Neuro: alert & orientedx3, cranial nerves grossly intact. moves all 4 extremities w/o difficulty. Affect pleasant  Telemetry: Sinus Rhythm   Labs: Basic Metabolic Panel:  Lab 05/10/11 2952 05/09/11 0425 05/08/11 0500 05/06/11 0928 05/06/11 0500 05/05/11 0630  NA 140 139 141  -- 140 138  K 4.4 3.4* 2.8* -- 4.1 3.3*  CL 110 105 104 -- 103 98  CO2 21 27 28  -- 24 31  GLUCOSE 92 96 92 -- 42* 71  BUN 5* 5* 5* -- 17 15  CREATININE 1.06 1.07 0.95 -- 1.18* 1.27*  CALCIUM 8.4 8.3* 8.4 -- -- --  MG -- -- -- 1.9 -- --  PHOS -- -- -- -- -- --    Liver Function Tests:  Lab 05/04/11 1719  AST 26  ALT 12  ALKPHOS 88  BILITOT 0.5  PROT 7.3  ALBUMIN 3.7    Lab 05/04/11 1719  LIPASE 24  AMYLASE --   No results found for this basename: AMMONIA:3 in the last 168 hours  CBC:  Lab 05/10/11 0619 05/08/11 0500 05/05/11 0630 05/04/11 1719  WBC 5.6 6.1 6.6 6.5  NEUTROABS -- -- -- 4.9  HGB 9.5* 10.4* 10.2* 11.8*  HCT 29.7* 32.0* 32.6* 37.0  MCV 87.1 86.5 88.6 88.5  PLT 197 212 197 226    Cardiac Enzymes: No results found for this basename: CKTOTAL:5,CKMB:5,CKMBINDEX:5,TROPONINI:5 in the last 168 hours  BNP: No components found with this basename: POCBNP:5  CBG:  Lab 05/11/11 0600 05/10/11 2055 05/10/11 1636 05/10/11 1150 05/10/11 0605  GLUCAP 103* 106* 111* 85 93    Coagulation Studies: No results found for this basename: LABPROT:5,INR:5 in the last 72 hours  Other results: 05/10/11 ECHO-.EF 505-55%  Ventricular septum: Septal motion showed "bounce". The contour showed diastolic flattening and systolic flattening. - Right  ventricle: The cavity size was moderately dilated. Systolic function was mildly reduced. - Right atrium: The atrium was mildly dilated. - Tricuspid valve: Severe regurgitation. - Pulmonary arteries: Systolic pressure was moderately increased.    Imaging: Dg Abd 2 Views  05/10/2011  *RADIOLOGY REPORT*  Clinical Data: Resolution of small bowel obstruction.  ABDOMEN - 2 VIEW  Comparison: 05/08/2011.  Findings: Compared yesterday's exam, there is more dilation of small bowel loops suggesting progression of small bowel obstruction which may be intermittent.  The nasogastric tube has been removed. Cholecystectomy clips are present in the  right upper quadrant. Atherosclerosis is noted.  Stool and bowel gas are present in the rectosigmoid. Compared yesterday's exam, there has been distal progression of colonic contrast and gas and fluid are present within the proximal colon.  IMPRESSION:  1.  Interval removal of nasogastric tube. 2.  Progressive distention of small bowel loops suggesting persistent partial small bowel obstruction or intermittent obstruction.  Original Report Authenticated By: Andreas Newport, [name]      Medications:     Scheduled Medications:    . amiodarone  200 mg Oral Daily  . antiseptic oral rinse  15 mL Mouth Rinse q12n4p  . aspirin EC  81 mg Oral Daily  . digoxin  62.5 mcg Oral Daily  . enoxaparin (LOVENOX) injection  30 mg Subcutaneous Q24H  . furosemide  40 mg Oral Daily  . insulin aspart  0-5 Units Subcutaneous QHS  . insulin aspart  0-9 Units Subcutaneous TID WC  . levothyroxine  100 mcg Oral Q0700  . multivitamins ther. w/minerals  1 tablet Oral Daily  . pantoprazole  40 mg Oral Daily  . simvastatin  20 mg Oral q1800  . Tadalafil (PAH)  20 mg Oral Daily     Infusions:    . milrinone 0.25 mcg/kg/min (05/11/11 0705)  . DISCONTD: dextrose 5 % and 0.9% NaCl 50 mL/hr at 05/10/11 1610     PRN Medications:  acetaminophen, acetaminophen, alum & mag hydroxide-simeth, HYDROmorphone, oxyCODONE, phenol, promethazine, sodium chloride, sodium chloride, traMADol, zolpidem, DISCONTD: ondansetron (ZOFRAN) IV, DISCONTD: ondansetron   Assessment:  1) SBO  2) Severe right heart failure on home milrinone and Adcirca  3) COPD.  4) PAD s/p aortobifem graft  5) CRI, stage III-IV  6) DM2, with symptomatic hypoglycemia       Plan/Discussion:     Consider general surgery consult due to progressive SBO. Complaining of LUQ pain. Nausea/Vomiting since yesterday.  NPO. IV Fluids D5  NS at 50cc/hr. Will contact primary team.    Length of Stay: 7   CLEGG,AMY NP-C   05/11/2011, 7:35 AM  Patient  seen and examined with [name], NP. We discussed all aspects of the encounter. I agree with the assessment and plan as stated above. KUB is worse. D/s Dr. Abbey Chatters in [facility]. Have recommended replacing NGT and consider parenteral nutrition to give her SBO chance to resolve without surgery (which is obviously high risk). Stablef rom cardiac standpoint. I will see again on Monday. Page me over weekend with questions [phone].  Azlee Monforte,MD 9:34 AM

## 2011-05-11 NOTE — Progress Notes (Signed)
Patient is a long standing patient of Dr. Prescott Gum and CSW stopped by patient's room to check in with the patient. The patient was appreciative of the visit and support.

## 2011-05-11 NOTE — Progress Notes (Addendum)
INITIAL ADULT NUTRITION ASSESSMENT Date: 05/11/2011   Time: 1:57 PM  Reason for Assessment: Consult- TPN recommendations  ASSESSMENT: Female 78 y.o.  Dx: partial small bowel obstruction  Hx:  Past Medical History  Diagnosis Date  . Hypertension   . Renal insufficiency     baseline around cr 1.3  . Atrial fibrillation   . COPD (chronic obstructive pulmonary disease)   . CHF (congestive heart failure)     diastolic  . PEA (Pulseless electrical activity) Oct 2012  . PAD (peripheral artery disease)   . Hyperlipidemia   . Hypothyroidism   . History of cervical cancer   . Chronic diarrhea     secondary to treatment of her cervical cancer  . Hydronephrosis, left   . Pulmonary hypertension   . GERD (gastroesophageal reflux disease)   . Myocardial infarction    Related Meds:     . amiodarone  200 mg Oral Daily  . antiseptic oral rinse  15 mL Mouth Rinse q12n4p  . aspirin EC  81 mg Oral Daily  . digoxin  0.0625 mg Oral Daily  . enoxaparin (LOVENOX) injection  30 mg Subcutaneous Q24H  . furosemide  40 mg Oral Daily  . insulin aspart  0-9 Units Subcutaneous Q6H  . levothyroxine  100 mcg Oral Q0700  . magnesium sulfate 1 - 4 g bolus IVPB  4 g Intravenous Once  . pantoprazole (PROTONIX) IV  40 mg Intravenous Q24H  . simvastatin  20 mg Oral q1800  . Tadalafil (PAH)  20 mg Oral Daily  . DISCONTD: digoxin  62.5 mcg Oral Daily  . DISCONTD: furosemide  40 mg Oral Daily  . DISCONTD: insulin aspart  0-5 Units Subcutaneous QHS  . DISCONTD: insulin aspart  0-9 Units Subcutaneous TID WC  . DISCONTD: multivitamins ther. w/minerals  1 tablet Oral Daily  . DISCONTD: pantoprazole  40 mg Oral Daily    Ht: 5\' 4"  (162.6 cm)  Wt: 95 lb 14.4 oz (43.5 kg) (C SCALE)  Ideal Wt: 54.5 kg % Ideal Wt: 79.8%  Usual Wt: 97 lb (per pt family) % Usual Wt: 97.9%  Body mass index is 16.46 kg/(m^2). underweight  Food/Nutrition Related Hx: Pt family reported pt had a 5 lb weight loss in 1 week  PTA.   Labs:  CMP     Component Value Date/Time   NA 143 05/11/2011 0925   K 3.7 05/11/2011 0925   CL 105 05/11/2011 0925   CO2 29 05/11/2011 0925   GLUCOSE 99 05/11/2011 0925   BUN 8 05/11/2011 0925   CREATININE 1.34* 05/11/2011 0925   CALCIUM 8.9 05/11/2011 0925   PROT 7.3 05/04/2011 1719   ALBUMIN 3.7 05/04/2011 1719   AST 26 05/04/2011 1719   ALT 12 05/04/2011 1719   ALKPHOS 88 05/04/2011 1719   BILITOT 0.5 05/04/2011 1719   GFRNONAA 37* 05/11/2011 0925   GFRAA 43* 05/11/2011 0925   CBG (last 3)   Basename 05/11/11 1126 05/11/11 0600 05/10/11 2055  GLUCAP 96 103* 106*    Intake/Output Summary (Last 24 hours) at 05/11/11 1403 Last data filed at 05/11/11 1000  Gross per 24 hour  Intake  803.9 ml  Output    951 ml  Net -147.1 ml    Diet Order: NPO  IVF:    dextrose 5 % and 0.9% NaCl   dextrose 5 % and 0.9% NaCl   TPN (CLINIMIX) +/- additives   And   fat emulsion   milrinone Last Rate: 0.25 mcg/kg/min (  05/11/11 0705)    Estimated Nutritional Needs:   Kcal: 1200-1300 Protein: 55-65 grams Fluid: 1.5 L  When advanced to solids, pt began having N/V and abdominal distention.   Pt is receiving TPN with Clinimix 5/15 at 30 ml/hr and 20% fat emulsion at 5 ml/hr. Lipids to be provided MWF due to national lipid backorder. To start tonight. This provides 614 kcals and 36 grams protein (based on weekly average). This will meet 51% of the minimum estimated calorie needs, 60% of the minimum estimated protein needs.  Pt is at some risk for refeeding syndrome given malnutrition presence and poor PO intake >5 days.  Pt meets criteria for severe malnutrition in the context of acute illness.   NUTRITION DIAGNOSIS: -Malnutrition (NI-5.2).  Status: Ongoing  RELATED TO: alteration in GI tract function  AS EVIDENCE BY: 5% weight loss in one week PTA, <50% estimated energy requirement for >5 days  MONITORING/EVALUATION(Goals): Goals: TPN to meet >90% of protein needs, maximize energy  provision during national lipid backorder. Monitor: TPN formulation, weight trends, labs, I/O's  EDUCATION NEEDS: -No education needs identified at this time  INTERVENTION:  TPN per pharmacy  Monitor Mg, Phos, K levels; replete as needed  RD to follow for nutrition care plan  Dietitian #:[phone]  DOCUMENTATION CODES Per approved criteria  -Severe malnutrition in the context of acute illness or injury -Underweight    Karenann Cai 05/11/2011, 1:57 PM  Alger Memos Pager #: [phone]

## 2011-05-11 NOTE — Progress Notes (Signed)
PT Cancellation Note  Treatment cancelled today due to patient's refusal to participate because pt does not feel good.  Kelly Hart 05/11/2011, 11:52 AM

## 2011-05-11 NOTE — Progress Notes (Signed)
PARENTERAL NUTRITION CONSULT NOTE - INITIAL  Pharmacy Consult for TPN  Indication: SBO  Allergies  Allergen Reactions  . Iodine Swelling  . Lisinopril Swelling    throat  . Omnipaque (Iohexol) Other (See Comments)    Unknown  . Penicillins Swelling    Patient Measurements: Height: 5\' 4"  (162.6 cm) Weight: 95 lb 14.4 oz (43.5 kg) (C SCALE) IBW/kg (Calculated) : 54.7    Vital Signs: Temp: 97.8 F (36.6 C) (01/25 0422) Temp src: Oral (01/25 0422) BP: 118/65 mmHg (01/25 0422) Pulse Rate: 106  (01/25 0422) Intake/Output from previous day: 01/24 0701 - 01/25 0700 In: 923.9 [P.O.:720; I.V.:203.9] Out: 1653 [Urine:1650; Stool:3] Intake/Output from this shift:    Labs:  [facility] 05/11/11 0925 05/10/11 0619  WBC 8.9 5.6  HGB 10.9* 9.5*  HCT 34.0* 29.7*  PLT 224 197  APTT -- --  INR -- --     Basename 05/11/11 0925 05/10/11 0619 05/09/11 0425  NA 143 140 139  K 3.7 4.4 3.4*  CL 105 110 105  CO2 29 21 27   GLUCOSE 99 92 96  BUN 8 5* 5*  CREATININE 1.34* 1.06 1.07  LABCREA -- -- --  CREAT24HRUR -- -- --  CALCIUM 8.9 8.4 8.3*  MG 1.4* -- --  PHOS 3.1 -- --  PROT -- -- --  ALBUMIN -- -- --  AST -- -- --  ALT -- -- --  ALKPHOS -- -- --  BILITOT -- -- --  BILIDIR -- -- --  IBILI -- -- --  PREALBUMIN -- -- --  TRIG -- -- --  CHOLHDL -- -- --  CHOL -- -- --   Estimated Creatinine Clearance: 24.5 ml/min (by C-G formula based on Cr of 1.34).    Basename 05/11/11 1126 05/11/11 0600 05/10/11 2055  GLUCAP 96 103* 106*    Medical History: Past Medical History  Diagnosis Date  . Hypertension   . Renal insufficiency     baseline around cr 1.3  . Atrial fibrillation   . COPD (chronic obstructive pulmonary disease)   . CHF (congestive heart failure)     diastolic  . PEA (Pulseless electrical activity) Oct 2012  . PAD (peripheral artery disease)   . Hyperlipidemia   . Hypothyroidism   . History of cervical cancer   . Chronic diarrhea     secondary to  treatment of her cervical cancer  . Hydronephrosis, left   . Pulmonary hypertension   . GERD (gastroesophageal reflux disease)   . Myocardial infarction     Medications:  Prescriptions prior to admission  Medication Sig Dispense Refill  . amiodarone (PACERONE) 200 MG tablet Take 200 mg by mouth daily.      Marland Kitchen aspirin EC 81 MG tablet Take 81 mg by mouth daily.        Marland Kitchen atorvastatin (LIPITOR) 40 MG tablet Take 40 mg by mouth daily.      . digoxin (LANOXIN) 0.125 MG tablet Take 0.0625 mcg by mouth daily.       Marland Kitchen levothyroxine (SYNTHROID, LEVOTHROID) 100 MCG tablet Take 100 mcg by mouth daily.      . magnesium oxide (MAG-OX) 400 MG tablet Take 400 mg by mouth daily.      . milrinone (PRIMACOR) 200-5 MCG/ML-% infusion Inject 0.125 mcg/kg/min into the vein continuous.      . Multiple Vitamins-Minerals (MULTIVITAMINS THER. W/MINERALS) TABS Take 1 tablet by mouth daily.        . pantoprazole (PROTONIX) 40 MG tablet Take 40  mg by mouth daily.      . Tadalafil, PAH, 20 MG TABS Take 20 mg by mouth daily.      . traMADol (ULTRAM) 50 MG tablet Take 50-100 mg by mouth every 6 (six) hours as needed. Maximum dose= 8 tablets per day, for pain       . zolpidem (AMBIEN) 5 MG tablet Take 5 mg by mouth at bedtime as needed. For insomnia        Insulin Requirements in the past 12 hours:  None  Current Nutrition:  NPO  Nutritional Goals (pending RD assessment):  ~1300 kCal, ~65 grams of protein per day  Assessment: 78 y.o. female with SBO has been treated conservatively for about a week. New Xrays show worsening of abd dilatation. Made NPO today and to begin TPN. Pt was eating a few days ago so do not suspect high risk for refeeding syndrome.  Cardiovascular: on home milrinone, dig,lasix,statin, tadalafil  Endocrinology: h/o DM - no meds PTA; pt with low-nl CBGs past few days even while eating. Will not start insulin in initial TPN bag but will watch closely.  Lytes: Mg 1.4 - will bolus today. K 3.7.  Would like K >4 and Mg > 2 with afib.  Plan:  1. Begin TPN (Clinimix E) at 30 ml/hr and will advance to goal as tolerated 2. Lipids at 55ml/hr, TE, MVI on Mon/Wed/Fri due to national shortage 3. Change SSI to q6h 4. F/u a.m. Labs and RD goal recommendations  Christoper Fabian, PharmD, BCPS Pager: [phone] 05/11/2011,12:11 PM

## 2011-05-11 NOTE — Consult Note (Signed)
Patient seen and examined.  Has a persistent partial sbo and is very high risk for an operation.  She was tolerating liquids but did not tolerate solids.  Will reinsert ng, start TPN and attempt nonoperative management again.

## 2011-05-12 ENCOUNTER — Inpatient Hospital Stay (HOSPITAL_COMMUNITY): Payer: PRIVATE HEALTH INSURANCE

## 2011-05-12 LAB — COMPREHENSIVE METABOLIC PANEL
ALT: 16 U/L (ref 0–35)
AST: 18 U/L (ref 0–37)
Albumin: 2.8 g/dL — ABNORMAL LOW (ref 3.5–5.2)
CO2: 27 mEq/L (ref 19–32)
Calcium: 8.1 mg/dL — ABNORMAL LOW (ref 8.4–10.5)
Creatinine, Ser: 1.31 mg/dL — ABNORMAL HIGH (ref 0.50–1.10)
GFR calc non Af Amer: 38 mL/min — ABNORMAL LOW (ref >90)
Sodium: 139 mEq/L (ref 135–145)
Total Protein: 5.8 g/dL — ABNORMAL LOW (ref 6.0–8.3)

## 2011-05-12 LAB — GLUCOSE, CAPILLARY
Glucose-Capillary: 132 mg/dL — ABNORMAL HIGH (ref 70–99)
Glucose-Capillary: 131 mg/dL — ABNORMAL HIGH (ref 70–99)
Glucose-Capillary: 123 mg/dL — ABNORMAL HIGH (ref 70–99)

## 2011-05-12 LAB — CBC
HCT: 30.2 % — ABNORMAL LOW (ref 36.0–46.0)
Hemoglobin: 9.6 g/dL — ABNORMAL LOW (ref 12.0–15.0)
MCHC: 31.8 g/dL (ref 30.0–36.0)
RDW: 14.4 % (ref 11.5–15.5)
WBC: 5.9 10*3/uL (ref 4.0–10.5)

## 2011-05-12 LAB — DIFFERENTIAL
Basophils Absolute: 0 10*3/uL (ref 0.0–0.1)
Basophils Relative: 0 % (ref 0–1)
Lymphocytes Relative: 16 % (ref 12–46)
Monocytes Absolute: 0.5 10*3/uL (ref 0.1–1.0)
Monocytes Relative: 8 % (ref 3–12)
Neutro Abs: 4.4 10*3/uL (ref 1.7–7.7)
Neutrophils Relative %: 74 % (ref 43–77)

## 2011-05-12 LAB — PHOSPHORUS: Phosphorus: 3.1 mg/dL (ref 2.3–4.6)

## 2011-05-12 LAB — MAGNESIUM: Magnesium: 2.2 mg/dL (ref 1.5–2.5)

## 2011-05-12 LAB — CHOLESTEROL, TOTAL: Cholesterol: 101 mg/dL (ref 0–200)

## 2011-05-12 MED ORDER — CLINIMIX/DEXTROSE (5/15) 5 % IV SOLN: INTRAVENOUS | Status: DC

## 2011-05-12 MED ORDER — AMIODARONE HCL IN DEXTROSE 360-4.14 MG/200ML-% IV SOLN
30.0000 mg/h | INTRAVENOUS | Status: DC
  Administered 2011-05-12 – 2011-05-17 (×10): 30 mg/h via INTRAVENOUS
  Filled 2011-05-12 (×25): qty 200

## 2011-05-12 MED ORDER — POTASSIUM CHLORIDE 10 MEQ/50ML IV SOLN
10.0000 meq | INTRAVENOUS | Status: AC
  Administered 2011-05-12 (×3): 10 meq via INTRAVENOUS
  Filled 2011-05-12 (×3): qty 50

## 2011-05-12 MED ORDER — CLINIMIX E/DEXTROSE (5/15) 5 % IV SOLN
INTRAVENOUS | Status: AC
  Administered 2011-05-12: 18:00:00 via INTRAVENOUS
  Filled 2011-05-12: qty 2000

## 2011-05-12 NOTE — Progress Notes (Signed)
Patient interviewed and examined. Agree with Mr. Kelly Hart' note.  Having some loose stools  Abdomen a little softer, still slightly distended. Minimal tenderness.  NG repositioned, hopefully this will help. Xrays a little better.  No clinical evidence for compromised bowel.  Hope we can avoid surgery in this patient with tenuous cardiac status.   Kelly Hart. Derrell Lolling, [name], [facility] General and Minimally invasive Surgery Breast and Colorectal Surgery Office:   [phone] Pager:   [phone]

## 2011-05-12 NOTE — Progress Notes (Signed)
Advanced Heart Failure Rounding Note   Subjective:   Kelly Hart is a 78 y/o woman with COPD and severe right heart failure on home milrinone. Presents to ER with 1-2 weeks of intermittent nausea, abdominal  pain and poor po intake. No HF symptoms. On exams no evidence of fluid overload. Labs within normal limits except for UTI. CT abdomen with prominent SBO.   Seen by [facility] yesterday after she had more ab pain and KUB with worsening SBO. NG tube replaced and started on TPN. Feels much better. Ab pain improved. Continues with small BMs. Denies CP/SOB.   No weight on chart today.   Objective:    Vital Signs:   Temp:  [98.1 F (36.7 C)-99.1 F (37.3 C)] 99.1 F (37.3 C) (01/26 2054) Pulse Rate:  [90-106] 90  (01/26 2054) Resp:  [18-20] 18  (01/26 2054) BP: (119-148)/(41-74) 137/41 mmHg (01/26 2054) SpO2:  [90 %-100 %] 100 % (01/26 2054) Last BM Date: 05/10/11  24-hour weight change: Weight change:   Intake/Output:   Intake/Output Summary (Last 24 hours) at 05/12/11 2149 Last data filed at 05/12/11 1831  Gross per 24 hour  Intake 1736.18 ml  Output    130 ml  Net 1606.18 ml     Physical Exam: General:  Well appearing. No resp difficulty HEENT: normal Neck: supple. JVP 5-6 . Carotids 2+ bilat; no bruits. No lymphadenopathy or thryomegaly appreciated. Cor: PMI nondisplaced. Regular rate & rhythm. No rubs, gallops or murmurs. Lungs: clear Abdomen: soft, LUQ tender, nondistended. No hepatosplenomegaly. No bruits or masses. Bowel sounds present Extremities: no cyanosis, clubbing, rash, edema Neuro: alert & orientedx3, cranial nerves grossly intact. moves all 4 extremities w/o difficulty. Affect pleasant  Telemetry: Sinus Rhythm   Labs: Basic Metabolic Panel:  Lab 05/12/11 7829 05/11/11 0925 05/10/11 0619 05/09/11 0425 05/08/11 0500 05/06/11 0928  NA 139 143 140 139 141 --  K 3.7 3.7 4.4 3.4* 2.8* --  CL 104 105 110 105 104 --  CO2 27 29 21 27 28  --  GLUCOSE 114* 99 92 96 92  --  BUN 13 8 5* 5* 5* --  CREATININE 1.31* 1.34* 1.06 1.07 0.95 --  CALCIUM 8.1* 8.9 8.4 -- -- --  MG 2.2 1.4* -- -- -- 1.9  PHOS 3.1 3.1 -- -- -- --    Liver Function Tests:  Lab 05/12/11 0500  AST 18  ALT 16  ALKPHOS 66  BILITOT 0.3  PROT 5.8*  ALBUMIN 2.8*   No results found for this basename: LIPASE:5,AMYLASE:5 in the last 168 hours No results found for this basename: AMMONIA:3 in the last 168 hours  CBC:  Lab 05/12/11 0500 05/11/11 0925 05/10/11 0619 05/08/11 0500  WBC 5.9 8.9 5.6 6.1  NEUTROABS 4.4 -- -- --  HGB 9.6* 10.9* 9.5* 10.4*  HCT 30.2* 34.0* 29.7* 32.0*  MCV 86.8 86.7 87.1 86.5  PLT 226 224 197 212    Cardiac Enzymes: No results found for this basename: CKTOTAL:5,CKMB:5,CKMBINDEX:5,TROPONINI:5 in the last 168 hours  BNP: No components found with this basename: POCBNP:5  CBG:  Lab 05/12/11 1608 05/12/11 1300 05/12/11 0624 05/11/11 2357 05/11/11 2122  GLUCAP 123* 131* 132* 119* 136*    Coagulation Studies: No results found for this basename: LABPROT:5,INR:5 in the last 72 hours  Other results: 05/10/11 ECHO-.EF 505-55%  Ventricular septum: Septal motion showed "bounce". The contour showed diastolic flattening and systolic flattening. - Right ventricle: The cavity size was moderately dilated. Systolic function was mildly reduced. - Right atrium:  The atrium was mildly dilated. - Tricuspid valve: Severe regurgitation. - Pulmonary arteries: Systolic pressure was moderately increased.    Imaging: Dg Abd 2 Views  05/12/2011  *RADIOLOGY REPORT*  Clinical Data: Abdominal pain.  Follow-up small bowel obstruction.  ABDOMEN - 2 VIEW  Comparison: 05/10/2011 and 05/08/2011.  Findings: Nasogastric tube tip is in the distal esophagus and should be advanced.  Small bowel distension appears slightly improved.  There is a residual dilated loop in the left abdomen measuring 4.2 cm in diameter.  Decubitus view demonstrates scattered air-fluid levels and no  pneumoperitoneum. Cholecystectomy clips and diverticula at the splenic flexure are noted.  IMPRESSION:  1.  Slight improvement in small bowel obstruction pattern.  No evidence of pneumoperitoneum. 2.  The tip of the nasogastric tube is in the distal esophagus and should be advanced.  Original Report Authenticated By: Gerrianne Scale, [name]     Medications:     Scheduled Medications:    . antiseptic oral rinse  15 mL Mouth Rinse q12n4p  . aspirin EC  81 mg Oral Daily  . digoxin  0.0625 mg Intravenous Daily  . enoxaparin (LOVENOX) injection  30 mg Subcutaneous Q24H  . furosemide  40 mg Oral Daily  . insulin aspart  0-9 Units Subcutaneous Q6H  . levothyroxine  50 mcg Intravenous Daily  . pantoprazole (PROTONIX) IV  40 mg Intravenous Q24H  . potassium chloride  10 mEq Intravenous Q1 Hr x 3  . simvastatin  20 mg Oral q1800  . Tadalafil (PAH)  20 mg Oral Daily  . DISCONTD: amiodarone  200 mg Oral Daily    Infusions:    . amiodarone (NEXTERONE PREMIX) 360 mg/200 mL dextrose 30 mg/hr (05/12/11 1230)  . dextrose 5 % and 0.9% NaCl 10 mL/hr at 05/11/11 2035  . TPN (CLINIMIX) +/- additives 30 mL/hr at 05/11/11 1749   And  . fat emulsion 120 mL (05/11/11 1750)  . milrinone 0.25 mcg/kg/min (05/12/11 1831)  . TPN (CLINIMIX) +/- additives 50 mL/hr at 05/12/11 1734  . DISCONTD: TPN (CLINIMIX) +/- additives      PRN Medications: acetaminophen, acetaminophen, HYDROmorphone, phenol, promethazine, sodium chloride, sodium chloride   Assessment:  1) SBO  2) Severe right heart failure on home milrinone and Adcirca  3) COPD.  4) PAD s/p aortobifem graft  5) CRI, stage III-IV  6) DM2, with symptomatic hypoglycemia     Plan/Discussion:    Comfortable. Appreciate GSU's help. Hopefully SBO will resolve with conservative management.   Right heart failure stable at this point. Cr slightly up but stable - may e due in part to TPN. Will watch closely.    Length of Stay: 8  Jasani Lengel,MD 9:52 PM

## 2011-05-12 NOTE — Progress Notes (Signed)
Subjective: Not much from NG (30ml) recorded yesterday. Film shows the ND is to high so I advanced it.  Film much improved.  + couple episodes of diarrhea yesterday since we saw her.  Objective: Vital signs in last 24 hours: Temp:  [97.8 F (36.6 C)-98.7 F (37.1 C)] 98.7 F (37.1 C) 15-May-2022 0415) Pulse Rate:  [100-116] 100  05/15/2022 0415) Resp:  [18-20] 18  2022/05/15 0415) BP: (112-136)/(64-75) 136/74 mmHg 05/15/22 0415) SpO2:  [97 %-100 %] 98 % 05-15-2022 0415) Last BM Date: 05/10/11  Intake/Output from previous day: 01/25 0701 - 05-15-2022 0700 In: 1045.5 [P.O.:480; I.V.:104.2; TPN:461.3] Out: 430 [Urine:400; Emesis/NG output:30] Intake/Output this shift:    PE: Alert up in chair, actually feels better.  Doesn't like NG. Abd: soft, not distended, Few bowel sounds, +bruit, not really tender.    Lab Results:   Basename 2011-05-16 0500 05/11/11 0925  WBC 5.9 8.9  HGB 9.6* 10.9*  HCT 30.2* 34.0*  PLT 226 224    BMET  Basename 16-May-2011 0500 05/11/11 0925  NA 139 143  K 3.7 3.7  CL 104 105  CO2 27 29  GLUCOSE 114* 99  BUN 13 8  CREATININE 1.31* 1.34*  CALCIUM 8.1* 8.9   PT/INR No results found for this basename: LABPROT:2,INR:2 in the last 72 hours   Studies/Results: Dg Abd 2 Views  05/16/2011  *RADIOLOGY REPORT*  Clinical Data: Abdominal pain.  Follow-up small bowel obstruction.  ABDOMEN - 2 VIEW  Comparison: 05/10/2011 and 05/08/2011.  Findings: Nasogastric tube tip is in the distal esophagus and should be advanced.  Small bowel distension appears slightly improved.  There is a residual dilated loop in the left abdomen measuring 4.2 cm in diameter.  Decubitus view demonstrates scattered air-fluid levels and no pneumoperitoneum. Cholecystectomy clips and diverticula at the splenic flexure are noted.  IMPRESSION:  1.  Slight improvement in small bowel obstruction pattern.  No evidence of pneumoperitoneum. 2.  The tip of the nasogastric tube is in the distal esophagus and should be  advanced.  Original Report Authenticated By: Gerrianne Scale, [name]   Dg Abd 2 Views  05/10/2011  *RADIOLOGY REPORT*  Clinical Data: Resolution of small bowel obstruction.  ABDOMEN - 2 VIEW  Comparison: 05/08/2011.  Findings: Compared yesterday's exam, there is more dilation of small bowel loops suggesting progression of small bowel obstruction which may be intermittent.  The nasogastric tube has been removed. Cholecystectomy clips are present in the right upper quadrant. Atherosclerosis is noted.  Stool and bowel gas are present in the rectosigmoid. Compared yesterday's exam, there has been distal progression of colonic contrast and gas and fluid are present within the proximal colon.  IMPRESSION:  1.  Interval removal of nasogastric tube. 2.  Progressive distention of small bowel loops suggesting persistent partial small bowel obstruction or intermittent obstruction.  Original Report Authenticated By: Andreas Newport, [name]    Anti-infectives: Anti-infectives    None     Current Facility-Administered Medications  Medication Dose Route Frequency Provider Last Rate Last Dose  . acetaminophen (TYLENOL) tablet 650 mg  650 mg Oral Q6H PRN Ron Parker, MD       Or  . acetaminophen (TYLENOL) suppository 650 mg  650 mg Rectal Q6H PRN Ron Parker, MD      . amiodarone (NEXTERONE PREMIX) 360 mg/200 mL dextrose IV infusion  30 mg/hr Intravenous Continuous Ok Anis, NP      . antiseptic oral rinse (BIOTENE) solution 15 mL  15 mL  Mouth Rinse q12n4p Vassie Loll, MD   15 mL at 05/10/11 2028  . aspirin EC tablet 81 mg  81 mg Oral Daily Ron Parker, MD   81 mg at 05/10/11 1035  . dextrose 5 %-0.9 % sodium chloride infusion   Intravenous Continuous Kathlen Mody, MD      . dextrose 5 %-0.9 % sodium chloride infusion   Intravenous Continuous Hilario Quarry Amend, PHARMD 10 mL/hr at 05/11/11 2035    . digoxin (LANOXIN) injection 0.0625 mg  0.0625 mg Intravenous Daily Kathlen Mody,  MD   0.625 mg at 05/11/11 2001  . enoxaparin (LOVENOX) injection 30 mg  30 mg Subcutaneous Q24H Marylouise Stacks, PHARMD   30 mg at 05/11/11 2000  . tpn solution (CLINIMIX 5/15) 2,000 mL with multivitamins adult 10 mL, trace elements Cr-Cu-Mn-Se-Zn 1 mL infusion   Intravenous Continuous TPN Hilario Quarry Amend, PHARMD 30 mL/hr at 05/11/11 1749     And  . fat emulsion 20 % infusion 120 mL  120 mL Intravenous Continuous TPN Hilario Quarry Amend, PHARMD 5 mL/hr at 05/11/11 1750 120 mL at 05/11/11 1750  . furosemide (LASIX) 8 MG/ML solution 40 mg  40 mg Oral Daily Kathlen Mody, MD   40 mg at 05/11/11 1958  . HYDROmorphone (DILAUDID) injection 0.5-1 mg  0.5-1 mg Intravenous Q3H PRN Ron Parker, MD   1 mg at 05/10/11 2338  . insulin aspart (novoLOG) injection 0-9 Units  0-9 Units Subcutaneous Q6H Hilario Quarry Amend, PHARMD      . levothyroxine (SYNTHROID, LEVOTHROID) injection 50 mcg  50 mcg Intravenous Daily Kathlen Mody, MD   50 mcg at 05/11/11 1959  . magnesium sulfate IVPB 4 g 100 mL  4 g Intravenous Once Hilario Quarry Amend, PHARMD   4 g at 05/11/11 1436  . milrinone [facility]) infusion 200 mcg/mL (0.2 mg/mL)  0.25 mcg/kg/min Intravenous Continuous Dione Booze, MD 3.1 mL/hr at 05/11/11 0705 0.25 mcg/kg/min at 05/11/11 0705  . pantoprazole (PROTONIX) injection 40 mg  40 mg Intravenous Q24H Kathlen Mody, MD   40 mg at 05/11/11 1446  . phenol (CHLORASEPTIC) mouth spray 1 spray  1 spray Mouth/Throat PRN Vassie Loll, MD   1 spray at 05/09/11 [phone]  . potassium chloride 10 mEq in 50 mL *CENTRAL LINE* IVPB  10 mEq Intravenous Q1 Hr x 3 Meera K Patel, PHARMD      . promethazine (PHENERGAN) injection 12.5 mg  12.5 mg Intravenous Q6H PRN Maren Reamer, NP   12.5 mg at 05/11/11 1426  . simvastatin (ZOCOR) tablet 20 mg  20 mg Oral q1800 Ron Parker, MD   20 mg at 05/10/11 2023  . sodium chloride 0.9 % injection 10-40 mL  10-40 mL Intracatheter PRN Vassie Loll, MD   10 mL at 05/06/11 2119  . sodium  chloride 0.9 % injection 10-40 mL  10-40 mL Intracatheter PRN Vassie Loll, MD   10 mL at 05/12/11 8657  . Tadalafil (PAH) TABS 20 mg  20 mg Oral Daily Ron Parker, MD   20 mg at 05/10/11 1036  . tpn solution (CLINIMIX E 5/15) 2,000 mL infusion   Intravenous Continuous TPN Christella Hartigan, PHARMD      . DISCONTD: alum & mag hydroxide-simeth (MAALOX/MYLANTA) 200-200-20 MG/5ML suspension 30 mL  30 mL Oral Q6H PRN Ron Parker, MD   30 mL at 05/10/11 2023  . DISCONTD: amiodarone (PACERONE) tablet 200 mg  200 mg Oral Daily Ron Parker, MD  200 mg at 05/10/11 1035  . DISCONTD: digoxin (LANOXIN) 0.05 MG/ML solution 0.0625 mg  0.0625 mg Oral Daily Kathlen Mody, MD      . DISCONTD: digoxin (LANOXIN) tablet 62.5 mcg  62.5 mcg Oral Daily Ron Parker, MD   62.5 mcg at 05/10/11 1035  . DISCONTD: furosemide (LASIX) tablet 40 mg  40 mg Oral Daily Amy Clegg, NP   40 mg at 05/10/11 1035  . DISCONTD: insulin aspart (novoLOG) injection 0-5 Units  0-5 Units Subcutaneous QHS Kathlen Mody, MD      . DISCONTD: insulin aspart (novoLOG) injection 0-9 Units  0-9 Units Subcutaneous TID WC Kathlen Mody, MD      . DISCONTD: levothyroxine (SYNTHROID, LEVOTHROID) tablet 100 mcg  100 mcg Oral Q0700 Ron Parker, MD   100 mcg at 05/10/11 0618  . DISCONTD: multivitamins ther. w/minerals tablet 1 tablet  1 tablet Oral Daily Ron Parker, MD   1 tablet at 05/10/11 1036  . DISCONTD: oxyCODONE (Oxy IR/ROXICODONE) immediate release tablet 5 mg  5 mg Oral Q4H PRN Ron Parker, MD      . DISCONTD: pantoprazole (PROTONIX) EC tablet 40 mg  40 mg Oral Daily Ron Parker, MD   40 mg at 05/10/11 1035  . DISCONTD: pantoprazole (PROTONIX) injection 40 mg  40 mg Intravenous Q24H Kathlen Mody, MD      . DISCONTD: tpn solution (CLINIMIX 5/15) 2,000 mL infusion   Intravenous Continuous TPN Meera Concha Se, PHARMD      . DISCONTD: traMADol (ULTRAM) tablet 50 mg  50 mg Oral Q12H PRN Ron Parker,  MD      . DISCONTD: zolpidem (AMBIEN) tablet 5 mg  5 mg Oral QHS PRN Ron Parker, MD        Assessment/Plan SBO, s/pAoBiFem,/Hx Cervical ca. CAD/CM/CHF on Milrinone at home./Afib/Hx. PEA PVOD COPD Hypothyroid Gerd Hypertension Renal Insuffiency  Plan:  Improving SBO, NG advanced, continue drainage for now.  Mobilize, and see how she does.  Repeat film in AM.   LOS: 8 days    Joyceann Kruser 05/12/2011

## 2011-05-12 NOTE — Progress Notes (Signed)
Subjective: 2 BM yesterday.   Objective: Weight change:   Intake/Output Summary (Last 24 hours) at 05/12/11 1540 Last data filed at 05/12/11 1300  Gross per 24 hour  Intake 1165.5 ml  Output    430 ml  Net  735.5 ml    Filed Vitals:   05/12/11 1400  BP: 148/72  Pulse: 106  Temp: 98.1 F (36.7 C)  Resp: 18   Physical Exam:  General: Alert, awake, oriented x3, in no acute distress.  HEENT: No bruits, no goiter. No JVD  Heart: Regular rate and rhythm, positive systolic murmurs, no rubs, no gallops.  Lungs: Clear to auscultation bilaterally.  Abdomen: Soft, generalised tenderness improved.  positive bowel sounds.  Extremities: No clubbing, cyanosis or edema with positive pedal pulses.  Neuro: Grossly intact, nonfocal.   Lab Results: @labtest @  Micro Results: Recent Results (from the past 240 hour(s))  URINE CULTURE     Status: Normal   Collection Time   05/04/11  4:19 PM      Component Value Range Status Comment   Specimen Description URINE, CLEAN CATCH   Final    Special Requests ADDED 05/04/11 2016   Final    Setup Time [id number]   Final    Colony Count 75,000 COLONIES/ML   Final    Culture     Final    Value: Multiple bacterial morphotypes present, none predominant. Suggest appropriate recollection if clinically indicated.   Report Status 05/05/2011 FINAL   Final     Studies/Results: Ct Abdomen Pelvis Wo Contrast  05/04/2011  *RADIOLOGY REPORT*  Clinical Data: Nausea, abdominal pain, poor PO intake  CT ABDOMEN AND PELVIS WITHOUT CONTRAST  Technique:  Multidetector CT imaging of the abdomen and pelvis was performed following the standard protocol without intravenous contrast.  Comparison: [facility] CT abdomen/pelvis dated 01/17/2009  Findings: Chronic interstitial markings/subpleural reticulation at the lung bases.  Unenhanced liver, spleen, pancreas, and adrenal glands are within normal limits.  Status post cholecystectomy.  No intrahepatic ductal  dilatation. Stable common bile duct.  Right kidney is unremarkable.  Moderate left hydronephrosis.  No renal calculi.  Multiple dilated loops of proximal small bowel with abrupt narrowing/transition in the right mid abdomen (series 2/image 40). Distal small bowel/colon are decompressed.  This appearance is compatible with high-grade partial or complete small bowel obstruction.  Colonic diverticulosis, without associated inflammatory changes.  Atherosclerotic calcifications of the abdominal aorta and branch vessels.  Aortobifemoral bypass.  No abdominopelvic ascites.  No suspicious abdominopelvic lymphadenopathy.  Uterus is notable for calcification of the periuterine vessels.  No adnexal masses.  Proximal left hydroureter, which it abruptly narrows at the level of the crossing left femoral bypass graft (series 2/image 48).  No bladder calculi.  Mild degenerative changes of the visualized thoracolumbar spine. Sclerosis from prior bilateral sacral insufficiency fractures (series 2/image 55).  IMPRESSION: High-grade partial or complete small bowel obstruction with transition in the right mid abdomen.  Moderate left hydronephrosis with proximal left hydroureter, likely on the basis of extrinsic compression secondary to crossing left femoral bypass graft.  Additional ancillary findings as above.  Original Report Authenticated By: Charline Bills, [name]   Dg Abd 2 Views  05/12/2011  *RADIOLOGY REPORT*  Clinical Data: Abdominal pain.  Follow-up small bowel obstruction.  ABDOMEN - 2 VIEW  Comparison: 05/10/2011 and 05/08/2011.  Findings: Nasogastric tube tip is in the distal esophagus and should be advanced.  Small bowel distension appears slightly improved.  There is a residual dilated loop in the  left abdomen measuring 4.2 cm in diameter.  Decubitus view demonstrates scattered air-fluid levels and no pneumoperitoneum. Cholecystectomy clips and diverticula at the splenic flexure are noted.  IMPRESSION:  1.  Slight  improvement in small bowel obstruction pattern.  No evidence of pneumoperitoneum. 2.  The tip of the nasogastric tube is in the distal esophagus and should be advanced.  Original Report Authenticated By: Gerrianne Scale, [name]   Dg Abd 2 Views  05/10/2011  *RADIOLOGY REPORT*  Clinical Data: Resolution of small bowel obstruction.  ABDOMEN - 2 VIEW  Comparison: 05/08/2011.  Findings: Compared yesterday's exam, there is more dilation of small bowel loops suggesting progression of small bowel obstruction which may be intermittent.  The nasogastric tube has been removed. Cholecystectomy clips are present in the right upper quadrant. Atherosclerosis is noted.  Stool and bowel gas are present in the rectosigmoid. Compared yesterday's exam, there has been distal progression of colonic contrast and gas and fluid are present within the proximal colon.  IMPRESSION:  1.  Interval removal of nasogastric tube. 2.  Progressive distention of small bowel loops suggesting persistent partial small bowel obstruction or intermittent obstruction.  Original Report Authenticated By: Andreas Newport, [name]   Dg Abd 2 Views  05/08/2011  *RADIOLOGY REPORT*  Clinical Data: Follow up small bowel obstruction pattern  ABDOMEN - 2 VIEW  Comparison: 05/07/2011  Findings: The nasogastric tube tip is in the stomach with side port below GE junction.  Barium is again noted within the left colon and rectum.  No air-fluid levels identified.  Abnormal small bowel dilatation is stable to improved in the interval.  IMPRESSION:  1.  Stable to improved partial small bowel obstruction.  Original Report Authenticated By: [name], [name]   Dg Abd 2 Views  05/07/2011  *RADIOLOGY REPORT*  Clinical Data: Small bowel obstruction.  ABDOMEN - 2 VIEW  Comparison: Radiographs dated 05/06/2011 and CT scan dated 05/04/2011  Findings: NG tube tip is in the distal stomach.  There are persistent dilated loops of proximal small bowel without appreciable change  since the prior exam of 05/06/2011.  Contrast from the prior CT scan has passed into the nondistended colon indicating that the obstruction is only partial.  No free air.  Chronic interstitial lung disease.  IMPRESSION: Persistent partial small bowel obstruction, essentially unchanged since 05/06/2011  Original Report Authenticated By: [name], [name]   Dg Abd 2 Views  05/06/2011  *RADIOLOGY REPORT*  Clinical Data: Nausea, small bowel obstruction  ABDOMEN - 2 VIEW  Comparison: CT abdomen pelvis dated 05/04/2011  Findings: Dilated loops of small bowel in the lower abdomen. Residual contrast within decompressed loops of colon.  These findings suggest high-grade partial small bowel obstruction.  Colonic diverticulosis.  No evidence of free air on the lateral decubitus view.  Cholecystectomy clips.  Degenerative changes of the visualized thoracolumbar spine.  IMPRESSION: Dilated loops of small bowel with residual contrast within decompressed loops of colon, suggesting high-grade partial small bowel obstruction.  No free air.  Original Report Authenticated By: Charline Bills, [name]   Medications: Scheduled Meds:   . antiseptic oral rinse  15 mL Mouth Rinse q12n4p  . aspirin EC  81 mg Oral Daily  . digoxin  0.0625 mg Intravenous Daily  . enoxaparin (LOVENOX) injection  30 mg Subcutaneous Q24H  . furosemide  40 mg Oral Daily  . insulin aspart  0-9 Units Subcutaneous Q6H  . levothyroxine  50 mcg Intravenous Daily  . pantoprazole (PROTONIX) IV  40 mg Intravenous Q24H  . potassium chloride  10 mEq Intravenous Q1 Hr x 3  . simvastatin  20 mg Oral q1800  . Tadalafil (PAH)  20 mg Oral Daily  . DISCONTD: amiodarone  200 mg Oral Daily  . DISCONTD: digoxin  0.0625 mg Oral Daily   Continuous Infusions:   . amiodarone (NEXTERONE PREMIX) 360 mg/200 mL dextrose    . dextrose 5 % and 0.9% NaCl    . dextrose 5 % and 0.9% NaCl 10 mL/hr at 05/11/11 2035  . TPN (CLINIMIX) +/- additives 30 mL/hr at 05/11/11  1749   And  . fat emulsion 120 mL (05/11/11 1750)  . milrinone 0.25 mcg/kg/min (05/11/11 0705)  . TPN (CLINIMIX) +/- additives    . DISCONTD: TPN (CLINIMIX) +/- additives     PRN Meds:.acetaminophen, acetaminophen, HYDROmorphone, phenol, promethazine, sodium chloride, sodium chloride  Assessment/Plan:  LOS: 8 days  1-SBO: high grade most likely due to adhesions.Improved today on x ray, with 2 BM  Last night. Improving abdominal pain. NG tube on intermittent suction. Will change all po medication to IV.  2-Pulmonary hypertension and RHF (right heart failure): continue current management. Well compensated. Cardiology on board we'll follow the recommendations.  3-Hypokalemia:replete potassium as needed. Follow BMET in a.m.  4-Left hydronephrosis: appears to be chronic 2/2 to bypass into left leg.; Patient with good urination and without any left flank pain. No further workup at this point.  5. Diarrhea: No BM over teh last 24 hours. She had a colonoscopy 2 years ago and was told within normal limits. Ct abd on admission shows diverticulosis without evidence of diverticulitis. TSH within normal limits.. Continue to monitor.  5-DVT: SCD's...     Giuliano Preece 05/12/2011, 3:40 PM

## 2011-05-12 NOTE — Progress Notes (Addendum)
PARENTERAL NUTRITION CONSULT NOTE - FOLLOW UP  Pharmacy Consult for TPN Indication: SBO  Allergies  Allergen Reactions  . Iodine Swelling  . Lisinopril Swelling    throat  . Omnipaque (Iohexol) Other (See Comments)    Unknown  . Penicillins Swelling    Patient Measurements: Height: 5\' 4"  (162.6 cm) Weight: 95 lb 14.4 oz (43.5 kg) (C SCALE) IBW/kg (Calculated) : 54.7   Vital Signs: Temp: 98.7 F (37.1 C) (01/26 0415) Temp src: Oral (01/26 0415) BP: 136/74 mmHg (01/26 0415) Pulse Rate: 100  (01/26 0415) Intake/Output from previous day: 01/25 0701 - 01/26 0700 In: 1045.5 [P.O.:480; I.V.:104.2; TPN:461.3] Out: 430 [Urine:400; Emesis/NG output:30] Intake/Output from this shift:    Labs:  [facility] 05/12/11 0500 05/11/11 0925 05/10/11 0619  WBC 5.9 8.9 5.6  HGB 9.6* 10.9* 9.5*  HCT 30.2* 34.0* 29.7*  PLT 226 224 197  APTT -- -- --  INR -- -- --     Basename 05/12/11 0500 05/11/11 0925 05/10/11 0619  NA 139 143 140  K 3.7 3.7 4.4  CL 104 105 110  CO2 27 29 21   GLUCOSE 114* 99 92  BUN 13 8 5*  CREATININE 1.31* 1.34* 1.06  LABCREA -- -- --  CREAT24HRUR -- -- --  CALCIUM 8.1* 8.9 8.4  MG 2.2 1.4* --  PHOS 3.1 3.1 --  PROT 5.8* -- --  ALBUMIN 2.8* -- --  AST 18 -- --  ALT 16 -- --  ALKPHOS 66 -- --  BILITOT 0.3 -- --  BILIDIR -- -- --  IBILI -- -- --  PREALBUMIN -- -- --  TRIG 172* -- --  CHOLHDL -- -- --  CHOL 101 -- --   Estimated Creatinine Clearance: 25.1 ml/min (by C-G formula based on Cr of 1.31).    Basename 05/12/11 0624 05/11/11 2357 05/11/11 2122  GLUCAP 132* 119* 136*   Insulin Requirements in the past 12 hours:  0 units SSI, no insulin in TPN  Current Nutrition:  NPO; TPN day #2  Nutritional Goals:  1200-1300 kCal, 55-65 grams of protein per day; 1.5L/d  Assessment: Patient with persistent SBO, for non-operative management. NGT placed 1/25.  GI NGT output low. Pt NPO, no BM reported. Noted and appreciate RD recs.  Endo No prior  Hx of DM, noted good CBG control; MIVF d/c  Lytes/Renal Lytes ok, goal K is ~4, Mg ~2 and Phos ~3. Ca corrects to 9 for low albumin. Noted Clinimix E 5/15 intended 1/25, but electrolyte free TPN ordered.  Hepatobil LFTs WNL, chol WNL, trig slightly elevated with hx HLD, not compelling for IV fat dose adjustment. Prealbumin pending.  Px IV PPI, LMWH   Plan:  1. Will plan for a goal rate of 63ml/hr of Clinimix E 5/15 to provide an avg daily 1100 Kcal and 60gm protein once at goal to meet RD recs. Will advance TPN to goal tonight with Clinimix E 5/15. 2. MWF supplementation of MVI, TE and lipids (50ml/hr) d/t Publishing copy. 3. Will give 3 runs of KCL this AM 4. Will check BMET, Mg and Phos in AM  Djuan Talton K. Allena Katz, PharmD, BCPS.  Clinical Pharmacist Pager [phone]. 05/12/2011 10:02 AM

## 2011-05-13 ENCOUNTER — Inpatient Hospital Stay (HOSPITAL_COMMUNITY): Payer: PRIVATE HEALTH INSURANCE

## 2011-05-13 LAB — BASIC METABOLIC PANEL WITH GFR
BUN: 18 mg/dL (ref 6–23)
CO2: 27 meq/L (ref 19–32)
Calcium: 8.4 mg/dL (ref 8.4–10.5)
Chloride: 104 meq/L (ref 96–112)
Creatinine, Ser: 1.07 mg/dL (ref 0.50–1.10)
GFR calc Af Amer: 57 mL/min — ABNORMAL LOW
GFR calc non Af Amer: 49 mL/min — ABNORMAL LOW
Glucose, Bld: 114 mg/dL — ABNORMAL HIGH (ref 70–99)
Potassium: 3.9 meq/L (ref 3.5–5.1)
Sodium: 140 meq/L (ref 135–145)

## 2011-05-13 LAB — GLUCOSE, CAPILLARY: Glucose-Capillary: 124 mg/dL — ABNORMAL HIGH (ref 70–99)

## 2011-05-13 LAB — CBC
HCT: 30.1 % — ABNORMAL LOW (ref 36.0–46.0)
Platelets: 211 10*3/uL (ref 150–400)
RBC: 3.48 MIL/uL — ABNORMAL LOW (ref 3.87–5.11)
RDW: 14.3 % (ref 11.5–15.5)
WBC: 7.3 10*3/uL (ref 4.0–10.5)

## 2011-05-13 LAB — PHOSPHORUS: Phosphorus: 3.9 mg/dL (ref 2.3–4.6)

## 2011-05-13 MED ORDER — CLINIMIX E/DEXTROSE (5/15) 5 % IV SOLN
INTRAVENOUS | Status: AC
  Administered 2011-05-13: 17:00:00 via INTRAVENOUS
  Filled 2011-05-13: qty 2000

## 2011-05-13 MED ORDER — MAGNESIUM SULFATE 40 MG/ML IJ SOLN
2.0000 g | Freq: Once | INTRAMUSCULAR | Status: AC
  Administered 2011-05-13: 2 g via INTRAVENOUS
  Filled 2011-05-13: qty 50

## 2011-05-13 MED ORDER — FLEET ENEMA 7-19 GM/118ML RE ENEM
1.0000 | ENEMA | Freq: Once | RECTAL | Status: AC
  Administered 2011-05-13: 1 via RECTAL
  Filled 2011-05-13: qty 1

## 2011-05-13 NOTE — Progress Notes (Signed)
Subjective:  Kelly Hart is a 78 y/o woman with COPD and severe right heart failure on home milrinone. Presents to ER with 1-2 weeks of intermittent nausea, abdominal pain and poor po intake. No HF symptoms. On exams no evidence of fluid overload. Labs within normal limits except for UTI. CT abdomen with prominent SBO.   Dr. Jacinto Halim note reviewed. "NG drainage is less. Pain has resolved. X-rays are improved. She has really not had any flatus or stool yet. Abdominal exam is unremarkable.  We will try an enema. We will continue the NG tube for another 24 hours. We will discontinue narcotics."  She is in good spirits. "When am I going to get this tube (NG) out?"  No significant SOB in bed. No CP. No bleeding.    Objective:  Vital Signs in the last 24 hours: Temp:  [97.8 F (36.6 C)-99.1 F (37.3 C)] 97.8 F (36.6 C) (01/27 0549) Pulse Rate:  [86-106] 86  (01/27 0549) Resp:  [16-18] 18  (01/27 0549) BP: (137-155)/(41-94) 155/94 mmHg (01/27 0549) SpO2:  [90 %-100 %] 99 % (01/27 0549) Weight:  [43.2 kg (95 lb 3.8 oz)] 43.2 kg (95 lb 3.8 oz) (01/27 0549)  Intake/Output from previous day: 01/26 0701 - 01/27 0700 In: 2089.5 [P.O.:480; I.V.:460.9; IV Piggyback:160; TPN:988.7] Out: 500 [Urine:250; Emesis/NG output:250]   Physical Exam: General: Well developed, in no acute distress. Head:  Normocephalic and atraumatic. Lungs: Clear to auscultation  Heart: Normal S1 and S2.  No murmur, rubs or gallops.  Pulses: Pulses decreased in ext. Abdomen: soft,hypoactive bowel sounds. Extremities: No clubbing or cyanosis. No edema. Neurologic: Alert and oriented x 3.    Lab Results:  Basename 05/13/11 0500 05/12/11 0500  WBC 7.3 5.9  HGB 9.8* 9.6*  PLT 211 226    Basename 05/13/11 0500 05/12/11 0500  NA 140 139  K 3.9 3.7  CL 104 104  CO2 27 27  GLUCOSE 114* 114*  BUN 18 13  CREATININE 1.07 1.31*    Basename 05/12/11 0500  PROT 5.8*  ALBUMIN 2.8*  AST 18  ALT 16  ALKPHOS 66    BILITOT 0.3  BILIDIR --  IBILI --    Basename 05/12/11 0500  CHOL 101   No results found for this basename: PROTIME in the last 72 hours  Imaging: Dg Abd 2 Views  05/13/2011  *RADIOLOGY REPORT*  Clinical Data: Small bowel obstruction  ABDOMEN - 2 VIEW  Comparison: 05/12/2011; 05/10/2011; CT of the abdomen and pelvis - 05/04/2011  Findings:  There is persistent mild gaseous distension of a loop of featureless loop of bowel with the right mid hemiabdomen, measuring approximately 3.9 cm in diameter.  There is likely a minimal amount of distal colonic gas.  Interval advancement of enteric tube with tip now overlying the expected location of the mid body of the stomach.  No definite air-fluid levels seen on the provided decubitus radiograph.  No definite pneumoperitoneum.  Post cholecystectomy.  Vascular calcifications.  Lumbar spine degenerative change. Surgical clips overlying the lower lumbar spine and bilateral groins likely sequela of previous identified aortobifemoral bypass.  IMPRESSION:  1. No definite evidence of obstruction.  2.  Interval advancement of enteric tube with tip now overlying the expected location of the mid body of the stomach.  Original Report Authenticated By: Waynard Reeds, [name]   Dg Abd 2 Views  05/12/2011  *RADIOLOGY REPORT*  Clinical Data: Abdominal pain.  Follow-up small bowel obstruction.  ABDOMEN - 2 VIEW  Comparison:  05/10/2011 and 05/08/2011.  Findings: Nasogastric tube tip is in the distal esophagus and should be advanced.  Small bowel distension appears slightly improved.  There is a residual dilated loop in the left abdomen measuring 4.2 cm in diameter.  Decubitus view demonstrates scattered air-fluid levels and no pneumoperitoneum. Cholecystectomy clips and diverticula at the splenic flexure are noted.  IMPRESSION:  1.  Slight improvement in small bowel obstruction pattern.  No evidence of pneumoperitoneum. 2.  The tip of the nasogastric tube is in the distal  esophagus and should be advanced.  Original Report Authenticated By: Gerrianne Scale, [name]   Personally viewed.   Telemetry: No adverse arrhythmias  Personally viewed.    Assessment/Plan:   1) SBO  2) Severe right heart failure on home milrinone and Adcirca  3) COPD.  4) PAD s/p aortobifem graft  5) CRI, stage III-IV  6) DM2, with symptomatic hypoglycemia   Overall, improved. No SOB. Decreased NGT output. Hopeful BM/flatus. Enema. Creat. Is improved. TPN. Positive net I/O yesterday. Monitor.  Continue current cardiac meds including amiodarone IV, milrinone.    SKAINS, MARK 05/13/2011, 10:51 AM

## 2011-05-13 NOTE — Progress Notes (Signed)
Instructed family and pt. the importance of measuring the urine. Family cont. To dump the urine. Will cont to eval.

## 2011-05-13 NOTE — Progress Notes (Signed)
Patient interviewed and examined. I agree with Mr. Kelly Hart 'note.  NG drainage is less. Pain has resolved. X-rays are improved. She has really not had any flatus or stool yet. Abdominal exam is unremarkable.  We will try an enema. We will continue the NG tube for another 24 hours. We will discontinue narcotics.  Angelia Mould. Derrell Lolling, [name], [facility] General and Minimally invasive Surgery Breast and Colorectal Surgery Office:   [phone] Pager:   [phone]

## 2011-05-13 NOTE — Progress Notes (Signed)
Subjective: Film shows no evidence of obstructions, some distension up to 3.9cm in 1 loop of bowel.  No labs today. NG;250 recorded. NO flatus or BM.  Wants NG out.       Objective: Vital signs in last 24 hours: Temp:  [97.8 F (36.6 C)-99.1 F (37.3 C)] 97.8 F (36.6 C) 23-May-2022 0549) Pulse Rate:  [86-106] 86  05-23-22 0549) Resp:  [16-18] 18  May 23, 2022 0549) BP: (137-155)/(41-94) 155/94 mmHg May 23, 2022 0549) SpO2:  [90 %-100 %] 99 % 05-23-2022 0549) Weight:  [43.2 kg (95 lb 3.8 oz)] 43.2 kg (95 lb 3.8 oz) 2022/05/23 0549) Last BM Date: 05/12/11  Intake/Output from previous day: 01/26 0701 - May 23, 2022 0700 In: 2089.5 [P.O.:480; I.V.:460.9; IV Piggyback:160; TPN:988.7] Out: 500 [Urine:250; Emesis/NG output:250] Intake/Output this shift:    PE: Alert up in chair, actually feels better.  Doesn't like NG. Abd: soft, not distended, Few bowel sounds, +bruit, not really tender.    Lab Results:   Basename May 24, 2011 0500 05/12/11 0500  WBC 7.3 5.9  HGB 9.8* 9.6*  HCT 30.1* 30.2*  PLT 211 226    BMET  Basename May 24, 2011 0500 05/12/11 0500  NA 140 139  K 3.9 3.7  CL 104 104  CO2 27 27  GLUCOSE 114* 114*  BUN 18 13  CREATININE 1.07 1.31*  CALCIUM 8.4 8.1*   PT/INR No results found for this basename: LABPROT:2,INR:2 in the last 72 hours   Studies/Results: Dg Abd 2 Views  05/24/11  *RADIOLOGY REPORT*  Clinical Data: Small bowel obstruction  ABDOMEN - 2 VIEW  Comparison: 05/12/2011; 05/10/2011; CT of the abdomen and pelvis - 05/04/2011  Findings:  There is persistent mild gaseous distension of a loop of featureless loop of bowel with the right mid hemiabdomen, measuring approximately 3.9 cm in diameter.  There is likely a minimal amount of distal colonic gas.  Interval advancement of enteric tube with tip now overlying the expected location of the mid body of the stomach.  No definite air-fluid levels seen on the provided decubitus radiograph.  No definite pneumoperitoneum.  Post  cholecystectomy.  Vascular calcifications.  Lumbar spine degenerative change. Surgical clips overlying the lower lumbar spine and bilateral groins likely sequela of previous identified aortobifemoral bypass.  IMPRESSION:  1. No definite evidence of obstruction.  2.  Interval advancement of enteric tube with tip now overlying the expected location of the mid body of the stomach.  Original Report Authenticated By: Waynard Reeds, [name]   Dg Abd 2 Views  05/12/2011  *RADIOLOGY REPORT*  Clinical Data: Abdominal pain.  Follow-up small bowel obstruction.  ABDOMEN - 2 VIEW  Comparison: 05/10/2011 and 05/08/2011.  Findings: Nasogastric tube tip is in the distal esophagus and should be advanced.  Small bowel distension appears slightly improved.  There is a residual dilated loop in the left abdomen measuring 4.2 cm in diameter.  Decubitus view demonstrates scattered air-fluid levels and no pneumoperitoneum. Cholecystectomy clips and diverticula at the splenic flexure are noted.  IMPRESSION:  1.  Slight improvement in small bowel obstruction pattern.  No evidence of pneumoperitoneum. 2.  The tip of the nasogastric tube is in the distal esophagus and should be advanced.  Original Report Authenticated By: Gerrianne Scale, [name]    Anti-infectives: Anti-infectives    None     Current Facility-Administered Medications  Medication Dose Route Frequency Provider Last Rate Last Dose  . acetaminophen (TYLENOL) tablet 650 mg  650 mg Oral Q6H PRN Ron Parker, MD  Or  . acetaminophen (TYLENOL) suppository 650 mg  650 mg Rectal Q6H PRN Ron Parker, MD      . amiodarone (NEXTERONE PREMIX) 360 mg/200 mL dextrose IV infusion  30 mg/hr Intravenous Continuous Ok Anis, NP 16.7 mL/hr at 05/13/11 0327 30 mg/hr at 05/13/11 0327  . antiseptic oral rinse (BIOTENE) solution 15 mL  15 mL Mouth Rinse q12n4p Vassie Loll, MD   15 mL at 05/12/11 1534  . aspirin EC tablet 81 mg  81 mg Oral Daily Ron Parker, MD   81 mg at 05/12/11 1104  . digoxin (LANOXIN) injection 0.0625 mg  0.0625 mg Intravenous Daily Kathlen Mody, MD      . enoxaparin (LOVENOX) injection 30 mg  30 mg Subcutaneous Q24H Marylouise Stacks, PHARMD   30 mg at 05/12/11 1103  . tpn solution (CLINIMIX 5/15) 2,000 mL with multivitamins adult 10 mL, trace elements Cr-Cu-Mn-Se-Zn 1 mL infusion   Intravenous Continuous TPN Hilario Quarry Amend, PHARMD 30 mL/hr at 05/11/11 1749     And  . fat emulsion 20 % infusion 120 mL  120 mL Intravenous Continuous TPN Hilario Quarry Amend, PHARMD 5 mL/hr at 05/11/11 1750 120 mL at 05/11/11 1750  . furosemide (LASIX) 8 MG/ML solution 40 mg  40 mg Oral Daily Kathlen Mody, MD   40 mg at 05/12/11 1103  . HYDROmorphone (DILAUDID) injection 0.5-1 mg  0.5-1 mg Intravenous Q3H PRN Ron Parker, MD   1 mg at 05/10/11 2338  . levothyroxine (SYNTHROID, LEVOTHROID) injection 50 mcg  50 mcg Intravenous Daily Kathlen Mody, MD      . magnesium sulfate IVPB 2 g 50 mL  2 g Intravenous Once Christella Hartigan, PHARMD      . milrinone [facility]) infusion 200 mcg/mL (0.2 mg/mL)  0.25 mcg/kg/min Intravenous Continuous Dione Booze, MD 3.1 mL/hr at 05/12/11 1831 0.25 mcg/kg/min at 05/12/11 1831  . pantoprazole (PROTONIX) injection 40 mg  40 mg Intravenous Q24H Kathlen Mody, MD   40 mg at 05/12/11 1307  . phenol (CHLORASEPTIC) mouth spray 1 spray  1 spray Mouth/Throat PRN Vassie Loll, MD   1 spray at 05/09/11 [phone]  . potassium chloride 10 mEq in 50 mL *CENTRAL LINE* IVPB  10 mEq Intravenous Q1 Hr x 3 Meera K Patel, PHARMD   10 mEq at 05/12/11 1348  . promethazine (PHENERGAN) injection 12.5 mg  12.5 mg Intravenous Q6H PRN Maren Reamer, NP   12.5 mg at 05/11/11 1426  . simvastatin (ZOCOR) tablet 20 mg  20 mg Oral q1800 Ron Parker, MD   20 mg at 05/10/11 2023  . sodium chloride 0.9 % injection 10-40 mL  10-40 mL Intracatheter PRN Vassie Loll, MD   10 mL at 05/06/11 2119  . sodium chloride 0.9 % injection 10-40 mL   10-40 mL Intracatheter PRN Vassie Loll, MD   10 mL at 05/12/11 4696  . Tadalafil (PAH) TABS 20 mg  20 mg Oral Daily Ron Parker, MD   20 mg at 05/10/11 1036  . tpn solution (CLINIMIX E 5/15) 2,000 mL infusion   Intravenous Continuous TPN Christella Hartigan, PHARMD 50 mL/hr at 05/12/11 1734    . tpn solution (CLINIMIX E 5/15) 2,000 mL infusion   Intravenous Continuous TPN Christella Hartigan, PHARMD      . DISCONTD: amiodarone (PACERONE) tablet 200 mg  200 mg Oral Daily Ron Parker, MD   200 mg at 05/10/11 1035  . DISCONTD: insulin aspart (novoLOG)  injection 0-9 Units  0-9 Units Subcutaneous Q6H Hilario Quarry Amend, [place]        Assessment/Plan SBO, s/pAoBiFem,/Hx Cervical ca. CAD/CM/CHF on Milrinone at home./Afib/Hx. PEA PVOD COPD Hypothyroid Gerd Hypertension Renal Insuffiency  Plan:  Improving SBO, NG advanced, continue drainage for now.  Mobilize, and see how she does.  Seen by DR. Derrell Lolling.  Will give her a fleets enema, hold narcotics, check labs in AM.  Continue NG.   LOS: 9 days    Mikhail Hallenbeck 05/13/2011

## 2011-05-13 NOTE — Progress Notes (Signed)
Subjective: Patient seen and examined ,denies abdominal pain ,no BM today ,wishes to have NGT out .Also C/O difficulty extending her left ankle .  Objective: Vital signs in last 24 hours: Temp:  [97.8 F (36.6 C)-98.5 F (36.9 C)] 98 F (36.7 C) (01/27 2046) Pulse Rate:  [81-98] 87  (01/27 2046) Resp:  [16-20] 18  (01/27 2046) BP: (138-155)/(66-94) 142/69 mmHg (01/27 2046) SpO2:  [91 %-100 %] 100 % (01/27 2046) Weight:  [43.2 kg (95 lb 3.8 oz)] 43.2 kg (95 lb 3.8 oz) (01/27 0549) Weight change:  Last BM Date: 05/13/11  Intake/Output from previous day: 01/26 0701 - 01/27 0700 In: 1849.5 [P.O.:240; I.V.:460.9; IV Piggyback:160; TPN:988.7] Out: 500 [Urine:250; Emesis/NG output:250]     Physical Exam: General: Alert, awake, oriented x3, in no acute distress. NGT in place  Heart: Regular rate and rhythm, without murmurs, rubs, gallops. Lungs: Clear to auscultation bilaterally. Abdomen: Soft, nontender, nondistended, dminished  bowel sounds. Extremities: No clubbing cyanosis or edema with positive pedal pulses.left anke with no swelling ,no tenderness or erythema ,FROM. Neuro: Grossly intact, nonfocal.    Lab Results: Results for orders placed during the hospital encounter of 05/04/11 (from the past 24 hour(s))  GLUCOSE, CAPILLARY     Status: Abnormal   Collection Time   05/12/11 11:53 PM      Component Value Range   Glucose-Capillary 132 (*) 70 - 99 (mg/dL)   Comment 1 Notify RN    BASIC METABOLIC PANEL     Status: Abnormal   Collection Time   05/13/11  5:00 AM      Component Value Range   Sodium 140  135 - 145 (mEq/L)   Potassium 3.9  3.5 - 5.1 (mEq/L)   Chloride 104  96 - 112 (mEq/L)   CO2 27  19 - 32 (mEq/L)   Glucose, Bld 114 (*) 70 - 99 (mg/dL)   BUN 18  6 - 23 (mg/dL)   Creatinine, Ser 1.61  0.50 - 1.10 (mg/dL)   Calcium 8.4  8.4 - 09.6 (mg/dL)   GFR calc non Af Amer 49 (*) >90 (mL/min)   GFR calc Af Amer 57 (*) >90 (mL/min)  MAGNESIUM     Status: Normal   Collection Time   05/13/11  5:00 AM      Component Value Range   Magnesium 1.7  1.5 - 2.5 (mg/dL)  PHOSPHORUS     Status: Normal   Collection Time   05/13/11  5:00 AM      Component Value Range   Phosphorus 3.9  2.3 - 4.6 (mg/dL)  CBC     Status: Abnormal   Collection Time   05/13/11  5:00 AM      Component Value Range   WBC 7.3  4.0 - 10.5 (K/uL)   RBC 3.48 (*) 3.87 - 5.11 (MIL/uL)   Hemoglobin 9.8 (*) 12.0 - 15.0 (g/dL)   HCT 04.5 (*) 40.9 - 46.0 (%)   MCV 86.5  78.0 - 100.0 (fL)   MCH 28.2  26.0 - 34.0 (pg)   MCHC 32.6  30.0 - 36.0 (g/dL)   RDW 81.1  91.4 - 78.2 (%)   Platelets 211  150 - 400 (K/uL)  GLUCOSE, CAPILLARY     Status: Abnormal   Collection Time   05/13/11  5:48 AM      Component Value Range   Glucose-Capillary 124 (*) 70 - 99 (mg/dL)    Studies/Results:   Medications:    . antiseptic oral rinse  15 mL Mouth Rinse q12n4p  . aspirin EC  81 mg Oral Daily  . digoxin  0.0625 mg Intravenous Daily  . enoxaparin (LOVENOX) injection  30 mg Subcutaneous Q24H  . furosemide  40 mg Oral Daily  . levothyroxine  50 mcg Intravenous Daily  . magnesium sulfate 1 - 4 g bolus IVPB  2 g Intravenous Once  . pantoprazole (PROTONIX) IV  40 mg Intravenous Q24H  . simvastatin  20 mg Oral q1800  . sodium phosphate  1 enema Rectal Once  . Tadalafil (PAH)  20 mg Oral Daily  . DISCONTD: insulin aspart  0-9 Units Subcutaneous Q6H    acetaminophen, acetaminophen, phenol, promethazine, sodium chloride, sodium chloride, DISCONTD: HYDROmorphone     . amiodarone (NEXTERONE PREMIX) 360 mg/200 mL dextrose 30 mg/hr (05/13/11 1522)  . milrinone 0.25 mcg/kg/min (05/13/11 1829)  . TPN (CLINIMIX) +/- additives 50 mL/hr at 05/12/11 1734  . TPN (CLINIMIX) +/- additives 50 mL/hr at 05/13/11 1724    Assessment/Plan: 1-SBO: high grade most likely due to adhesions.Improved today on x ray, plans as per surgery ,enema today and hopefully NGT out tomorrow. 2-Pulmonary hypertension and RHF (right  heart failure): continue current management. Well compensated. Appreciate Cardiology follow up. 3-Hypokalemia:resolved,replete potassium as needed.   4-Left hydronephrosis: appears to be  2/2 to femoral  bypass into left leg.urology evaluation  when more stable  5-DVT: SCD's. 6.?difficulty of extension of Left ankle : Check X ray left ankle.PT consult      LOS: 9 days   Etheline Geppert 05/13/2011, 9:33 PM

## 2011-05-13 NOTE — Progress Notes (Signed)
PARENTERAL NUTRITION CONSULT NOTE - FOLLOW UP  Pharmacy Consult for TPN Indication: SBO  Allergies  Allergen Reactions  . Iodine Swelling  . Lisinopril Swelling    throat  . Omnipaque (Iohexol) Other (See Comments)    Unknown  . Penicillins Swelling    Patient Measurements: Height: 5\' 4"  (162.6 cm) Weight: 95 lb 3.8 oz (43.2 kg) (bed scale) IBW/kg (Calculated) : 54.7   Vital Signs: Temp: 97.8 F (36.6 C) (01/27 0549) Temp src: Oral (01/27 0549) BP: 155/94 mmHg (01/27 0549) Pulse Rate: 86  (01/27 0549) Intake/Output from previous day: 01/26 0701 - 01/27 0700 In: 2089.5 [P.O.:480; I.V.:460.9; IV Piggyback:160; TPN:988.7] Out: 500 [Urine:250; Emesis/NG output:250] Intake/Output from this shift:    Labs:  Basename 05/13/11 0500 05/12/11 0500 05/11/11 0925  WBC 7.3 5.9 8.9  HGB 9.8* 9.6* 10.9*  HCT 30.1* 30.2* 34.0*  PLT 211 226 224  APTT -- -- --  INR -- -- --     Basename 05/13/11 0500 05/12/11 0500 05/11/11 0925  NA 140 139 143  K 3.9 3.7 3.7  CL 104 104 105  CO2 27 27 29   GLUCOSE 114* 114* 99  BUN 18 13 8   CREATININE 1.07 1.31* 1.34*  LABCREA -- -- --  CREAT24HRUR -- -- --  CALCIUM 8.4 8.1* 8.9  MG 1.7 2.2 1.4*  PHOS 3.9 3.1 3.1  PROT -- 5.8* --  ALBUMIN -- 2.8* --  AST -- 18 --  ALT -- 16 --  ALKPHOS -- 66 --  BILITOT -- 0.3 --  BILIDIR -- -- --  IBILI -- -- --  PREALBUMIN -- 13.5* --  TRIG -- 172* --  CHOLHDL -- -- --  CHOL -- 101 --   Estimated Creatinine Clearance: 30.5 ml/min (by C-G formula based on Cr of 1.07).    Basename 05/13/11 0548 05/12/11 2353 05/12/11 1608  GLUCAP 124* 132* 123*   Insulin Requirements in the past 12 hours:  0 units SSI, no insulin in TPN  Current Nutrition:  NPO; TPN day #3 Current TPN provides avg daily 1100 Kcal and 60gm protein  Nutritional Goals:  1200-1300 kCal, 55-65 grams of protein per day; 1.5L/d  Assessment: Patient with persistent SBO, for non-operative management. NGT placed 1/25.  GI  NGT increased s/p repositioned tube. Pt NPO, no BM reported. Noted and appreciate RD recs.  Endo No prior Hx of DM, noted good CBG control; MIVF d/c  Lytes/Renal Lytes ok, goal K is ~4, Mg ~2 and Phos ~3. Ca corrects to 9 for low albumin.   Hepatobil LFTs WNL, chol WNL, trig slightly elevated with hx HLD, not compelling for IV fat dose adjustment. Prealbumin slightly below goal 18-40; likely d/t inflammation and low PO intake  Px IV PPI, LMWH   Plan:  1. Will continue Clinimix E 5/15 at goal rate of 4ml/hr. 2. MWF supplementation of MVI, TE and lipids (48ml/hr) d/t Publishing copy. 3. Will give 2g IV Mg this AM 4. Will f/up AM labs 5. Will d/c SSI and CBG checks.  Cassidee Deats K. Allena Katz, PharmD, BCPS.  Clinical Pharmacist Pager [phone]. 05/13/2011 9:03 AM

## 2011-05-14 ENCOUNTER — Inpatient Hospital Stay (HOSPITAL_COMMUNITY): Payer: PRIVATE HEALTH INSURANCE

## 2011-05-14 LAB — COMPREHENSIVE METABOLIC PANEL
ALT: 11 U/L (ref 0–35)
AST: 16 U/L (ref 0–37)
Albumin: 2.7 g/dL — ABNORMAL LOW (ref 3.5–5.2)
Calcium: 8.4 mg/dL (ref 8.4–10.5)
Chloride: 100 mEq/L (ref 96–112)
Creatinine, Ser: 0.98 mg/dL (ref 0.50–1.10)
Sodium: 138 mEq/L (ref 135–145)

## 2011-05-14 LAB — DIFFERENTIAL
Lymphocytes Relative: 15 % (ref 12–46)
Lymphs Abs: 0.9 10*3/uL (ref 0.7–4.0)
Monocytes Absolute: 0.5 10*3/uL (ref 0.1–1.0)
Monocytes Relative: 8 % (ref 3–12)
Neutro Abs: 4.5 10*3/uL (ref 1.7–7.7)

## 2011-05-14 LAB — CBC
HCT: 30.2 % — ABNORMAL LOW (ref 36.0–46.0)
Hemoglobin: 9.8 g/dL — ABNORMAL LOW (ref 12.0–15.0)
MCV: 86 fL (ref 78.0–100.0)
WBC: 6.1 10*3/uL (ref 4.0–10.5)

## 2011-05-14 LAB — PHOSPHORUS: Phosphorus: 4.8 mg/dL — ABNORMAL HIGH (ref 2.3–4.6)

## 2011-05-14 MED ORDER — M.V.I. ADULT IV INJ
INJECTION | INTRAVENOUS | Status: AC
  Administered 2011-05-14: 18:00:00 via INTRAVENOUS
  Filled 2011-05-14: qty 2000

## 2011-05-14 MED ORDER — FAT EMULSION 20 % IV EMUL
250.0000 mL | INTRAVENOUS | Status: AC
  Administered 2011-05-14: 250 mL via INTRAVENOUS
  Filled 2011-05-14: qty 250

## 2011-05-14 MED ORDER — PANTOPRAZOLE SODIUM 40 MG PO TBEC
40.0000 mg | DELAYED_RELEASE_TABLET | Freq: Every day | ORAL | Status: DC
  Administered 2011-05-14 – 2011-05-19 (×6): 40 mg via ORAL
  Filled 2011-05-14 (×6): qty 1

## 2011-05-14 MED ORDER — POTASSIUM CHLORIDE CRYS ER 20 MEQ PO TBCR
30.0000 meq | EXTENDED_RELEASE_TABLET | Freq: Three times a day (TID) | ORAL | Status: AC
  Administered 2011-05-14 – 2011-05-15 (×3): 30 meq via ORAL
  Filled 2011-05-14 (×3): qty 1

## 2011-05-14 MED ORDER — FUROSEMIDE 40 MG PO TABS
40.0000 mg | ORAL_TABLET | Freq: Every day | ORAL | Status: DC
  Administered 2011-05-15 – 2011-05-17 (×3): 40 mg via ORAL
  Filled 2011-05-14 (×5): qty 1

## 2011-05-14 MED ORDER — LEVOTHYROXINE SODIUM 100 MCG PO TABS
100.0000 ug | ORAL_TABLET | Freq: Every day | ORAL | Status: DC
  Administered 2011-05-15 – 2011-05-19 (×5): 100 ug via ORAL
  Filled 2011-05-14 (×6): qty 1

## 2011-05-14 MED ORDER — DIGOXIN 0.0625 MG HALF TABLET
0.0625 mg | ORAL_TABLET | Freq: Every day | ORAL | Status: DC
  Administered 2011-05-14 – 2011-05-19 (×6): 0.0625 mg via ORAL
  Filled 2011-05-14 (×6): qty 1

## 2011-05-14 NOTE — Progress Notes (Signed)
Subjective: Going to xray, c/o left ankle dragging when she walks. NG cannister shows 350 ml. Yesterday 0600-1900hrs, then another 50-166ml since then  +flatus, +BM with enema.  Again she really wants NG out.      Objective: Vital signs in last 24 hours: Temp:  [98 F (36.7 C)-98.5 F (36.9 C)] 98 F (36.7 C) (01/28 0523) Pulse Rate:  [78-89] 78  (01/28 0523) Resp:  [18-20] 18  (01/28 0523) BP: (128-147)/(65-81) 130/65 mmHg (01/28 0523) SpO2:  [100 %] 100 % (01/28 0523) Weight:  [44.18 kg (97 lb 6.4 oz)] 44.18 kg (97 lb 6.4 oz) (01/28 0523) Last BM Date: 24-May-2011  Intake/Output from previous day: 05-23-22 0701 - 01/28 0700 In: 1691.2 [I.V.:482.1; IV Piggyback:10; TPN:1199.2] Out: 1400 [Urine:1400] Intake/Output this shift: Total I/O In: 50 [NG/GT:50] Out: -   PE: Alert back from x-ray, Chest clear anteriorly,  Abd: Soft, minimal distension, not really tender, +BS,+flatus. LE: no edema, swelling   Lab Results:   Basename 05/14/11 0500 2011/05/24 0500  WBC 6.1 7.3  HGB 9.8* 9.8*  HCT 30.2* 30.1*  PLT 222 211    BMET  Basename 05/14/11 0500 2011/05/24 0500  NA 138 140  K 3.5 3.9  CL 100 104  CO2 28 27  GLUCOSE 117* 114*  BUN 20 18  CREATININE 0.98 1.07  CALCIUM 8.4 8.4   PT/INR No results found for this basename: LABPROT:2,INR:2 in the last 72 hours  Lab 05/14/11 0500 05/12/11 0500  AST 16 18  ALT 11 16  ALKPHOS 66 66  BILITOT 0.2* 0.3  PROT 5.9* 5.8*  ALBUMIN 2.7* 2.8*     Lab 05/14/11 0500 05/12/11 0500  AST 16 18  ALT 11 16  ALKPHOS 66 66  BILITOT 0.2* 0.3  PROT 5.9* 5.8*  ALBUMIN 2.7* 2.8*    Studies/Results: Dg Abd 2 Views  05/24/2011  *RADIOLOGY REPORT*  Clinical Data: Small bowel obstruction  ABDOMEN - 2 VIEW  Comparison: 05/12/2011; 05/10/2011; CT of the abdomen and pelvis - 05/04/2011  Findings:  There is persistent mild gaseous distension of a loop of featureless loop of bowel with the right mid hemiabdomen, measuring approximately 3.9  cm in diameter.  There is likely a minimal amount of distal colonic gas.  Interval advancement of enteric tube with tip now overlying the expected location of the mid body of the stomach.  No definite air-fluid levels seen on the provided decubitus radiograph.  No definite pneumoperitoneum.  Post cholecystectomy.  Vascular calcifications.  Lumbar spine degenerative change. Surgical clips overlying the lower lumbar spine and bilateral groins likely sequela of previous identified aortobifemoral bypass.  IMPRESSION:  1. No definite evidence of obstruction.  2.  Interval advancement of enteric tube with tip now overlying the expected location of the mid body of the stomach.  Original Report Authenticated By: Waynard Reeds, [name]   Dg Abd 2 Views  05/12/2011  *RADIOLOGY REPORT*  Clinical Data: Abdominal pain.  Follow-up small bowel obstruction.  ABDOMEN - 2 VIEW  Comparison: 05/10/2011 and 05/08/2011.  Findings: Nasogastric tube tip is in the distal esophagus and should be advanced.  Small bowel distension appears slightly improved.  There is a residual dilated loop in the left abdomen measuring 4.2 cm in diameter.  Decubitus view demonstrates scattered air-fluid levels and no pneumoperitoneum. Cholecystectomy clips and diverticula at the splenic flexure are noted.  IMPRESSION:  1.  Slight improvement in small bowel obstruction pattern.  No evidence of pneumoperitoneum. 2.  The tip of  the nasogastric tube is in the distal esophagus and should be advanced.  Original Report Authenticated By: Gerrianne Scale, [name]    Anti-infectives: Anti-infectives    None     Current Facility-Administered Medications  Medication Dose Route Frequency Provider Last Rate Last Dose  . acetaminophen (TYLENOL) tablet 650 mg  650 mg Oral Q6H PRN Ron Parker, MD       Or  . acetaminophen (TYLENOL) suppository 650 mg  650 mg Rectal Q6H PRN Ron Parker, MD      . amiodarone (NEXTERONE PREMIX) 360 mg/200 mL dextrose  IV infusion  30 mg/hr Intravenous Continuous Ok Anis, NP 16.7 mL/hr at 05/14/11 0257 30 mg/hr at 05/14/11 0257  . antiseptic oral rinse (BIOTENE) solution 15 mL  15 mL Mouth Rinse q12n4p Vassie Loll, MD   15 mL at 05/13/11 1858  . aspirin EC tablet 81 mg  81 mg Oral Daily Ron Parker, MD   81 mg at 05/13/11 1058  . digoxin (LANOXIN) injection 0.0625 mg  0.0625 mg Intravenous Daily Kathlen Mody, MD      . enoxaparin (LOVENOX) injection 30 mg  30 mg Subcutaneous Q24H Marylouise Stacks, PHARMD   30 mg at 05/13/11 1057  . furosemide (LASIX) 8 MG/ML solution 40 mg  40 mg Oral Daily Kathlen Mody, MD   40 mg at 05/13/11 1058  . levothyroxine (SYNTHROID, LEVOTHROID) injection 50 mcg  50 mcg Intravenous Daily Kathlen Mody, MD   50 mcg at 05/13/11 1057  . magnesium sulfate IVPB 2 g 50 mL  2 g Intravenous Once Christella Hartigan, PHARMD   2 g at 05/13/11 1057  . milrinone [facility]) infusion 200 mcg/mL (0.2 mg/mL)  0.25 mcg/kg/min Intravenous Continuous Dione Booze, MD 3.1 mL/hr at 05/13/11 1829 0.25 mcg/kg/min at 05/13/11 1829  . pantoprazole (PROTONIX) injection 40 mg  40 mg Intravenous Q24H Kathlen Mody, MD   40 mg at 05/13/11 1829  . phenol (CHLORASEPTIC) mouth spray 1 spray  1 spray Mouth/Throat PRN Vassie Loll, MD   1 spray at 05/09/11 [phone]  . promethazine (PHENERGAN) injection 12.5 mg  12.5 mg Intravenous Q6H PRN Maren Reamer, NP   12.5 mg at 05/11/11 1426  . simvastatin (ZOCOR) tablet 20 mg  20 mg Oral q1800 Ron Parker, MD   20 mg at 05/10/11 2023  . sodium chloride 0.9 % injection 10-40 mL  10-40 mL Intracatheter PRN Vassie Loll, MD   10 mL at 05/06/11 2119  . sodium chloride 0.9 % injection 10-40 mL  10-40 mL Intracatheter PRN Vassie Loll, MD   10 mL at 05/13/11 1730  . sodium phosphate (FLEET) 7-19 GM/118ML enema 1 enema  1 enema Rectal Once Sherrie George, PA   1 enema at 05/13/11 1144  . Tadalafil (PAH) TABS 20 mg  20 mg Oral Daily Ron Parker, MD   20 mg at  05/10/11 1036  . tpn solution (CLINIMIX E 5/15) 2,000 mL infusion   Intravenous Continuous TPN Christella Hartigan, PHARMD 50 mL/hr at 05/12/11 1734    . tpn solution (CLINIMIX E 5/15) 2,000 mL infusion   Intravenous Continuous TPN Christella Hartigan, PHARMD 50 mL/hr at 05/13/11 1724    . DISCONTD: HYDROmorphone (DILAUDID) injection 0.5-1 mg  0.5-1 mg Intravenous Q3H PRN Ron Parker, MD   1 mg at 05/10/11 2338  . DISCONTD: insulin aspart (novoLOG) injection 0-9 Units  0-9 Units Subcutaneous Q6H Hilario Quarry [place], [place]  Assessment/Plan SBO, s/pAoBiFem,/Hx Cervical ca. Flatus, +BM with enema. CAD/CM/CHF on Milrinone at home./Afib/Hx. PEA PVOD COPD Hypothyroid Gerd Hypertension Renal Insuffiency  Plan:  Improving SBO,  Will clamp NG for now and see how she does, discuss d/cing tube later.  LOS: 10 days    Keymani Glynn 05/14/2011

## 2011-05-14 NOTE — Progress Notes (Signed)
PARENTERAL NUTRITION CONSULT NOTE - FOLLOW UP  Pharmacy Consult for TPN Indication: SBO  Allergies  Allergen Reactions  . Iodine Swelling  . Lisinopril Swelling    throat  . Omnipaque (Iohexol) Other (See Comments)    Unknown  . Penicillins Swelling    Patient Measurements: Height: 5\' 4"  (162.6 cm) Weight: 97 lb 6.4 oz (44.18 kg) (scale C) IBW/kg (Calculated) : 54.7   Vital Signs: Temp: 98 F (36.7 C) (01/28 0523) Temp src: Oral (01/28 0523) BP: 130/65 mmHg (01/28 0523) Pulse Rate: 78  (01/28 0523) Intake/Output from previous day: 01/27 0701 - 01/28 0700 In: 1691.2 [I.V.:482.1; IV Piggyback:10; TPN:1199.2] Out: 1400 [Urine:1400] Intake/Output from this shift: Total I/O In: 50 [NG/GT:50] Out: -   Labs:  Basename 05/14/11 0500 05/13/11 0500 05/12/11 0500  WBC 6.1 7.3 5.9  HGB 9.8* 9.8* 9.6*  HCT 30.2* 30.1* 30.2*  PLT 222 211 226  APTT -- -- --  INR -- -- --     Basename 05/14/11 0500 05/13/11 0500 05/12/11 0500  NA 138 140 139  K 3.5 3.9 3.7  CL 100 104 104  CO2 28 27 27   GLUCOSE 117* 114* 114*  BUN 20 18 13   CREATININE 0.98 1.07 1.31*  LABCREA -- -- --  CREAT24HRUR -- -- --  CALCIUM 8.4 8.4 8.1*  MG 1.9 1.7 2.2  PHOS 4.8* 3.9 3.1  PROT 5.9* -- 5.8*  ALBUMIN 2.7* -- 2.8*  AST 16 -- 18  ALT 11 -- 16  ALKPHOS 66 -- 66  BILITOT 0.2* -- 0.3  BILIDIR -- -- --  IBILI -- -- --  PREALBUMIN -- -- 13.5*  TRIG -- -- 172*  CHOLHDL -- -- --  CHOL -- -- 101   Estimated Creatinine Clearance: 34.1 ml/min (by C-G formula based on Cr of 0.98).    Basename 05/13/11 0548 05/12/11 2353 05/12/11 1608  GLUCAP 124* 132* 123*   Insulin Requirements in the past 12 hours:  CBGs and SSI d/c'd  Current Nutrition:  NPO; Current TPN provides avg daily 1100 Kcal and 60gm protein  Nutritional Goals:  1200-1300 kCal, 55-65 grams of protein per day; 1.5L/d  Assessment: Patient with persistent SBO, for non-operative management. NGT placed 1/25.  GI SBO. recurrent  N/V and distention when diet advanced to solids. Non-surg mngmnt; Wants NG out. Try clamping. +flatus/BM  Endo CBGs and SSI d/c'd. IV Synthroid can be changed to po/NG  Lytes/Renal Phos up from 3.9 to 4.8  Hepatobil LFTs WNL, chol WNL, trig slightly elevated with hx HLD. Prealbumin slightly below goal 18-40; likely d/t inflammation and low PO intake  Px IV PPI, LMWH   Plan:  1. Will continue Clinimix E 5/15 at goal rate of 45ml/hr. 2. MWF supplementation of MVI, TE and lipids (57ml/hr) d/t Publishing copy. 3. Recheck phos in am 4. Change several IV to po meds.

## 2011-05-14 NOTE — Clinical Documentation Improvement (Signed)
MALNUTRITION DOCUMENTATION CLARIFICATION  THIS DOCUMENT IS NOT A PERMANENT PART OF THE MEDICAL RECORD  TO RESPOND TO THE THIS QUERY, FOLLOW THE INSTRUCTIONS BELOW:  1. If needed, update documentation for the patient's encounter via the notes activity.  2. Access this query again and click edit on the In [employer].  3. After updating, or not, click F2 to complete all highlighted (required) fields concerning your review. Select "additional documentation in the medical record" OR "no additional documentation provided".  4. Click Sign note button.  5. The deficiency will fall out of your In Basket *Please let us know if you are not able to complete this workflow by phone or e-mail (listed below).  Please update your documentation within the medical record to reflect your response to this query.                                                                                        05/14/11   Dear [facility],  In a better effort to capture your patient's severity of illness, reflect appropriate length of stay and utilization of resources, a review of the patient medical record has revealed the following indicators.    Based on your clinical judgment, please clarify and document in a progress note and/or discharge summary the clinical condition associated with the following supporting information:   Possible Clinical Conditions?  _______Severe Malnutrition   _______Severe Protein Calorie Malnutrition _______Emaciation  _______Cachexia   _______Other Condition________________ _______Cannot clinically determine     Supporting Information: Risk Factors:  Signs & Symptoms: Ht: 5'4"(162.6cm)     Wt: 95lb,14.4oz(43.5kg)  BMI: 16.46  Weight  Loss: 5% in one week prior to admit.  Treatment:  Enteral Feeding:  TPN with [place]ax 5/15 @ 15ml/hr and 20% fat emulsion @ 49ml/hr, M-W-F.  Nutrition Consult: 05/11/11: Per RD:  Patient at risk of refeeding syndrome given severe  malnutrition in the presence of poor po intake > 5 days. Severe malnutrition in the context of acute illness; underweight.   You may use possible, probable, or suspect with inpatient documentation. possible, probable, suspected diagnoses MUST be documented at the time of discharge  Reviewed:  Physician documented severe malnutrition in the progress notes on 05/15/11.  Thank You,  Rodman Pickle, RN,BSN,  Clinical Documentation Specialist:  Pager: [phone]  Health Information Management [place]

## 2011-05-14 NOTE — Progress Notes (Signed)
Pt's NGT clamped x4 hours then placed back on low int wall suction per order. Pt denies any nausea or abdominal distension. When placed back on suction, no drainage noted. Pt NGT clamped again and will remain clamped overnight per MD's note. Will continue to monitor.

## 2011-05-14 NOTE — Progress Notes (Signed)
Utilization Review Completed.Avett Reineck T1/28/2013   

## 2011-05-14 NOTE — Progress Notes (Signed)
Physical Therapy Treatment Patient Details Name: Kelly Hart MRN: [id number] DOB: [birth year] Today's Date: 05/14/2011  PT Assessment/Plan  PT - Assessment/Plan Comments on Treatment Session: Pt now presenting with L foot drop.  Xray performed this am and negative for fracture.  Pt able to compensate with excessive L hip flexion/steppage gait on L.  No LOB related to L LE.  Pt reports L foot drop x 3 days.  Pt motivated to increase mobility but currently limited by lines/tubes.  Pt able to maintain SaO2.98% with ambulation today on 4L. PT Plan: Discharge plan remains appropriate Follow Up Recommendations: No PT follow up Equipment Recommended: None recommended by PT PT Goals  Acute Rehab PT Goals PT Goal: Sit to Stand - Progress: Progressing toward goal PT Goal: Stand to Sit - Progress: Progressing toward goal PT Goal: Ambulate - Progress: Progressing toward goal  PT Treatment Precautions/Restrictions  Precautions Required Braces or Orthoses: No Restrictions Weight Bearing Restrictions: No Other Position/Activity Restrictions: watch O2 sats, ambulate with O2 Mobility (including Balance) Bed Mobility Bed Mobility: Yes Supine to Sit: 6: Modified independent (Device/Increase time);With rails;HOB elevated (Comment degrees) Transfers Transfers: Yes Sit to Stand: 6: Modified independent (Device/Increase time);With upper extremity assist;From bed Stand to Sit: 6: Modified independent (Device/Increase time);With upper extremity assist;To chair/3-in-1 Ambulation/Gait Ambulation/Gait: Yes Ambulation/Gait Assistance: 5: Supervision Ambulation/Gait Assistance Details (indicate cue type and reason): Pt now presenting with L LE foot drop.  No LOB with ambulation.  Amb. on 4L o2. Ambulation Distance (Feet): 300 Feet Assistive device: None Gait Pattern: Step-to pattern;Left steppage;Left foot flat Gait velocity: WFL Stairs: No Wheelchair Mobility Wheelchair Mobility: No  Posture/Postural  Control Posture/Postural Control: No significant limitations Balance Balance Assessed: Yes Dynamic Sitting Balance Dynamic Sitting - Balance Support: Feet supported Dynamic Sitting - Level of Assistance: 6: Modified independent (Device/Increase time) Reach (Patient is able to reach ___ inches to right, left, forward, back): 12 Dynamic Standing Balance Dynamic Standing - Balance Support: No upper extremity supported Dynamic Standing - Level of Assistance: 6: Modified independent (Device/Increase time) Exercise    End of Session PT - End of Session Equipment Utilized During Treatment:  (O2) Activity Tolerance: Patient tolerated treatment well Patient left: in chair;with call bell in reach;with family/visitor present Nurse Communication: Mobility status for ambulation General Behavior During Session: [facility] for tasks performed Cognition: [facility] for tasks performed  Newell Coral 05/14/2011, 9:58 AM  Newell Coral, PTA

## 2011-05-14 NOTE — Progress Notes (Signed)
Subjective: Patient seen and examined, denies any complaints and want NG tube out. She has no  BM yet  Objective: Vital signs in last 24 hours: Temp:  [98 F (36.7 C)-98.3 F (36.8 C)] 98 F (36.7 C) (01/28 1529) Pulse Rate:  [78-87] 80  (01/28 1529) Resp:  [16-18] 16  (01/28 1529) BP: (128-142)/(65-81) 135/77 mmHg (01/28 1529) SpO2:  [100 %] 100 % (01/28 1529) Weight:  [44.18 kg (97 lb 6.4 oz)] 44.18 kg (97 lb 6.4 oz) (01/28 0523) Weight change: 0.98 kg (2 lb 2.6 oz) Last BM Date: 05/13/11  Intake/Output from previous day: 01/27 0701 - 01/28 0700 In: 1691.2 [I.V.:482.1; IV Piggyback:10; TPN:1199.2] Out: 1400 [Urine:1400] Total I/O In: 654.9 [I.V.:157.4; NG/GT:100; TPN:397.5] Out: 850 [Urine:800; Emesis/NG output:50]   Physical Exam: General: Alert, awake, oriented x3, in no acute distress.  NGT in place  Heart: Regular rate and rhythm, without murmurs, rubs, gallops.  Lungs: Clear to auscultation bilaterally.  Abdomen: Soft, nontender, nondistended, dminished bowel sounds.  Extremities: No clubbing cyanosis or edema with positive pedal pulses.left anke with no swelling ,no tenderness or erythema ,FROM.  Neuro: Grossly intact, nonfocal     Lab Results: Results for orders placed during the hospital encounter of 05/04/11 (from the past 24 hour(s))  COMPREHENSIVE METABOLIC PANEL     Status: Abnormal   Collection Time   05/14/11  5:00 AM      Component Value Range   Sodium 138  135 - 145 (mEq/L)   Potassium 3.5  3.5 - 5.1 (mEq/L)   Chloride 100  96 - 112 (mEq/L)   CO2 28  19 - 32 (mEq/L)   Glucose, Bld 117 (*) 70 - 99 (mg/dL)   BUN 20  6 - 23 (mg/dL)   Creatinine, Ser 1.47  0.50 - 1.10 (mg/dL)   Calcium 8.4  8.4 - 82.9 (mg/dL)   Total Protein 5.9 (*) 6.0 - 8.3 (g/dL)   Albumin 2.7 (*) 3.5 - 5.2 (g/dL)   AST 16  0 - 37 (U/L)   ALT 11  0 - 35 (U/L)   Alkaline Phosphatase 66  39 - 117 (U/L)   Total Bilirubin 0.2 (*) 0.3 - 1.2 (mg/dL)   GFR calc non Af Amer 55 (*) >90  (mL/min)   GFR calc Af Amer 63 (*) >90 (mL/min)  MAGNESIUM     Status: Normal   Collection Time   05/14/11  5:00 AM      Component Value Range   Magnesium 1.9  1.5 - 2.5 (mg/dL)  PHOSPHORUS     Status: Abnormal   Collection Time   05/14/11  5:00 AM      Component Value Range   Phosphorus 4.8 (*) 2.3 - 4.6 (mg/dL)  CBC     Status: Abnormal   Collection Time   05/14/11  5:00 AM      Component Value Range   WBC 6.1  4.0 - 10.5 (K/uL)   RBC 3.51 (*) 3.87 - 5.11 (MIL/uL)   Hemoglobin 9.8 (*) 12.0 - 15.0 (g/dL)   HCT 56.2 (*) 13.0 - 46.0 (%)   MCV 86.0  78.0 - 100.0 (fL)   MCH 27.9  26.0 - 34.0 (pg)   MCHC 32.5  30.0 - 36.0 (g/dL)   RDW 86.5  78.4 - 69.6 (%)   Platelets 222  150 - 400 (K/uL)  DIFFERENTIAL     Status: Normal   Collection Time   05/14/11  5:00 AM      Component Value  Range   Neutrophils Relative 74  43 - 77 (%)   Neutro Abs 4.5  1.7 - 7.7 (K/uL)   Lymphocytes Relative 15  12 - 46 (%)   Lymphs Abs 0.9  0.7 - 4.0 (K/uL)   Monocytes Relative 8  3 - 12 (%)   Monocytes Absolute 0.5  0.1 - 1.0 (K/uL)   Eosinophils Relative 2  0 - 5 (%)   Eosinophils Absolute 0.1  0.0 - 0.7 (K/uL)   Basophils Relative 0  0 - 1 (%)   Basophils Absolute 0.0  0.0 - 0.1 (K/uL)    Studies/Results: Dg Ankle 2 Views Left  05/14/2011  *RADIOLOGY REPORT*  Clinical Data: Left ankle pain.  LEFT ANKLE - 2 VIEW  Comparison: None  Findings: There is no evidence of fracture, subluxation or dislocation. Mild degenerative changes along the medial tibiotalar joint noted. No focal bony lesions are present. There is no evidence of ankle effusion.  IMPRESSION: No evidence of acute abnormality.  Mild tibiotalar joint degenerative changes.  Original Report Authenticated By: Rosendo Gros, [name]   Dg Abd 2 Views  05/13/2011  *RADIOLOGY REPORT*  Clinical Data: Small bowel obstruction  ABDOMEN - 2 VIEW  Comparison: 05/12/2011; 05/10/2011; CT of the abdomen and pelvis - 05/04/2011  Findings:  There is persistent mild  gaseous distension of a loop of featureless loop of bowel with the right mid hemiabdomen, measuring approximately 3.9 cm in diameter.  There is likely a minimal amount of distal colonic gas.  Interval advancement of enteric tube with tip now overlying the expected location of the mid body of the stomach.  No definite air-fluid levels seen on the provided decubitus radiograph.  No definite pneumoperitoneum.  Post cholecystectomy.  Vascular calcifications.  Lumbar spine degenerative change. Surgical clips overlying the lower lumbar spine and bilateral groins likely sequela of previous identified aortobifemoral bypass.  IMPRESSION:  1. No definite evidence of obstruction.  2.  Interval advancement of enteric tube with tip now overlying the expected location of the mid body of the stomach.  Original Report Authenticated By: Waynard Reeds, [name]    Medications:    . antiseptic oral rinse  15 mL Mouth Rinse q12n4p  . aspirin EC  81 mg Oral Daily  . digoxin  0.0625 mg Oral Daily  . enoxaparin (LOVENOX) injection  30 mg Subcutaneous Q24H  . furosemide  40 mg Oral Daily  . levothyroxine  100 mcg Oral QAC breakfast  . pantoprazole  40 mg Oral Q1200  . potassium chloride  30 mEq Oral TID  . simvastatin  20 mg Oral q1800  . Tadalafil (PAH)  20 mg Oral Daily  . DISCONTD: digoxin  0.0625 mg Intravenous Daily  . DISCONTD: furosemide  40 mg Oral Daily  . DISCONTD: levothyroxine  50 mcg Intravenous Daily  . DISCONTD: pantoprazole (PROTONIX) IV  40 mg Intravenous Q24H    acetaminophen, acetaminophen, phenol, promethazine, sodium chloride, sodium chloride     . amiodarone (NEXTERONE PREMIX) 360 mg/200 mL dextrose 30 mg/hr (05/14/11 1357)  . TPN (CLINIMIX) +/- additives     And  . fat emulsion    . milrinone 0.25 mcg/kg/min (05/13/11 1829)  . TPN (CLINIMIX) +/- additives 50 mL/hr at 05/12/11 1734  . TPN (CLINIMIX) +/- additives 50 mL/hr at 05/13/11 1724    Assessment/Plan: 1-SBO: high grade most  likely due to adhesions.Improved, plans as per surgery to clamp NGT overnight and repeat abdominal x-ray in the a.m. 2-Pulmonary hypertension and  RHF (right heart failure): continue current management. Well compensated. 3-Hypokalemia:resolved,replete potassium as needed.  4-Left hydronephrosis: appears to be 2/2 to femoral bypass into left leg.urology evaluation when more stable  5-DVT: SCD's.  6.difficulty of extension of Left ankle :  X ray left ankle showed no fracture.she was seen by PT , no followup recommended.    LOS: 10 days   Benedicta Sultan 05/14/2011, 5:19 PM

## 2011-05-14 NOTE — Progress Notes (Signed)
Patient interviewed and examined. I discussed her care with her 2 daughters she seems to be feeling well. She once the NG tube out. She denies pain. States that she is hungry. Stool out of the enema. Passing a little flatus.  NG drainage has been very thin and watery and only mild cancer.  Abdomen soft and nontender. Hypoactive bowel sounds. Borderline distended.  Plan: Because the NG tube had to be reinserted this admission, we're going to leave a clamped overnight and get abdominal x-rays tomorrow. If everything continues to improve will consider taking the NG tube out tomorrow.also will supplement KCl in IVF's.  Angelia Mould. Derrell Lolling, [name], [facility] General and Minimally invasive Surgery Breast and Colorectal Surgery Office:   [phone] Pager:   [phone]

## 2011-05-14 NOTE — Plan of Care (Signed)
Problem: Phase I Progression Outcomes Goal: EF % per last Echo/documented,Core Reminder form on chart Outcome: Completed/Met Date Met:  05/14/11 50-55% 05/10/11

## 2011-05-14 NOTE — Progress Notes (Signed)
Advanced Heart Failure Rounding Note   Subjective:    Kelly Hart is a 78 y/o woman with COPD and severe right heart failure on home milrinone. Presents to ER with 1-2 weeks of intermittent nausea, abdominal  pain and poor po intake. No HF symptoms. On exams no evidence of fluid overload. Labs within normal limits except for UTI. CT abdomen with prominent SBO.   Seen by [facility] on 1/25 after she had more ab pain and KUB with worsening SBO. NG tube replaced and started on TPN.  All narcotics are being held.  Renal function improved.   Feels better.  Just wants NG tube out.  No CP/SOB.  No abdominal pain.  All I/Os not measured. Still with several hundred ccs drainage from NGT. Having R ankle pain.   Weight up 2 lbs today.   Going down for xray.   Objective:    Vital Signs:   Temp:  [98 F (36.7 C)-98.5 F (36.9 C)] 98 F (36.7 C) (01/28 0523) Pulse Rate:  [78-89] 78  (01/28 0523) Resp:  [18-20] 18  (01/28 0523) BP: (128-147)/(65-81) 130/65 mmHg (01/28 0523) SpO2:  [100 %] 100 % (01/28 0523) Weight:  [44.18 kg (97 lb 6.4 oz)] 44.18 kg (97 lb 6.4 oz) (01/28 0523) Last BM Date: 05/13/11  24-hour weight change: Weight change: 0.98 kg (2 lb 2.6 oz)  Intake/Output:   Intake/Output Summary (Last 24 hours) at 05/14/11 0801 Last data filed at 05/14/11 0726  Gross per 24 hour  Intake 1741.24 ml  Output   1400 ml  Net 341.24 ml     Physical Exam: General:  Well appearing. No resp difficulty HEENT: normal Neck: supple. JVP 7-8 . Carotids 2+ bilat; no bruits. No lymphadenopathy or thryomegaly appreciated. Cor: PMI nondisplaced. Regular rate & rhythm. No rubs, gallops or murmurs. Lungs: clear Abdomen: soft, nontender, nondistended. No hepatosplenomegaly. No bruits or masses. Bowel sounds present Extremities: no cyanosis, clubbing, rash, edema Neuro: alert & orientedx3, cranial nerves grossly intact. moves all 4 extremities w/o difficulty. Affect pleasant  Telemetry: Sinus Rhythm    Labs: Basic Metabolic Panel:  Lab 05/14/11 1191 05/13/11 0500 05/12/11 0500 05/11/11 0925 05/10/11 0619  NA 138 140 139 143 140  K 3.5 3.9 3.7 3.7 4.4  CL 100 104 104 105 110  CO2 28 27 27 29 21   GLUCOSE 117* 114* 114* 99 92  BUN 20 18 13 8  5*  CREATININE 0.98 1.07 1.31* 1.34* 1.06  CALCIUM 8.4 8.4 8.1* -- --  MG 1.9 1.7 2.2 1.4* --  PHOS 4.8* 3.9 3.1 3.1 --    Liver Function Tests:  Lab 05/14/11 0500 05/12/11 0500  AST 16 18  ALT 11 16  ALKPHOS 66 66  BILITOT 0.2* 0.3  PROT 5.9* 5.8*  ALBUMIN 2.7* 2.8*   No results found for this basename: LIPASE:5,AMYLASE:5 in the last 168 hours No results found for this basename: AMMONIA:3 in the last 168 hours  CBC:  Lab 05/14/11 0500 05/13/11 0500 05/12/11 0500 05/11/11 0925 05/10/11 0619  WBC 6.1 7.3 5.9 8.9 5.6  NEUTROABS 4.5 -- 4.4 -- --  HGB 9.8* 9.8* 9.6* 10.9* 9.5*  HCT 30.2* 30.1* 30.2* 34.0* 29.7*  MCV 86.0 86.5 86.8 86.7 87.1  PLT 222 211 226 224 197    Cardiac Enzymes: No results found for this basename: CKTOTAL:5,CKMB:5,CKMBINDEX:5,TROPONINI:5 in the last 168 hours  BNP: No components found with this basename: POCBNP:5  CBG:  Lab 05/13/11 0548 05/12/11 2353 05/12/11 1608 05/12/11 1300 05/12/11 4782  GLUCAP 124* 132* 123* 131* 132*    Coagulation Studies: No results found for this basename: LABPROT:5,INR:5 in the last 72 hours  Other results: 05/10/11 ECHO-.EF 505-55%  Ventricular septum: Septal motion showed "bounce". The contour showed diastolic flattening and systolic flattening. - Right ventricle: The cavity size was moderately dilated. Systolic function was mildly reduced. - Right atrium: The atrium was mildly dilated. - Tricuspid valve: Severe regurgitation. - Pulmonary arteries: Systolic pressure was moderately increased.    Imaging: Dg Abd 2 Views  05/13/2011  *RADIOLOGY REPORT*  Clinical Data: Small bowel obstruction  ABDOMEN - 2 VIEW  Comparison: 05/12/2011; 05/10/2011; CT of the abdomen  and pelvis - 05/04/2011  Findings:  There is persistent mild gaseous distension of a loop of featureless loop of bowel with the right mid hemiabdomen, measuring approximately 3.9 cm in diameter.  There is likely a minimal amount of distal colonic gas.  Interval advancement of enteric tube with tip now overlying the expected location of the mid body of the stomach.  No definite air-fluid levels seen on the provided decubitus radiograph.  No definite pneumoperitoneum.  Post cholecystectomy.  Vascular calcifications.  Lumbar spine degenerative change. Surgical clips overlying the lower lumbar spine and bilateral groins likely sequela of previous identified aortobifemoral bypass.  IMPRESSION:  1. No definite evidence of obstruction.  2.  Interval advancement of enteric tube with tip now overlying the expected location of the mid body of the stomach.  Original Report Authenticated By: Waynard Reeds, [name]   Dg Abd 2 Views  05/12/2011  *RADIOLOGY REPORT*  Clinical Data: Abdominal pain.  Follow-up small bowel obstruction.  ABDOMEN - 2 VIEW  Comparison: 05/10/2011 and 05/08/2011.  Findings: Nasogastric tube tip is in the distal esophagus and should be advanced.  Small bowel distension appears slightly improved.  There is a residual dilated loop in the left abdomen measuring 4.2 cm in diameter.  Decubitus view demonstrates scattered air-fluid levels and no pneumoperitoneum. Cholecystectomy clips and diverticula at the splenic flexure are noted.  IMPRESSION:  1.  Slight improvement in small bowel obstruction pattern.  No evidence of pneumoperitoneum. 2.  The tip of the nasogastric tube is in the distal esophagus and should be advanced.  Original Report Authenticated By: Gerrianne Scale, [name]     Medications:     Scheduled Medications:    . antiseptic oral rinse  15 mL Mouth Rinse q12n4p  . aspirin EC  81 mg Oral Daily  . digoxin  0.0625 mg Intravenous Daily  . enoxaparin (LOVENOX) injection  30 mg  Subcutaneous Q24H  . furosemide  40 mg Oral Daily  . levothyroxine  50 mcg Intravenous Daily  . magnesium sulfate 1 - 4 g bolus IVPB  2 g Intravenous Once  . pantoprazole (PROTONIX) IV  40 mg Intravenous Q24H  . simvastatin  20 mg Oral q1800  . sodium phosphate  1 enema Rectal Once  . Tadalafil (PAH)  20 mg Oral Daily  . DISCONTD: insulin aspart  0-9 Units Subcutaneous Q6H    Infusions:    . amiodarone (NEXTERONE PREMIX) 360 mg/200 mL dextrose 30 mg/hr (05/14/11 0257)  . milrinone 0.25 mcg/kg/min (05/13/11 1829)  . TPN (CLINIMIX) +/- additives 50 mL/hr at 05/12/11 1734  . TPN (CLINIMIX) +/- additives 50 mL/hr at 05/13/11 1724    PRN Medications: acetaminophen, acetaminophen, phenol, promethazine, sodium chloride, sodium chloride, DISCONTD: HYDROmorphone   Assessment:   1) SBO  2) Severe right heart failure on home milrinone and Adcirca  3) COPD.  4) PAD s/p aortobifem graft  5) CRI, stage III-IV  6) DM2, with symptomatic hypoglycemia   Plan/Discussion:    Still with drainage from NGT. On TPN. Management of SBP per [facility].  Volume status mildly elevated, will give 1 dose of IV lasix and monitor weights.  Cr/BUN has improved, continue to follow.      Length of Stay: 10  Harold Barban 8:01 AM  Patient seen and examined with Ulyess Blossom PA-C. We discussed all aspects of the encounter. I agree with the assessment and plan as stated above.  Will give 1 dose of IV lasix today. Appreciate [facility]'s care.  Jolynne Spurgin,MD 8:50 AM

## 2011-05-15 ENCOUNTER — Inpatient Hospital Stay (HOSPITAL_COMMUNITY): Payer: PRIVATE HEALTH INSURANCE

## 2011-05-15 LAB — CBC
MCH: 28.3 pg (ref 26.0–34.0)
Platelets: 242 10*3/uL (ref 150–400)
RBC: 3.57 MIL/uL — ABNORMAL LOW (ref 3.87–5.11)
RDW: 14.3 % (ref 11.5–15.5)

## 2011-05-15 LAB — BASIC METABOLIC PANEL
CO2: 27 mEq/L (ref 19–32)
Calcium: 8.7 mg/dL (ref 8.4–10.5)
Chloride: 101 mEq/L (ref 96–112)
Creatinine, Ser: 0.92 mg/dL (ref 0.50–1.10)
GFR calc non Af Amer: 59 mL/min — ABNORMAL LOW (ref >90)
Glucose, Bld: 116 mg/dL — ABNORMAL HIGH (ref 70–99)
Potassium: 3.7 mEq/L (ref 3.5–5.1)

## 2011-05-15 MED ORDER — CLINIMIX E/DEXTROSE (5/15) 5 % IV SOLN
INTRAVENOUS | Status: AC
  Administered 2011-05-15: 18:00:00 via INTRAVENOUS
  Filled 2011-05-15: qty 2000

## 2011-05-15 NOTE — Progress Notes (Signed)
PARENTERAL NUTRITION CONSULT NOTE - FOLLOW UP  Pharmacy Consult for TPN Indication: SBO  Allergies  Allergen Reactions  . Iodine Swelling  . Lisinopril Swelling    throat  . Omnipaque (Iohexol) Other (See Comments)    Unknown  . Penicillins Swelling    Patient Measurements: Height: 5\' 4"  (162.6 cm) Weight: 103 lb 1.6 oz (46.766 kg) (bed) IBW/kg (Calculated) : 54.7   Vital Signs: Temp: 98 F (36.7 C) (01/29 0436) Temp src: Oral (01/29 0436) BP: 137/79 mmHg (01/29 0436) Pulse Rate: 75  (01/29 0436) Intake/Output from previous day: 01/28 0701 - 01/29 0700 In: 2348 [I.V.:477.9; NG/GT:240; TPN:1630.2] Out: 1750 [Urine:1700; Emesis/NG output:50] Intake/Output from this shift:    Labs:  Basename 05/15/11 0545 05/14/11 0500 05/13/11 0500  WBC 6.7 6.1 7.3  HGB 10.1* 9.8* 9.8*  HCT 30.8* 30.2* 30.1*  PLT 242 222 211  APTT -- -- --  INR -- -- --     Basename 05/15/11 0545 05/14/11 0500 05/13/11 0500  NA 137 138 140  K 3.7 3.5 3.9  CL 101 100 104  CO2 27 28 27   GLUCOSE 116* 117* 114*  BUN 22 20 18   CREATININE 0.92 0.98 1.07  LABCREA -- -- --  CREAT24HRUR -- -- --  CALCIUM 8.7 8.4 8.4  MG -- 1.9 1.7  PHOS 3.8 4.8* 3.9  PROT -- 5.9* --  ALBUMIN -- 2.7* --  AST -- 16 --  ALT -- 11 --  ALKPHOS -- 66 --  BILITOT -- 0.2* --  BILIDIR -- -- --  IBILI -- -- --  PREALBUMIN -- -- --  TRIG -- -- --  CHOLHDL -- -- --  CHOL -- -- --   Estimated Creatinine Clearance: 38.4 ml/min (by C-G formula based on Cr of 0.92).    Basename 05/13/11 0548 05/12/11 2353 05/12/11 1608  GLUCAP 124* 132* 123*   Insulin Requirements in the past 12 hours:  None. CBGs and SSI d/c'd  Current Nutrition:  NPO; Current TPN provides avg daily 1100 Kcal and 60gm protein (increase today)  Nutritional Goals:  1200-1300 kCal, 55-65 grams of protein per day; 1.5L/d  Assessment: Patient with persistent SBO, for non-operative management. NGT placed 1/25.  GI SBO. recurrent N/V and  distention when diet advanced to solids. Non-surg mngmnt; Wants NG out.+flatus/BM. No problems with clamping NG overnight per RN note.  Endo Glucose 116 on BMET  Lytes/Renal Phos up from 3.9 to 4.8 back to 3.8  Hepatobil LFTs WNL, chol WNL 101, trig slightly elevated 172 with hx HLD. Prealbumin 13.5 slightly below goal 18-40; likely d/t inflammation and low PO intake  Px po PPI, LMWH   Plan:  1. Will continue Clinimix E 5/15 but increase to 31ml/hr (average 1106 kcal/d and 66g protein/d) 2. MWF supplementation of MVI, TE and lipids (58ml/hr) d/t Publishing copy.

## 2011-05-15 NOTE — Progress Notes (Signed)
Subjective: recorded yesterday from NG. Film shows gas in rectum, Small bowel down to 2.8CM, was up to 4.2 cm 05/12/11. +flatus, + liquid stool.  Still wants NG out even if it means going back in later..  Objective: Vital signs in last 24 hours: Temp:  [98 F (36.7 C)-98.2 F (36.8 C)] 98 F (36.7 C) (01/29 0900) Pulse Rate:  [75-80] 75  (01/29 0900) Resp:  [16-19] 18  (01/29 0900) BP: (135-151)/(77-79) 135/77 mmHg (01/29 0900) SpO2:  [99 %-100 %] 100 % (01/29 0900) Weight:  [46.766 kg (103 lb 1.6 oz)] 46.766 kg (103 lb 1.6 oz) (01/29 0436) Last BM Date: 05/13/11  Intake/Output from previous day: 01/28 0701 - 01/29 0700 In: 2348 [I.V.:477.9; NG/GT:240; TPN:1630.2] Out: 1750 [Urine:1700; Emesis/NG output:50] Intake/Output this shift:    PE:  Alert, walked some yesterday still c/o ankle.  Chest: pretty clear Abd: still some mild distension, not tender, +BS.  LE: no edema.  Lab Results:   Basename 05/15/11 0545 05/14/11 0500  WBC 6.7 6.1  HGB 10.1* 9.8*  HCT 30.8* 30.2*  PLT 242 222    Lab 05/14/11 0500 05/12/11 0500  AST 16 18  ALT 11 16  ALKPHOS 66 66  BILITOT 0.2* 0.3  PROT 5.9* 5.8*  ALBUMIN 2.7* 2.8*    BMET  Basename 05/15/11 0545 05/14/11 0500  NA 137 138  K 3.7 3.5  CL 101 100  CO2 27 28  GLUCOSE 116* 117*  BUN 22 20  CREATININE 0.92 0.98  CALCIUM 8.7 8.4   PT/INR No results found for this basename: LABPROT:2,INR:2 in the last 72 hours   Studies/Results: Dg Ankle 2 Views Left  05/14/2011  *RADIOLOGY REPORT*  Clinical Data: Left ankle pain.  LEFT ANKLE - 2 VIEW  Comparison: None  Findings: There is no evidence of fracture, subluxation or dislocation. Mild degenerative changes along the medial tibiotalar joint noted. No focal bony lesions are present. There is no evidence of ankle effusion.  IMPRESSION: No evidence of acute abnormality.  Mild tibiotalar joint degenerative changes.  Original Report Authenticated By: Rosendo Gros, [name]   Dg Abd  2 Views  05/15/2011  *RADIOLOGY REPORT*  Clinical Data: Small bowel obstruction  ABDOMEN - 2 VIEW  Comparison: Plain films 05/13/2011  Findings: NG tube is unchanged.  Dilated loops of small bowel measure up to 2.8 cm which is improved compared to prior.  No intraperitoneal free air is evident on the left lateral decubitus view.  There is gas within the rectum.  IMPRESSION: Improving small bowel obstruction pattern.  Original Report Authenticated By: Genevive Bi, [name]    Anti-infectives: Anti-infectives    None     Current Facility-Administered Medications  Medication Dose Route Frequency Provider Last Rate Last Dose  . acetaminophen (TYLENOL) tablet 650 mg  650 mg Oral Q6H PRN Ron Parker, MD       Or  . acetaminophen (TYLENOL) suppository 650 mg  650 mg Rectal Q6H PRN Ron Parker, MD      . amiodarone (NEXTERONE PREMIX) 360 mg/200 mL dextrose IV infusion  30 mg/hr Intravenous Continuous Ok Anis, NP 16.7 mL/hr at 05/15/11 0220 30 mg/hr at 05/15/11 0220  . antiseptic oral rinse (BIOTENE) solution 15 mL  15 mL Mouth Rinse q12n4p Vassie Loll, MD   15 mL at 05/14/11 1200  . aspirin EC tablet 81 mg  81 mg Oral Daily Ron Parker, MD   81 mg at 05/14/11 4098  . digoxin (LANOXIN)  tablet 0.0625 mg  0.0625 mg Oral Daily Crystal Stillinger [place], PHARMD   0.0625 mg at 05/14/11 1057  . enoxaparin (LOVENOX) injection 30 mg  30 mg Subcutaneous Q24H Marylouise Stacks, PHARMD   30 mg at 05/14/11 1057  . tpn solution (CLINIMIX E 5/15) 2,000 mL with multivitamins adult 10 mL, trace elements Cr-Cu-Mn-Se-Zn 1 mL infusion   Intravenous Continuous TPN Crystal Stillinger Robertson, PHARMD 50 mL/hr at 05/14/11 1817     And  . fat emulsion 20 % infusion 250 mL  250 mL Intravenous Continuous TPN Crystal [place], PHARMD 10 mL/hr at 05/14/11 1818 250 mL at 05/14/11 1818  . furosemide (LASIX) tablet 40 mg  40 mg Oral Daily Crystal [place], PHARMD        . levothyroxine (SYNTHROID, LEVOTHROID) tablet 100 mcg  100 mcg Oral QAC breakfast Crystal Stillinger [place], PHARMD   100 mcg at 05/15/11 0630  . milrinone [facility]) infusion 200 mcg/mL (0.2 mg/mL)  0.25 mcg/kg/min Intravenous Continuous Dione Booze, MD 3.1 mL/hr at 05/14/11 2139 0.25 mcg/kg/min at 05/14/11 2139  . pantoprazole (PROTONIX) EC tablet 40 mg  40 mg Oral Q1200 Crystal Stillinger [place], PHARMD   40 mg at 05/14/11 1101  . phenol (CHLORASEPTIC) mouth spray 1 spray  1 spray Mouth/Throat PRN Vassie Loll, MD   1 spray at 05/09/11 [phone]  . potassium chloride (K-DUR,KLOR-CON) CR tablet 30 mEq  30 mEq Oral TID Ernestene Mention, MD   30 mEq at 05/14/11 2139  . promethazine (PHENERGAN) injection 12.5 mg  12.5 mg Intravenous Q6H PRN Maren Reamer, NP   12.5 mg at 05/11/11 1426  . simvastatin (ZOCOR) tablet 20 mg  20 mg Oral q1800 Ron Parker, MD   20 mg at 05/14/11 1657  . sodium chloride 0.9 % injection 10-40 mL  10-40 mL Intracatheter PRN Vassie Loll, MD   10 mL at 05/06/11 2119  . sodium chloride 0.9 % injection 10-40 mL  10-40 mL Intracatheter PRN Vassie Loll, MD   10 mL at 05/15/11 0931  . Tadalafil (PAH) TABS 20 mg  20 mg Oral Daily Ron Parker, MD   20 mg at 05/14/11 1000  . tpn solution (CLINIMIX E 5/15) 2,000 mL infusion   Intravenous Continuous TPN Christella Hartigan, PHARMD 50 mL/hr at 05/13/11 1724    . tpn solution (CLINIMIX E 5/15) 2,000 mL infusion   Intravenous Continuous TPN Crystal Salomon Fick, PHARMD        Assessment/Plan SBO, s/pAoBiFem,/Hx Cervical ca. Flatus, +BM with enema.  CAD/CM/CHF on Milrinone at home./Afib/Hx. PEA  PVOD  COPD  Hypothyroid  Gerd  Hypertension  Renal Insuffiency   Plan:  D/C NG, few ice chips, go slow with no further advancement today.  Continue TNA.    LOS: 11 days    Thanh Pomerleau 05/15/2011

## 2011-05-15 NOTE — Progress Notes (Signed)
Patient seen and examined. Had stool and flatus. Xrays improved with air in colon, decreased small bowel distention.   Care plan discussed with family.  Abdomen soft and non-tender, slightly distended and slightly tympanitic.  Hopefully the SBO is resolving. Agree with going slow with diet. Would strive for K+ level around 4.0.   Angelia Mould. Derrell Lolling, [name], [facility] General and Minimally invasive Surgery Breast and Colorectal Surgery Office:   [phone] Pager:   [phone]

## 2011-05-15 NOTE — Progress Notes (Signed)
Advanced Heart Failure Rounding Note   Subjective:    Navreet is a 78 y/o woman with COPD and severe right heart failure on home milrinone. Presents to ER with 1-2 weeks of intermittent nausea, abdominal  pain and poor po intake. No HF symptoms. On exams no evidence of fluid overload. Labs within normal limits except for UTI. CT abdomen with prominent SBO.   Seen by [facility] on 1/25 after she had more ab pain and KUB with worsening SBO. NG tube replaced and started on TPN.  All narcotics are being held.  Renal function improved.   Ready to have NG tube removed, o/w no complaints of dyspnea/orthopnea/CP.  NG tube clamped overnight, repeat abd xray today.   Weight up today but bed scale was used and not all items removed from bed prior to weight, per pt.    Objective:    Vital Signs:   Temp:  [98 F (36.7 C)-98.2 F (36.8 C)] 98 F (36.7 C) (01/29 0436) Pulse Rate:  [75-80] 75  (01/29 0436) Resp:  [16-19] 19  (01/29 0436) BP: (135-151)/(77-79) 137/79 mmHg (01/29 0436) SpO2:  [99 %-100 %] 100 % (01/29 0436) Weight:  [46.766 kg (103 lb 1.6 oz)] 46.766 kg (103 lb 1.6 oz) (01/29 0436) Last BM Date: 05/13/11  24-hour weight change: Weight change: 2.586 kg (5 lb 11.2 oz)  Intake/Output:   Intake/Output Summary (Last 24 hours) at 05/15/11 0754 Last data filed at 05/15/11 0700  Gross per 24 hour  Intake 2348.02 ml  Output   1700 ml  Net 648.02 ml     Physical Exam: General:  Well appearing. No resp difficulty HEENT: normal Neck: supple. JVP flat. Carotids 2+ bilat; no bruits. No lymphadenopathy or thryomegaly appreciated. Cor: PMI nondisplaced. Regular rate & rhythm. No rubs, gallops or murmurs. Lungs: clear Abdomen: soft, nontender, nondistended. No hepatosplenomegaly. No bruits or masses. Bowel sounds present Extremities: no cyanosis, clubbing, rash, edema Neuro: alert & orientedx3, cranial nerves grossly intact. moves all 4 extremities w/o difficulty. Affect pleasant  Telemetry:  Sinus Rhythm   Labs: Basic Metabolic Panel:  Lab 05/15/11 4696 05/14/11 0500 05/13/11 0500 05/12/11 0500 05/11/11 0925  NA 137 138 140 139 143  K 3.7 3.5 3.9 3.7 3.7  CL 101 100 104 104 105  CO2 27 28 27 27 29   GLUCOSE 116* 117* 114* 114* 99  BUN 22 20 18 13 8   CREATININE 0.92 0.98 1.07 1.31* 1.34*  CALCIUM 8.7 8.4 8.4 -- --  MG -- 1.9 1.7 2.2 1.4*  PHOS 3.8 4.8* 3.9 3.1 3.1    Liver Function Tests:  Lab 05/14/11 0500 05/12/11 0500  AST 16 18  ALT 11 16  ALKPHOS 66 66  BILITOT 0.2* 0.3  PROT 5.9* 5.8*  ALBUMIN 2.7* 2.8*   No results found for this basename: LIPASE:5,AMYLASE:5 in the last 168 hours No results found for this basename: AMMONIA:3 in the last 168 hours  CBC:  Lab 05/15/11 0545 05/14/11 0500 05/13/11 0500 05/12/11 0500 05/11/11 0925  WBC 6.7 6.1 7.3 5.9 8.9  NEUTROABS -- 4.5 -- 4.4 --  HGB 10.1* 9.8* 9.8* 9.6* 10.9*  HCT 30.8* 30.2* 30.1* 30.2* 34.0*  MCV 86.3 86.0 86.5 86.8 86.7  PLT 242 222 211 226 224    Cardiac Enzymes: No results found for this basename: CKTOTAL:5,CKMB:5,CKMBINDEX:5,TROPONINI:5 in the last 168 hours  BNP: No components found with this basename: POCBNP:5  CBG:  Lab 05/13/11 0548 05/12/11 2353 05/12/11 1608 05/12/11 1300 05/12/11 0624  GLUCAP  124* 132* 123* 131* 132*    Coagulation Studies: No results found for this basename: LABPROT:5,INR:5 in the last 72 hours  Other results: 05/10/11 ECHO-.EF 505-55%  Ventricular septum: Septal motion showed "bounce". The contour showed diastolic flattening and systolic flattening. - Right ventricle: The cavity size was moderately dilated. Systolic function was mildly reduced. - Right atrium: The atrium was mildly dilated. - Tricuspid valve: Severe regurgitation. - Pulmonary arteries: Systolic pressure was moderately increased.    Imaging: Dg Ankle 2 Views Left  05/14/2011  *RADIOLOGY REPORT*  Clinical Data: Left ankle pain.  LEFT ANKLE - 2 VIEW  Comparison: None  Findings: There is  no evidence of fracture, subluxation or dislocation. Mild degenerative changes along the medial tibiotalar joint noted. No focal bony lesions are present. There is no evidence of ankle effusion.  IMPRESSION: No evidence of acute abnormality.  Mild tibiotalar joint degenerative changes.  Original Report Authenticated By: Rosendo Gros, [name]     Medications:     Scheduled Medications:    . antiseptic oral rinse  15 mL Mouth Rinse q12n4p  . aspirin EC  81 mg Oral Daily  . digoxin  0.0625 mg Oral Daily  . enoxaparin (LOVENOX) injection  30 mg Subcutaneous Q24H  . furosemide  40 mg Oral Daily  . levothyroxine  100 mcg Oral QAC breakfast  . pantoprazole  40 mg Oral Q1200  . potassium chloride  30 mEq Oral TID  . simvastatin  20 mg Oral q1800  . Tadalafil (PAH)  20 mg Oral Daily  . DISCONTD: digoxin  0.0625 mg Intravenous Daily  . DISCONTD: furosemide  40 mg Oral Daily  . DISCONTD: levothyroxine  50 mcg Intravenous Daily  . DISCONTD: pantoprazole (PROTONIX) IV  40 mg Intravenous Q24H    Infusions:    . amiodarone (NEXTERONE PREMIX) 360 mg/200 mL dextrose 30 mg/hr (05/15/11 0220)  . TPN (CLINIMIX) +/- additives 50 mL/hr at 05/14/11 1817   And  . fat emulsion 250 mL (05/14/11 1818)  . milrinone 0.25 mcg/kg/min (05/14/11 2139)  . TPN (CLINIMIX) +/- additives 50 mL/hr at 05/13/11 1724    PRN Medications: acetaminophen, acetaminophen, phenol, promethazine, sodium chloride, sodium chloride   Assessment:   1) SBO  2) Severe right heart failure on home milrinone and Adcirca  3) COPD.  4) PAD s/p aortobifem graft  5) CRI, stage III-IV  6) DM2, with symptomatic hypoglycemia   Plan/Discussion:     Volume status looks good today.  Will continue current meds.  NG tube clamped at this time awaiting abd xray results.  Management of SBO per [facility].  Will follow.    Length of Stay: 11  Harold Barban 7:54 AM   Patient seen and examined with Ulyess Blossom PA-C. We discussed all  aspects of the encounter. I agree with the assessment and plan as stated above. KUB reviewed. SBO seems to be improving. Management per [facility]. (appreciate their care). RHF is stable.   Sheleen Conchas,MD 9:44 AM

## 2011-05-15 NOTE — Progress Notes (Signed)
Subjective:  Patient seen and examined, denies abdominal pain or any other complaints, she is very eager to have NG tube out.  Objective: Vital signs in last 24 hours: Temp:  [98 F (36.7 C)-98.2 F (36.8 C)] 98 F (36.7 C) (01/29 0900) Pulse Rate:  [75-80] 75  (01/29 0900) Resp:  [16-19] 18  (01/29 0900) BP: (135-151)/(77-79) 135/77 mmHg (01/29 0900) SpO2:  [99 %-100 %] 100 % (01/29 0900) Weight:  [46.766 kg (103 lb 1.6 oz)] 46.766 kg (103 lb 1.6 oz) (01/29 0436) Weight change: 2.586 kg (5 lb 11.2 oz) Last BM Date: 05/13/11  Intake/Output from previous day: 01/28 0701 - 01/29 0700 In: 2348 [I.V.:477.9; NG/GT:240; TPN:1630.2] Out: 1750 [Urine:1700; Emesis/NG output:50]     Physical Exam: General: Alert, awake, oriented x3, in no acute distress.  NGT in place  Heart: Regular rate and rhythm, without murmurs, rubs, gallops.  Lungs: Clear to auscultation bilaterally.  Abdomen: Soft, nontender, nondistended, dminished bowel sounds.  Extremities: No clubbing cyanosis or edema with positive pedal pulses.left anke with no swelling ,no tenderness or erythema ,FROM.  Neuro: Grossly intact, nonfocal     Lab Results: Results for orders placed during the hospital encounter of 05/04/11 (from the past 24 hour(s))  PHOSPHORUS     Status: Normal   Collection Time   05/15/11  5:45 AM      Component Value Range   Phosphorus 3.8  2.3 - 4.6 (mg/dL)  BASIC METABOLIC PANEL     Status: Abnormal   Collection Time   05/15/11  5:45 AM      Component Value Range   Sodium 137  135 - 145 (mEq/L)   Potassium 3.7  3.5 - 5.1 (mEq/L)   Chloride 101  96 - 112 (mEq/L)   CO2 27  19 - 32 (mEq/L)   Glucose, Bld 116 (*) 70 - 99 (mg/dL)   BUN 22  6 - 23 (mg/dL)   Creatinine, Ser 8.41  0.50 - 1.10 (mg/dL)   Calcium 8.7  8.4 - 32.4 (mg/dL)   GFR calc non Af Amer 59 (*) >90 (mL/min)   GFR calc Af Amer 68 (*) >90 (mL/min)  CBC     Status: Abnormal   Collection Time   05/15/11  5:45 AM      Component  Value Range   WBC 6.7  4.0 - 10.5 (K/uL)   RBC 3.57 (*) 3.87 - 5.11 (MIL/uL)   Hemoglobin 10.1 (*) 12.0 - 15.0 (g/dL)   HCT 40.1 (*) 02.7 - 46.0 (%)   MCV 86.3  78.0 - 100.0 (fL)   MCH 28.3  26.0 - 34.0 (pg)   MCHC 32.8  30.0 - 36.0 (g/dL)   RDW 25.3  66.4 - 40.3 (%)   Platelets 242  150 - 400 (K/uL)    Studies/Results: Dg Ankle 2 Views Left  05/14/2011  *RADIOLOGY REPORT*  Clinical Data: Left ankle pain.  LEFT ANKLE - 2 VIEW  Comparison: None  Findings: There is no evidence of fracture, subluxation or dislocation. Mild degenerative changes along the medial tibiotalar joint noted. No focal bony lesions are present. There is no evidence of ankle effusion.  IMPRESSION: No evidence of acute abnormality.  Mild tibiotalar joint degenerative changes.  Original Report Authenticated By: Rosendo Gros, [name]   Dg Abd 2 Views  05/15/2011  *RADIOLOGY REPORT*  Clinical Data: Small bowel obstruction  ABDOMEN - 2 VIEW  Comparison: Plain films 05/13/2011  Findings: NG tube is unchanged.  Dilated loops of small  bowel measure up to 2.8 cm which is improved compared to prior.  No intraperitoneal free air is evident on the left lateral decubitus view.  There is gas within the rectum.  IMPRESSION: Improving small bowel obstruction pattern.  Original Report Authenticated By: Genevive Bi, [name]    Medications:    . antiseptic oral rinse  15 mL Mouth Rinse q12n4p  . aspirin EC  81 mg Oral Daily  . digoxin  0.0625 mg Oral Daily  . enoxaparin (LOVENOX) injection  30 mg Subcutaneous Q24H  . furosemide  40 mg Oral Daily  . levothyroxine  100 mcg Oral QAC breakfast  . pantoprazole  40 mg Oral Q1200  . potassium chloride  30 mEq Oral TID  . simvastatin  20 mg Oral q1800  . Tadalafil (PAH)  20 mg Oral Daily    acetaminophen, acetaminophen, phenol, promethazine, sodium chloride, sodium chloride     . amiodarone (NEXTERONE PREMIX) 360 mg/200 mL dextrose 30 mg/hr (05/15/11 0220)  . TPN (CLINIMIX) +/-  additives 50 mL/hr at 05/14/11 1817   And  . fat emulsion 250 mL (05/14/11 1818)  . milrinone 0.25 mcg/kg/min (05/14/11 2139)  . TPN (CLINIMIX) +/- additives 50 mL/hr at 05/13/11 1724  . TPN (CLINIMIX) +/- additives      Assessment/Plan:  1-SBO: high grade most likely due to adhesions. Improved, plans as per surgery to discontinue NGT today and start ice chips she is also on TPN. 2-Pulmonary hypertension and RHF (right heart failure): continue current management. Well compensated. 3-Hypokalemia:resolved,replete potassium as needed.  4-Left hydronephrosis: Chronic, appears to be 2/2 to femoral bypass into left leg.urology evaluation/is followup when more stable  as an outpatient . Patient with CKD III, baseline creatinine 0.9 - 1.4, currently stable and with adequate urine output 5-DVT: SCD's.  6.Difficulty of extension of Left ankle : Possibly secondary to arthritis X ray left ankle showed no fracture.she was seen by PT , no followup recommended.  7. Severe malnutrition: Continue TPN, advance diet as per surgical recommendation. Nutrition followup      LOS: 11 days   Kelly Hart 05/15/2011, 11:37 AM

## 2011-05-16 DIAGNOSIS — D649 Anemia, unspecified: Secondary | ICD-10-CM | POA: Diagnosis present

## 2011-05-16 DIAGNOSIS — E039 Hypothyroidism, unspecified: Secondary | ICD-10-CM | POA: Diagnosis present

## 2011-05-16 DIAGNOSIS — K56609 Unspecified intestinal obstruction, unspecified as to partial versus complete obstruction: Secondary | ICD-10-CM | POA: Diagnosis present

## 2011-05-16 LAB — BASIC METABOLIC PANEL
BUN: 25 mg/dL — ABNORMAL HIGH (ref 6–23)
Chloride: 101 mEq/L (ref 96–112)
GFR calc Af Amer: 58 mL/min — ABNORMAL LOW (ref >90)
GFR calc non Af Amer: 50 mL/min — ABNORMAL LOW (ref >90)
Potassium: 3.8 mEq/L (ref 3.5–5.1)
Sodium: 139 mEq/L (ref 135–145)

## 2011-05-16 LAB — CBC
HCT: 31.5 % — ABNORMAL LOW (ref 36.0–46.0)
MCHC: 32.4 g/dL (ref 30.0–36.0)
Platelets: 261 10*3/uL (ref 150–400)
RDW: 14.4 % (ref 11.5–15.5)
WBC: 4.9 10*3/uL (ref 4.0–10.5)

## 2011-05-16 MED ORDER — TRACE MINERALS CR-CU-MN-SE-ZN 10-1000-500-60 MCG/ML IV SOLN
INTRAVENOUS | Status: DC
  Administered 2011-05-16: 19:00:00 via INTRAVENOUS
  Filled 2011-05-16: qty 2000

## 2011-05-16 MED ORDER — FAT EMULSION 20 % IV EMUL
250.0000 mL | INTRAVENOUS | Status: DC
  Administered 2011-05-16: 250 mL via INTRAVENOUS
  Filled 2011-05-16: qty 250

## 2011-05-16 NOTE — Progress Notes (Signed)
Patient discussed at the Long Length of Stay Kelly Hart Weeks 05/16/2011  

## 2011-05-16 NOTE — Progress Notes (Signed)
Physical Therapy Treatment Patient Details Name: Kelly Hart MRN: [id number] DOB: Sep 14, 1932 Today's Date: 05/16/2011  PT Assessment/Plan  PT - Assessment/Plan Comments on Treatment Session: pt was safe with mobility.  She could be independent a home environmen,t except for lines/tubes.  SaO2 on 4L remained>92% PT Plan: Discharge plan remains appropriate Follow Up Recommendations: No PT follow up Equipment Recommended: None recommended by PT PT Goals  Acute Rehab PT Goals PT Goal: Sit to Stand - Progress: Met PT Goal: Stand to Sit - Progress: Met PT Goal: Ambulate - Progress: Progressing toward goal PT Goal: Up/Down Stairs - Progress: Other (comment) (not addressed today) PT Goal: Perform Home Exercise Program - Progress: Progressing toward goal  PT Treatment Precautions/Restrictions  Precautions Required Braces or Orthoses: No Restrictions Weight Bearing Restrictions: No Other Position/Activity Restrictions: watch O2 sats, ambulate with O2 Mobility (including Balance) Bed Mobility Sit to Supine: 6: Modified independent (Device/Increase time) Transfers Sit to Stand: 6: Modified independent (Device/Increase time) Stand to Sit: 6: Modified independent (Device/Increase time) Ambulation/Gait Ambulation/Gait: Yes Ambulation/Gait Assistance: 5: Supervision;Other (comment) (due to lines/portable O2) Ambulation/Gait Assistance Details (indicate cue type and reason): can change cadence readily, no problems while scanning her environment and  Ambulation Distance (Feet): 360 Feet Assistive device: None Gait Pattern: Within Functional Limits Stairs: No  Balance Balance Assessed:  (generally steady on her feet) Dynamic Sitting Balance Dynamic Sitting - Balance Support: No upper extremity supported;During functional activity Dynamic Sitting - Level of Assistance: 6: Modified independent (Device/Increase time) Exercise  General Exercises - Lower Extremity Hip ABduction/ADduction:  AROM;Strengthening;10 reps;Standing Hip Flexion/Marching: AROM;Strengthening;Both;10 reps;Standing Mini-Sqauts: AROM;Strengthening;10 reps;Standing End of Session PT - End of Session Activity Tolerance: Patient tolerated treatment well Patient left: in bed;with call bell in reach;with family/visitor present General Behavior During Session: [facility] for tasks performed Cognition: [facility] for tasks performed  Beyla Loney, Eliseo Gum 05/16/2011, 1:00 PM  05/16/2011  [place] Bing, PT [phone] (pager)

## 2011-05-16 NOTE — Progress Notes (Addendum)
Advanced Heart Failure Rounding Note   Subjective:    Kelly Hart is a 78 y/o woman with COPD and severe right heart failure on home milrinone. Presents to ER with 1-2 weeks of intermittent nausea, abdominal  pain and poor po intake. No HF symptoms. On exams no evidence of fluid overload. Labs within normal limits except for UTI. CT abdomen with prominent SBO.   Seen by [facility] on 1/25 after she had more ab pain and KUB with worsening SBO. NG tube replaced and started on TPN.  All narcotics are being held.  Renal function improved.   Feels better.  NG tube pulled yesterday.  Multiple BMs.  No N/V.  On ice chips.  No CP/SOB.  No abdominal pain.    Objective:    Vital Signs:   Temp:  [98 F (36.7 C)-99.1 F (37.3 C)] 98.1 F (36.7 C) (01/30 0629) Pulse Rate:  [74-85] 74  (01/30 0629) Resp:  [18-20] 18  (01/30 0629) BP: (132-146)/(66-79) 132/76 mmHg (01/30 0629) SpO2:  [93 %-100 %] 100 % (01/30 0629) Weight:  [43.228 kg (95 lb 4.8 oz)] 43.228 kg (95 lb 4.8 oz) (01/30 0629) Last BM Date: 05/15/11  24-hour weight change: Weight change: -3.538 kg (-7 lb 12.8 oz)  Intake/Output:   Intake/Output Summary (Last 24 hours) at 05/16/11 0801 Last data filed at 05/16/11 0700  Gross per 24 hour  Intake 1987.17 ml  Output   1800 ml  Net 187.17 ml     Physical Exam: General:  Well appearing. No resp difficulty HEENT: normal Neck: supple. JVP 7-8 . Carotids 2+ bilat; no bruits. No lymphadenopathy or thryomegaly appreciated. Cor: PMI nondisplaced. Regular rate & rhythm. No rubs, gallops or murmurs. Lungs: clear Abdomen: soft, nontender, nondistended. No hepatosplenomegaly. No bruits or masses. Bowel sounds present Extremities: no cyanosis, clubbing, rash, edema Neuro: alert & orientedx3, cranial nerves grossly intact. moves all 4 extremities w/o difficulty. Affect pleasant  Telemetry: Sinus Rhythm   Labs: Basic Metabolic Panel:  Lab 05/16/11 0981 05/15/11 0545 05/14/11 0500 05/13/11 0500  05/12/11 0500 05/11/11 0925  NA 139 137 138 140 139 --  K 3.8 3.7 3.5 3.9 3.7 --  CL 101 101 100 104 104 --  CO2 26 27 28 27 27  --  GLUCOSE 113* 116* 117* 114* 114* --  BUN 25* 22 20 18 13  --  CREATININE 1.06 0.92 0.98 1.07 1.31* --  CALCIUM 8.7 8.7 8.4 -- -- --  MG -- -- 1.9 1.7 2.2 1.4*  PHOS -- 3.8 4.8* 3.9 3.1 3.1    Liver Function Tests:  Lab 05/14/11 0500 05/12/11 0500  AST 16 18  ALT 11 16  ALKPHOS 66 66  BILITOT 0.2* 0.3  PROT 5.9* 5.8*  ALBUMIN 2.7* 2.8*   No results found for this basename: LIPASE:5,AMYLASE:5 in the last 168 hours No results found for this basename: AMMONIA:3 in the last 168 hours  CBC:  Lab 05/16/11 0600 05/15/11 0545 05/14/11 0500 05/13/11 0500 05/12/11 0500  WBC 4.9 6.7 6.1 7.3 5.9  NEUTROABS -- -- 4.5 -- 4.4  HGB 10.2* 10.1* 9.8* 9.8* 9.6*  HCT 31.5* 30.8* 30.2* 30.1* 30.2*  MCV 84.2 86.3 86.0 86.5 86.8  PLT 261 242 222 211 226    Cardiac Enzymes: No results found for this basename: CKTOTAL:5,CKMB:5,CKMBINDEX:5,TROPONINI:5 in the last 168 hours  BNP: No components found with this basename: POCBNP:5  CBG:  Lab 05/13/11 0548 05/12/11 2353 05/12/11 1608 05/12/11 1300 05/12/11 0624  GLUCAP 124* 132* 123* 131* 132*  Coagulation Studies: No results found for this basename: LABPROT:5,INR:5 in the last 72 hours  Other results: 05/10/11 ECHO-.EF 505-55%  Ventricular septum: Septal motion showed "bounce". The contour showed diastolic flattening and systolic flattening. - Right ventricle: The cavity size was moderately dilated. Systolic function was mildly reduced. - Right atrium: The atrium was mildly dilated. - Tricuspid valve: Severe regurgitation. - Pulmonary arteries: Systolic pressure was moderately increased.    Imaging: Dg Ankle 2 Views Left  05/14/2011  *RADIOLOGY REPORT*  Clinical Data: Left ankle pain.  LEFT ANKLE - 2 VIEW  Comparison: None  Findings: There is no evidence of fracture, subluxation or dislocation. Mild  degenerative changes along the medial tibiotalar joint noted. No focal bony lesions are present. There is no evidence of ankle effusion.  IMPRESSION: No evidence of acute abnormality.  Mild tibiotalar joint degenerative changes.  Original Report Authenticated By: Rosendo Gros, [name]   Dg Abd 2 Views  05/15/2011  *RADIOLOGY REPORT*  Clinical Data: Small bowel obstruction  ABDOMEN - 2 VIEW  Comparison: Plain films 05/13/2011  Findings: NG tube is unchanged.  Dilated loops of small bowel measure up to 2.8 cm which is improved compared to prior.  No intraperitoneal free air is evident on the left lateral decubitus view.  There is gas within the rectum.  IMPRESSION: Improving small bowel obstruction pattern.  Original Report Authenticated By: Genevive Bi, [name]     Medications:     Scheduled Medications:    . antiseptic oral rinse  15 mL Mouth Rinse q12n4p  . aspirin EC  81 mg Oral Daily  . digoxin  0.0625 mg Oral Daily  . enoxaparin (LOVENOX) injection  30 mg Subcutaneous Q24H  . furosemide  40 mg Oral Daily  . levothyroxine  100 mcg Oral QAC breakfast  . pantoprazole  40 mg Oral Q1200  . potassium chloride  30 mEq Oral TID  . simvastatin  20 mg Oral q1800  . Tadalafil (PAH)  20 mg Oral Daily    Infusions:    . amiodarone (NEXTERONE PREMIX) 360 mg/200 mL dextrose 30 mg/hr (05/16/11 0127)  . TPN (CLINIMIX) +/- additives 50 mL/hr at 05/14/11 1817   And  . fat emulsion 250 mL (05/14/11 1818)  . TPN (CLINIMIX) +/- additives     And  . fat emulsion    . milrinone 0.25 mcg/kg/min (05/16/11 0129)  . TPN (CLINIMIX) +/- additives 55 mL/hr at 05/15/11 1803    PRN Medications: acetaminophen, acetaminophen, phenol, promethazine, sodium chloride, sodium chloride   Assessment:   1) SBO  2) Severe right heart failure on home milrinone and Adcirca  3) COPD.  4) PAD s/p aortobifem graft  5) CRI, stage III-IV  6) DM2, with symptomatic hypoglycemia   Plan/Discussion:    NG tube out.   Doing well, may be able to move to clear liquids today.  Management of SBP per [facility].  Volume status looks good.  Will hopefully transition back to oral medications today if ok by [facility].  Continue to follow daily weights and renal function.  Continue milrinone.      Length of Stay: 12  Harold Barban 8:01 AM  Patient seen and examined with Ulyess Blossom PA-C. We discussed all aspects of the encounter. I agree with the assessment and plan as stated above. Doing great. Ambulating halls. Belly feeling better.   Daniel Bensimhon,MD 2:01 PM

## 2011-05-16 NOTE — Progress Notes (Signed)
Cosign for Emily Strickland RN assessment, med admin, care plan/education, I/O, and notes. 

## 2011-05-16 NOTE — Progress Notes (Signed)
  Subjective: Passing flatus. No abdominal pain.  No nausea after NGT removed  Objective: Vital signs in last 24 hours: Temp:  [97.8 F (36.6 C)-99.1 F (37.3 C)] 97.8 F (36.6 C) (01/30 0835) Pulse Rate:  [74-85] 77  (01/30 0835) Resp:  [18-20] 18  (01/30 0835) BP: (132-146)/(66-79) 134/67 mmHg (01/30 0835) SpO2:  [93 %-100 %] 95 % (01/30 0835) Weight:  [95 lb 4.8 oz (43.228 kg)] 95 lb 4.8 oz (43.228 kg) (01/30 0629) Last BM Date: 06-03-11  Intake/Output from previous day: Jun 02, 2022 0701 - 01/30 0700 In: 2067 [P.O.:60; I.V.:638.2; ZOX:0960.4] Out: 1800 [Urine:1800] Intake/Output this shift: Total I/O In: 77.7 [I.V.:18.1; TPN:59.6] Out: 0   General appearance: alert, cooperative, appears stated age and no distress GI: soft, non-tender; bowel sounds normal; no masses,  no organomegaly  Mildly distended abd, but soft and non-tender as noted. Umbilical hernia noted. Lab Results:   Basename 05/16/11 0600 03-Jun-2011 0545  WBC 4.9 6.7  HGB 10.2* 10.1*  HCT 31.5* 30.8*  PLT 261 242   BMET  Basename 05/16/11 0600 06/03/11 0545  NA 139 137  K 3.8 3.7  CL 101 101  CO2 26 27  GLUCOSE 113* 116*  BUN 25* 22  CREATININE 1.06 0.92  CALCIUM 8.7 8.7   PT/INR No results found for this basename: LABPROT:2,INR:2 in the last 72 hours ABG No results found for this basename: PHART:2,PCO2:2,PO2:2,HCO3:2 in the last 72 hours  Studies/Results: Dg Abd 2 Views  June 03, 2011  *RADIOLOGY REPORT*  Clinical Data: Small bowel obstruction  ABDOMEN - 2 VIEW  Comparison: Plain films 05/13/2011  Findings: NG tube is unchanged.  Dilated loops of small bowel measure up to 2.8 cm which is improved compared to prior.  No intraperitoneal free air is evident on the left lateral decubitus view.  There is gas within the rectum.  IMPRESSION: Improving small bowel obstruction pattern.  Original Report Authenticated By: Genevive Bi, [name]    Anti-infectives: Anti-infectives    None       Assessment/Plan: SBO- improving, will start some clear liquids and monitor tolerance to po's Advance diet to clear liquids  LOS: 12 days   Monifa Blanchette,PA-C Pager [phone] General Trauma Pager [phone]

## 2011-05-16 NOTE — Progress Notes (Signed)
PCP: HASANAJ,XAJE A, MD, MD  Brief HPI:  Kelly Hart is an 78 y.o. female who presented with complaints of nausea and vomiting and generalized ABD Pain X 1 week. She denied hematemesis, and reported not being able to hold down foods or liquids. She reported passing loose stools. She denied having fevers or chills. In the ED a CT scan of the ABD and pelvis revealed a Small Bowel Obstruction and patient was referred for admission.   Past medical history:  Past Medical History   Diagnosis  Date   .  Hypertension    .  Renal insufficiency      baseline around cr 1.3   .  Atrial fibrillation    .  COPD (chronic obstructive pulmonary disease)    .  CHF (congestive heart failure)      diastolic   .  PEA (Pulseless electrical activity)  Oct 2012   .  PAD (peripheral artery disease)    .  Hyperlipidemia    .  Hypothyroidism    .  History of cervical cancer    .  Chronic diarrhea      secondary to treatment of her cervical cancer   .  Hydronephrosis, left    .  Pulmonary hypertension    .  GERD (gastroesophageal reflux disease)    .  Myocardial infarction      Consultants: Heart Failure Team, CCS  Procedures: None so far  Subjective: Patient feels fine. No nausea or vomiting. Had loose stools last night. No abdominal pain. Denies CP or SOB.  Objective: Vital signs in last 24 hours: Temp:  [97.8 F (36.6 C)-99.1 F (37.3 C)] 97.8 F (36.6 C) (01/30 0835) Pulse Rate:  [74-85] 77  (01/30 0835) Resp:  [18-20] 18  (01/30 0835) BP: (132-146)/(66-79) 134/67 mmHg (01/30 0835) SpO2:  [93 %-100 %] 95 % (01/30 0835) Weight:  [43.228 kg (95 lb 4.8 oz)] 43.228 kg (95 lb 4.8 oz) (01/30 0629) Weight change: -3.538 kg (-7 lb 12.8 oz) Last BM Date: 2011-06-02  Intake/Output from previous day: 06/01/22 0701 - 01/30 0700 In: 2067 [P.O.:60; I.V.:638.2; TPN:1368.8] Out: 1800 [Urine:1800] Intake/Output this shift: Total I/O In: 77.7 [I.V.:18.1; TPN:59.6] Out: -   General appearance:  alert, cooperative, appears stated age and no distress Head: Normocephalic, without obvious abnormality, atraumatic Throat: lips, mucosa, and tongue normal; teeth and gums normal Neck: no adenopathy, no carotid bruit, no JVD, supple, symmetrical, trachea midline and thyroid not enlarged, symmetric, no tenderness/mass/nodules Resp: decreased air entry at bases. No crackles or wheezing. Cardio: regular rate and rhythm, S1, S2 normal, no murmur, click, rub or gallop GI: soft, bowel sounds present, non tender, no masses or organomegaly. Extremities: extremities normal, atraumatic, no cyanosis or edema Skin: Skin color, texture, turgor normal. No rashes or lesions Neurologic: Grossly normal  Lab Results:  Basename 05/16/11 0600 06/02/11 0545  WBC 4.9 6.7  HGB 10.2* 10.1*  HCT 31.5* 30.8*  PLT 261 242   BMET  Basename 05/16/11 0600 June 02, 2011 0545  NA 139 137  K 3.8 3.7  CL 101 101  CO2 26 27  GLUCOSE 113* 116*  BUN 25* 22  CREATININE 1.06 0.92  CALCIUM 8.7 8.7    Studies/Results: Dg Abd 2 Views  2011/06/02  *RADIOLOGY REPORT*  Clinical Data: Small bowel obstruction  ABDOMEN - 2 VIEW  Comparison: Plain films 05/13/2011  Findings: NG tube is unchanged.  Dilated loops of small bowel measure up to 2.8 cm which is improved compared  to prior.  No intraperitoneal free air is evident on the left lateral decubitus view.  There is gas within the rectum.  IMPRESSION: Improving small bowel obstruction pattern.  Original Report Authenticated By: Genevive Bi, [name]    Medications:  Scheduled:    . antiseptic oral rinse  15 mL Mouth Rinse q12n4p  . aspirin EC  81 mg Oral Daily  . digoxin  0.0625 mg Oral Daily  . enoxaparin (LOVENOX) injection  30 mg Subcutaneous Q24H  . furosemide  40 mg Oral Daily  . levothyroxine  100 mcg Oral QAC breakfast  . pantoprazole  40 mg Oral Q1200  . potassium chloride  30 mEq Oral TID  . simvastatin  20 mg Oral q1800  . Tadalafil (PAH)  20 mg Oral Daily     Assessment/Plan:  Principal Problem:  *SBO (small bowel obstruction) Active Problems:  Pulmonary hypertension  RHF (right heart failure)  Anemia  Emphysema  Hypothyroidism    1. SBO: High grade most likely due to adhesions. Improving. Ng removed yesterday and started on ice chips. Await further input from surgery. On TPN.  2. Pulmonary hypertension and RHF (right heart failure): On Milrinone infusion (even at home). Heart failure team is following. Well compensated.   3. Hypokalemia: Resolved.  4. Left hydronephrosis: Chronic, appears to be 2/2 to femoral bypass into left leg. Urology evaluation as an outpatient . Patient with CKD III, baseline creatinine 0.9 - 1.4, currently stable and with adequate urine output.   5. DVT Prophylaxis: SCD's.   6. Difficulty of extension of Left ankle : Possibly secondary to arthritis. X ray left ankle showed no fracture.she was seen by PT , no followup recommended.   7. Severe malnutrition: Continue TPN, advance diet as per surgical recommendation.   8. Emphysema/COPD: Quit nicotine 4 months ago. Stable.  9. Hypothyroidism: On levothyroxine.  Disposition: To home with daughter when medically stable. Hopefully by end of this week.    LOS: 12 days   [facility] Pager [phone] 05/16/2011, 9:35 AM

## 2011-05-16 NOTE — Progress Notes (Signed)
Nutrition Follow-up  Diet Order:  NPO except ice chips.  Patient is trying chips/water for tolerance, 60 ml this morning. NG tube removed. TPN running at 50 ml/hr and lipids at 6 ml/hr on MWF. This provides 975 kcal and 60 gm protein  weekly average.   Meds: Scheduled Meds:   . antiseptic oral rinse  15 mL Mouth Rinse q12n4p  . aspirin EC  81 mg Oral Daily  . digoxin  0.0625 mg Oral Daily  . enoxaparin (LOVENOX) injection  30 mg Subcutaneous Q24H  . furosemide  40 mg Oral Daily  . levothyroxine  100 mcg Oral QAC breakfast  . pantoprazole  40 mg Oral Q1200  . potassium chloride  30 mEq Oral TID  . simvastatin  20 mg Oral q1800  . Tadalafil (PAH)  20 mg Oral Daily   Continuous Infusions:   . amiodarone (NEXTERONE PREMIX) 360 mg/200 mL dextrose 30 mg/hr (05/16/11 0805)  . TPN (CLINIMIX) +/- additives 50 mL/hr at 05/14/11 1817   And  . fat emulsion 250 mL (05/14/11 1818)  . TPN (CLINIMIX) +/- additives     And  . fat emulsion    . milrinone 0.25 mcg/kg/min (05/16/11 0805)  . TPN (CLINIMIX) +/- additives 55 mL/hr at 05/15/11 1803   PRN Meds:.acetaminophen, acetaminophen, phenol, promethazine, sodium chloride, sodium chloride  Labs:  CMP     Component Value Date/Time   NA 139 05/16/2011 0600   K 3.8 05/16/2011 0600   CL 101 05/16/2011 0600   CO2 26 05/16/2011 0600   GLUCOSE 113* 05/16/2011 0600   BUN 25* 05/16/2011 0600   CREATININE 1.06 05/16/2011 0600   CALCIUM 8.7 05/16/2011 0600   PROT 5.9* 05/14/2011 0500   ALBUMIN 2.7* 05/14/2011 0500   AST 16 05/14/2011 0500   ALT 11 05/14/2011 0500   ALKPHOS 66 05/14/2011 0500   BILITOT 0.2* 05/14/2011 0500   GFRNONAA 50* 05/16/2011 0600   GFRAA 58* 05/16/2011 0600     Intake/Output Summary (Last 24 hours) at 05/16/11 1034 Last data filed at 05/16/11 0900  Gross per 24 hour  Intake 1905.24 ml  Output   1500 ml  Net 405.24 ml    Weight Status:  95 lbs, weight stable between 95-100 lbs except for 1/29 patient had increase of 6 lbs in one  day to 103 lbs, accuracy of this weight?  Re-estimated needs:  Remains unchanged, 1200-1300 kcal and 55-65 gm protein.  Current TPN regimen is meeting 81% minimum kcal needs, 109% of minimum protein needs.  Nutrition Dx:  Malnutrition, ongoing  Goal:  TPN to meet > 90% of protein needs, met  Intervention:  No nutrition interventions at this time.  As diet progresses consider weaning TPN slowly.   Monitor:  TPN, diet advance, weight, labs   Clarene Duke MARIE Pager #:  [phone]

## 2011-05-16 NOTE — Progress Notes (Signed)
PARENTERAL NUTRITION CONSULT NOTE - FOLLOW UP  Pharmacy Consult for TPN Indication: SBO  Allergies  Allergen Reactions  . Iodine Swelling  . Lisinopril Swelling    throat  . Omnipaque (Iohexol) Other (See Comments)    Unknown  . Penicillins Swelling    Patient Measurements: Height: 5\' 4"  (162.6 cm) Weight: 95 lb 4.8 oz (43.228 kg) (scale c) IBW/kg (Calculated) : 54.7   Vital Signs: Temp: 98.1 F (36.7 C) (01/30 0629) Temp src: Oral (01/30 0629) BP: 132/76 mmHg (01/30 0629) Pulse Rate: 74  (01/30 0629) Intake/Output from previous day: 01/29 0701 - 01/30 0700 In: 1034.8 [I.V.:380.8; TPN:654] Out: 1800 [Urine:1800] Intake/Output from this shift:    Labs:  Basename 05/16/11 0600 05/15/11 0545 05/14/11 0500  WBC 4.9 6.7 6.1  HGB 10.2* 10.1* 9.8*  HCT 31.5* 30.8* 30.2*  PLT 261 242 222  APTT -- -- --  INR -- -- --     Basename 05/16/11 0600 05/15/11 0545 05/14/11 0500  NA 139 137 138  K 3.8 3.7 3.5  CL 101 101 100  CO2 26 27 28   GLUCOSE 113* 116* 117*  BUN 25* 22 20  CREATININE 1.06 0.92 0.98  LABCREA -- -- --  CREAT24HRUR -- -- --  CALCIUM 8.7 8.7 8.4  MG -- -- 1.9  PHOS -- 3.8 4.8*  PROT -- -- 5.9*  ALBUMIN -- -- 2.7*  AST -- -- 16  ALT -- -- 11  ALKPHOS -- -- 66  BILITOT -- -- 0.2*  BILIDIR -- -- --  IBILI -- -- --  PREALBUMIN -- -- --  TRIG -- -- --  CHOLHDL -- -- --  CHOL -- -- --   Estimated Creatinine Clearance: 30.8 ml/min (by C-G formula based on Cr of 1.06).   No results found for this basename: GLUCAP:3 in the last 72 hours  Insulin Requirements in the past 12 hours:  None. CBGs and SSI d/c'd  Current Nutrition:  NPO except ice chips. Current TPN provides avg daily 1100 Kcal and 60gm protein   Nutritional Goals:  1200-1300 kCal, 55-65 grams of protein per day; 1.5L/d  Assessment: Patient with persistent SBO, for non-operative management. NGT d/c 1/29.  GI SBO. recurrent N/V and distention when diet advanced to solids.  Non-surg mngmnt;+flatus/BM. NG d/c'd. Slow advancement of diet starting with ice chips.  Endo Glucose 113 on BMET  Lytes/Renal WNL today.  Hepatobil LFTs WNL, chol WNL 101, trig slightly elevated 172 with hx HLD. Prealbumin 13.5 slightly below goal 18-40; likely d/t inflammation and low PO intake  Px po PPI, LMWH   Plan:  1. Will continue Clinimix E 5/15 at previous 1ml/hr (1 whole bag) 2. MWF supplementation of MVI, TE and lipids (9ml/hr) d/t Publishing copy.

## 2011-05-16 NOTE — Progress Notes (Signed)
Patient seen and examined. Agree with Ms.  Rayburn's note.  Three stools yesterday. Passing flatus today. Hungry. Minimal pain,  but not pain free.  Abdomen:  Soft , non-tender. No hernia or mass. Seems less distended.  Hope SBO resolving. To try clear liquids cautiously.  Angelia Mould. Derrell Lolling, [name], [facility] General and Minimally invasive Surgery Breast and Colorectal Surgery Office:   [phone] Pager:   [phone]

## 2011-05-16 NOTE — Plan of Care (Signed)
Problem: Phase III Progression Outcomes Goal: Activity at appropriate level-compared to baseline (UP IN CHAIR FOR HEMODIALYSIS)  Outcome: Progressing Patient ambulated with 1 person assist down the unit hall.

## 2011-05-17 LAB — COMPREHENSIVE METABOLIC PANEL
ALT: 16 U/L (ref 0–35)
AST: 24 U/L (ref 0–37)
Albumin: 2.7 g/dL — ABNORMAL LOW (ref 3.5–5.2)
Alkaline Phosphatase: 65 U/L (ref 39–117)
CO2: 24 mEq/L (ref 19–32)
Chloride: 101 mEq/L (ref 96–112)
GFR calc non Af Amer: 48 mL/min — ABNORMAL LOW (ref >90)
Potassium: 3.9 mEq/L (ref 3.5–5.1)
Sodium: 136 mEq/L (ref 135–145)
Total Bilirubin: 0.2 mg/dL — ABNORMAL LOW (ref 0.3–1.2)

## 2011-05-17 LAB — PHOSPHORUS: Phosphorus: 4.6 mg/dL (ref 2.3–4.6)

## 2011-05-17 MED ORDER — CLINIMIX E/DEXTROSE (5/15) 5 % IV SOLN
INTRAVENOUS | Status: DC
  Filled 2011-05-17: qty 2000

## 2011-05-17 MED ORDER — TRACE MINERALS CR-CU-MN-SE-ZN 10-1000-500-60 MCG/ML IV SOLN: INTRAVENOUS | Status: DC

## 2011-05-17 MED ORDER — FAT EMULSION 20 % IV EMUL: 250.0000 mL | INTRAVENOUS | Status: DC

## 2011-05-17 MED ORDER — AMIODARONE HCL 200 MG PO TABS
200.0000 mg | ORAL_TABLET | Freq: Every day | ORAL | Status: DC
  Administered 2011-05-17 – 2011-05-19 (×3): 200 mg via ORAL
  Filled 2011-05-17 (×3): qty 1

## 2011-05-17 MED ORDER — CLINIMIX E/DEXTROSE (5/15) 5 % IV SOLN
INTRAVENOUS | Status: AC
  Administered 2011-05-17: 18:00:00 via INTRAVENOUS
  Filled 2011-05-17: qty 1000

## 2011-05-17 MED ORDER — TRACE MINERALS CR-CU-MN-SE-ZN 10-1000-500-60 MCG/ML IV SOLN
INTRAVENOUS | Status: AC
  Filled 2011-05-17: qty 2000

## 2011-05-17 NOTE — Progress Notes (Signed)
Patient ID: Kelly Hart, female   DOB: September 21, 1932, 78 y.o.   MRN: [id number]    Subjective: Pt feels well.  Tolerating clears.  No nausea, no abd pain, +BMs  Objective: Vital signs in last 24 hours: Temp:  [97.2 F (36.2 C)-98.8 F (37.1 C)] 98.8 F (37.1 C) (01/31 0742) Pulse Rate:  [76-102] 81  (01/31 0742) Resp:  [16-18] 18  (01/31 0742) BP: (107-154)/(64-80) 134/64 mmHg (01/31 0742) SpO2:  [93 %-100 %] 98 % (01/31 0742) Weight:  [94 lb 12.8 oz (43 kg)] 94 lb 12.8 oz (43 kg) (01/31 0416) Last BM Date: 05/16/11  Intake/Output from previous day: 01/30 0701 - 01/31 0700 In: 2638.3 [P.O.:540; I.V.:473.9; TPN:1624.4] Out: 1325 [Urine:1325] Intake/Output this shift:    PE: Abd: soft, NT, Nd, +BS  Lab Results:   Basename 05/16/11 0600 05/15/11 0545  WBC 4.9 6.7  HGB 10.2* 10.1*  HCT 31.5* 30.8*  PLT 261 242   BMET  Basename 05/17/11 0516 05/16/11 0600  NA 136 139  K 3.9 3.8  CL 101 101  CO2 24 26  GLUCOSE 257* 113*  BUN 27* 25*  CREATININE 1.09 1.06  CALCIUM 8.6 8.7   PT/INR No results found for this basename: LABPROT:2,INR:2 in the last 72 hours   Studies/Results: No results found.  Anti-infectives: Anti-infectives    None       Assessment/Plan  1. SBO, resolving  Plan: 1. Begin to wean TNA 2. Advance to full liquids.   LOS: 13 days    Lynesha Bango E 05/17/2011

## 2011-05-17 NOTE — Progress Notes (Addendum)
PARENTERAL NUTRITION CONSULT NOTE - FOLLOW UP  Pharmacy Consult for TPN Indication: SBO  Allergies  Allergen Reactions  . Iodine Swelling  . Lisinopril Swelling    throat  . Omnipaque (Iohexol) Other (See Comments)    Unknown  . Penicillins Swelling    Patient Measurements: Height: 5\' 4"  (162.6 cm) Weight: 94 lb 12.8 oz (43 kg) (scale C) IBW/kg (Calculated) : 54.7   Vital Signs: Temp: 98.8 F (37.1 C) (01/31 0742) Temp src: Oral (01/31 0742) BP: 134/64 mmHg (01/31 0742) Pulse Rate: 81  (01/31 0742) Intake/Output from previous day: 01/30 0701 - 01/31 0700 In: 2638.3 [P.O.:540; I.V.:473.9; WUJ:8119.1] Out: 1325 [Urine:1325] Intake/Output from this shift:    Labs:  [facility] 05/16/11 0600 05/15/11 0545  WBC 4.9 6.7  HGB 10.2* 10.1*  HCT 31.5* 30.8*  PLT 261 242  APTT -- --  INR -- --     Basename 05/17/11 0516 05/16/11 0600 05/15/11 0545  NA 136 139 137  K 3.9 3.8 3.7  CL 101 101 101  CO2 24 26 27   GLUCOSE 257* 113* 116*  BUN 27* 25* 22  CREATININE 1.09 1.06 0.92  LABCREA -- -- --  CREAT24HRUR -- -- --  CALCIUM 8.6 8.7 8.7  MG 1.8 -- --  PHOS 4.6 -- 3.8  PROT 5.8* -- --  ALBUMIN 2.7* -- --  AST 24 -- --  ALT 16 -- --  ALKPHOS 65 -- --  BILITOT 0.2* -- --  BILIDIR -- -- --  IBILI -- -- --  PREALBUMIN -- -- --  TRIG -- -- --  CHOLHDL -- -- --  CHOL -- -- --   Estimated Creatinine Clearance: 29.8 ml/min (by C-G formula based on Cr of 1.09).   No results found for this basename: GLUCAP:3 in the last 72 hours  Insulin Requirements in the past 12 hours:  None. CBGs and SSI d/c'd  Current Nutrition:  PO: tolerated clears Current TPN provides avg daily 1100 Kcal and 60gm protein   Nutritional Goals:  1200-1300 kCal, 55-65 grams of protein per day; 1.5L/d  Assessment: Patient with persistent SBO, for non-operative management. NGT d/c 1/29.  GI SBO. recurrent N/V and distention when diet advanced to solids. Non-surg mngmnt;+flatus/BM. NG d/c'd.  Slow advancement of diet - on clears yesterday.  Endo Glucose elevated on BMET - will follow for trend  Lytes/Renal WNL today. Watch phos - has been trending up  Hepatobil LFTs WNL, chol WNL 101, trig slightly elevated 172 with hx HLD. Prealbumin 13.5 slightly below goal 18-40; likely d/t inflammation and low PO intake  Px po PPI, LMWH   Plan:  1. Continue Clinimix E 5/15 at previous 73ml/hr (1 whole bag) 2. MWF supplementation of MVI, TE and lipids (60ml/hr) d/t Publishing copy.  Maudine Kluesner L. Illene Bolus, PharmD, BCPS Clinical Pharmacist Pager: [phone] 05/17/2011 8:25 AM      Addendum: spoke with Barnetta Chapel, PA and received orders to decrease tna to half-rate now and continue half rate tna with next bag.  They will assess po intake in am to decide on d/c'ing tna at that time.  Gustin Zobrist L. Illene Bolus, PharmD, BCPS Clinical Pharmacist Pager: [phone] 05/17/2011 10:10 AM

## 2011-05-17 NOTE — Progress Notes (Signed)
Patient seen and examined. Agree with assessment and care plan as outlined by Ms. Osborne.  SBO resolving. Try full liquids today.  Angelia Mould. Derrell Lolling, [name], [facility] General and Minimally invasive Surgery Breast and Colorectal Surgery Office:   [phone] Pager:   [phone]

## 2011-05-17 NOTE — Progress Notes (Signed)
PT Cancellation Note    Treatment cancelled today due to pt has walked 3 times today and just back recently with her daughter.  "not today."  05/17/2011  Finlayson Bing, PT [phone] (pager)

## 2011-05-17 NOTE — Progress Notes (Addendum)
Advanced Heart Failure Rounding Note   Subjective:    Kelly Hart is a 78 y/o woman with COPD and severe right heart failure on home milrinone. Presents to ER with 1-2 weeks of intermittent nausea, abdominal  pain and poor po intake. No HF symptoms. On exams no evidence of fluid overload. Labs within normal limits except for UTI. CT abdomen with prominent SBO.   Seen by [facility] on 1/25 after she had more ab pain and KUB with worsening SBO. NG tube removed 2 days and clear liquid diet started.    Feels a lot better today.  +BM, N/V.  No abdominal pain.  Walking in halls without difficulty.     Objective:    Vital Signs:   Temp:  [97.2 F (36.2 C)-98.8 F (37.1 C)] 98.8 F (37.1 C) (01/31 0742) Pulse Rate:  [76-102] 81  (01/31 0742) Resp:  [16-18] 18  (01/31 0742) BP: (107-154)/(64-80) 134/64 mmHg (01/31 0742) SpO2:  [93 %-100 %] 98 % (01/31 0742) Weight:  [43 kg (94 lb 12.8 oz)] 43 kg (94 lb 12.8 oz) (01/31 0416) Last BM Date: 05/16/11  24-hour weight change: Weight change: -0.228 kg (-8 oz)  Intake/Output:   Intake/Output Summary (Last 24 hours) at 05/17/11 0930 Last data filed at 05/17/11 0656  Gross per 24 hour  Intake 2510.21 ml  Output   1325 ml  Net 1185.21 ml     Physical Exam: General:  Well appearing. No resp difficulty HEENT: normal Neck: supple. JVP 7-8 . Carotids 2+ bilat; no bruits. No lymphadenopathy or thryomegaly appreciated. Cor: PMI nondisplaced. Regular rate & rhythm. No rubs, gallops or murmurs. Lungs: clear Abdomen: soft, nontender, nondistended. No hepatosplenomegaly. No bruits or masses. Bowel sounds present Extremities: no cyanosis, clubbing, rash, edema Neuro: alert & orientedx3, cranial nerves grossly intact. moves all 4 extremities w/o difficulty. Affect pleasant  Telemetry: Sinus Rhythm   Labs: Basic Metabolic Panel:  Lab 05/17/11 1610 05/16/11 0600 05/15/11 0545 05/14/11 0500 05/13/11 0500 05/12/11 0500 05/11/11 0925  NA 136 139 137 138 140 --  --  K 3.9 3.8 3.7 3.5 3.9 -- --  CL 101 101 101 100 104 -- --  CO2 24 26 27 28 27  -- --  GLUCOSE 257* 113* 116* 117* 114* -- --  BUN 27* 25* 22 20 18  -- --  CREATININE 1.09 1.06 0.92 0.98 1.07 -- --  CALCIUM 8.6 8.7 8.7 -- -- -- --  MG 1.8 -- -- 1.9 1.7 2.2 1.4*  PHOS 4.6 -- 3.8 4.8* 3.9 3.1 --    Liver Function Tests:  Lab 05/17/11 0516 05/14/11 0500 05/12/11 0500  AST 24 16 18   ALT 16 11 16   ALKPHOS 65 66 66  BILITOT 0.2* 0.2* 0.3  PROT 5.8* 5.9* 5.8*  ALBUMIN 2.7* 2.7* 2.8*   No results found for this basename: LIPASE:5,AMYLASE:5 in the last 168 hours No results found for this basename: AMMONIA:3 in the last 168 hours  CBC:  Lab 05/16/11 0600 05/15/11 0545 05/14/11 0500 05/13/11 0500 05/12/11 0500  WBC 4.9 6.7 6.1 7.3 5.9  NEUTROABS -- -- 4.5 -- 4.4  HGB 10.2* 10.1* 9.8* 9.8* 9.6*  HCT 31.5* 30.8* 30.2* 30.1* 30.2*  MCV 84.2 86.3 86.0 86.5 86.8  PLT 261 242 222 211 226    Cardiac Enzymes: No results found for this basename: CKTOTAL:5,CKMB:5,CKMBINDEX:5,TROPONINI:5 in the last 168 hours  BNP: No components found with this basename: POCBNP:5  CBG:  Lab 05/13/11 0548 05/12/11 2353 05/12/11 1608 05/12/11 1300 05/12/11 9604  GLUCAP 124* 132* 123* 131* 132*    Coagulation Studies: No results found for this basename: LABPROT:5,INR:5 in the last 72 hours  Other results: 05/10/11 ECHO-.EF 505-55%  Ventricular septum: Septal motion showed "bounce". The contour showed diastolic flattening and systolic flattening. - Right ventricle: The cavity size was moderately dilated. Systolic function was mildly reduced. - Right atrium: The atrium was mildly dilated. - Tricuspid valve: Severe regurgitation. - Pulmonary arteries: Systolic pressure was moderately increased.    Imaging: No results found.   Medications:     Scheduled Medications:    . antiseptic oral rinse  15 mL Mouth Rinse q12n4p  . aspirin EC  81 mg Oral Daily  . digoxin  0.0625 mg Oral Daily  .  enoxaparin (LOVENOX) injection  30 mg Subcutaneous Q24H  . furosemide  40 mg Oral Daily  . levothyroxine  100 mcg Oral QAC breakfast  . pantoprazole  40 mg Oral Q1200  . simvastatin  20 mg Oral q1800  . Tadalafil (PAH)  20 mg Oral Daily    Infusions:    . amiodarone (NEXTERONE PREMIX) 360 mg/200 mL dextrose 30 mg/hr (05/17/11 0128)  . TPN (CLINIMIX) +/- additives 50 mL/hr at 05/16/11 1841   And  . fat emulsion 250 mL (05/16/11 1841)  . milrinone 0.25 mcg/kg/min (05/16/11 1144)  . TPN (CLINIMIX) +/- additives 55 mL/hr at 05/15/11 1803  . TPN (CLINIMIX) +/- additives      PRN Medications: acetaminophen, acetaminophen, phenol, promethazine, sodium chloride, sodium chloride   Assessment:   1) SBO  2) Severe right heart failure on home milrinone and Adcirca  3) COPD.  4) PAD s/p aortobifem graft  5) CRI, stage III-IV  6) DM2, with symptomatic hypoglycemia   Plan/Discussion:    NG tube out, doing well.  May be able to advance diet today but management of SBO per [facility].  Volume status looks good.  Will transition back to oral medications today.  Continue to follow daily weights and renal function.  Continue milrinone.      Length of Stay: 13  Robin Bradley,PAC 9:30 AM   Patient seen and examined with Ulyess Blossom PA-C. We discussed all aspects of the encounter. I agree with the assessment and plan as stated above. She is progressing nicely.  Appreciate [facility] and [facility] care.  Aarik Blank,MD 3:41 PM

## 2011-05-17 NOTE — Progress Notes (Signed)
PCP: HASANAJ,XAJE A, MD, MD  Brief HPI:  Kelly Hart is an 78 y.o. female who presented with complaints of nausea and vomiting and generalized ABD Pain X 1 week. She denied hematemesis, and reported not being able to hold down foods or liquids. She reported passing loose stools. She denied having fevers or chills. In the ED a CT scan of the ABD and pelvis revealed a Small Bowel Obstruction and patient was referred for admission.   Past medical history:  Past Medical History   Diagnosis  Date   .  Hypertension    .  Renal insufficiency      baseline around cr 1.3   .  Atrial fibrillation    .  COPD (chronic obstructive pulmonary disease)    .  CHF (congestive heart failure)      diastolic   .  PEA (Pulseless electrical activity)  Oct 2012   .  PAD (peripheral artery disease)    .  Hyperlipidemia    .  Hypothyroidism    .  History of cervical cancer    .  Chronic diarrhea      secondary to treatment of her cervical cancer   .  Hydronephrosis, left    .  Pulmonary hypertension    .  GERD (gastroesophageal reflux disease)    .  Myocardial infarction      Consultants: Heart Failure Team, CCS  Procedures: None so far  Subjective: Patient feels fine. No nausea or vomiting. Had loose stools yesterday. Last BM was at 2 AM. No abdominal pain. Denies CP or SOB.  Objective: Vital signs in last 24 hours: Temp:  [97.2 F (36.2 C)-98.8 F (37.1 C)] 98.8 F (37.1 C) (01/31 0742) Pulse Rate:  [76-102] 81  (01/31 0742) Resp:  [16-18] 18  (01/31 0742) BP: (107-154)/(64-80) 134/64 mmHg (01/31 0742) SpO2:  [93 %-100 %] 98 % (01/31 0742) Weight:  [43 kg (94 lb 12.8 oz)] 43 kg (94 lb 12.8 oz) (01/31 0416) Weight change: -0.228 kg (-8 oz) Last BM Date: 05/16/11  Intake/Output from previous day: 01/30 0701 - 01/31 0700 In: 2638.3 [P.O.:540; I.V.:473.9; BJY:7829.5] Out: 1325 [Urine:1325] Intake/Output this shift:    General appearance: alert, cooperative, appears stated age and  no distress Head: Normocephalic, without obvious abnormality, atraumatic Resp: decreased air entry at bases. No crackles or wheezing. Cardio: regular rate and rhythm, S1, S2 normal, no murmur, click, rub or gallop GI: soft, bowel sounds present, non tender, no masses or organomegaly. Extremities: extremities normal, atraumatic, no cyanosis or edema Skin: Skin color, texture, turgor normal. No rashes or lesions Neurologic: Grossly normal  Lab Results:  Basename 05/16/11 0600 05/15/11 0545  WBC 4.9 6.7  HGB 10.2* 10.1*  HCT 31.5* 30.8*  PLT 261 242   BMET  Basename 05/17/11 0516 05/16/11 0600  NA 136 139  K 3.9 3.8  CL 101 101  CO2 24 26  GLUCOSE 257* 113*  BUN 27* 25*  CREATININE 1.09 1.06  CALCIUM 8.6 8.7    Studies/Results: No results found.  Medications:  Scheduled:    . amiodarone  200 mg Oral Daily  . antiseptic oral rinse  15 mL Mouth Rinse q12n4p  . aspirin EC  81 mg Oral Daily  . digoxin  0.0625 mg Oral Daily  . enoxaparin (LOVENOX) injection  30 mg Subcutaneous Q24H  . furosemide  40 mg Oral Daily  . levothyroxine  100 mcg Oral QAC breakfast  . pantoprazole  40 mg Oral  Q1200  . simvastatin  20 mg Oral q1800  . Tadalafil (PAH)  20 mg Oral Daily    Assessment/Plan:  Principal Problem:  *SBO (small bowel obstruction) Active Problems:  Pulmonary hypertension  RHF (right heart failure)  Anemia  Emphysema  Hypothyroidism    1. SBO: Most likely due to adhesions. Improving. NG removed 1/29. Advanced to clear liquids. On TPN. CCS managing.  2. Pulmonary hypertension and RHF (right heart failure): On Milrinone infusion (even at home). Heart failure team is following. Well compensated.   3. Left hydronephrosis: ?Chronic, appears to be 2/2 to femoral bypass into left leg. Urology evaluation as an outpatient . Patient with CKD III, baseline creatinine 0.9 - 1.4, currently stable and with adequate urine output.   4. DVT Prophylaxis: SCD's.   5.  Difficulty of extension of Left ankle : Possibly secondary to arthritis. X ray left ankle showed no fracture.she was seen by PT , no followup recommended.   6. Severe malnutrition: Continue TPN, advance diet as per surgical recommendation.   7. Emphysema/COPD: Quit nicotine 4 months ago. Stable.  8. Hypothyroidism: On levothyroxine.  9. Hyperglycemia: Likely spurious. Recheck in AM.  Disposition: To home with daughter when medically stable. Hopefully by end of this week or over the weekend.    LOS: 13 days   [facility] Pager [phone] 05/17/2011, 9:45 AM

## 2011-05-18 LAB — BASIC METABOLIC PANEL
CO2: 23 mEq/L (ref 19–32)
Calcium: 8.3 mg/dL — ABNORMAL LOW (ref 8.4–10.5)
Chloride: 105 mEq/L (ref 96–112)
Creatinine, Ser: 1.12 mg/dL — ABNORMAL HIGH (ref 0.50–1.10)
Glucose, Bld: 93 mg/dL (ref 70–99)

## 2011-05-18 LAB — PHOSPHORUS: Phosphorus: 4 mg/dL (ref 2.3–4.6)

## 2011-05-18 MED ORDER — FAT EMULSION 20 % IV EMUL
250.0000 mL | INTRAVENOUS | Status: DC
  Administered 2011-05-18: 250 mL via INTRAVENOUS
  Filled 2011-05-18: qty 250

## 2011-05-18 MED ORDER — POTASSIUM CHLORIDE CRYS ER 20 MEQ PO TBCR
40.0000 meq | EXTENDED_RELEASE_TABLET | Freq: Once | ORAL | Status: AC
  Administered 2011-05-18: 40 meq via ORAL
  Filled 2011-05-18: qty 2

## 2011-05-18 MED ORDER — TRACE MINERALS CR-CU-MN-SE-ZN 10-1000-500-60 MCG/ML IV SOLN
INTRAVENOUS | Status: DC
  Administered 2011-05-18: 17:00:00 via INTRAVENOUS
  Filled 2011-05-18: qty 1000

## 2011-05-18 NOTE — Progress Notes (Signed)
Subjective: Up in bed trying to eat Oatmeal without butter.  No distension, taking full liquids, +BM, multiple yesterday.   Objective: Vital signs in last 24 hours: Temp:  [97.7 F (36.5 C)-98.2 F (36.8 C)] 97.9 F (36.6 C) (02/01 0446) Pulse Rate:  [78-85] 78  (02/01 1007) Resp:  [17-18] 18  (02/01 0446) BP: (111-162)/(66-85) 162/85 mmHg (02/01 0446) SpO2:  [93 %-99 %] 99 % (02/01 0446) Weight:  [43.4 kg (95 lb 10.9 oz)] 43.4 kg (95 lb 10.9 oz) (02/01 0446) Last BM Date: 05/17/11  Intake/Output from previous day: 01/31 0701 - 02/01 0700 In: 744.8 [P.O.:30; I.V.:73.6; TPN:641.3] Out: 1100 [Urine:1100] Intake/Output this shift:    General appearance: alert, cooperative and no distress GI: soft, non-tender; bowel sounds normal; no masses,  no organomegaly  Lab Results:   Basename 05/16/11 0600  WBC 4.9  HGB 10.2*  HCT 31.5*  PLT 261    BMET  Basename 05/18/11 0521 05/17/11 0516  NA 140 136  K 3.2* 3.9  CL 105 101  CO2 23 24  GLUCOSE 93 257*  BUN 20 27*  CREATININE 1.12* 1.09  CALCIUM 8.3* 8.6   PT/INR No results found for this basename: LABPROT:2,INR:2 in the last 72 hours  Lab 05/17/11 0516 05/14/11 0500 05/12/11 0500  AST 24 16 18   ALT 16 11 16   ALKPHOS 65 66 66  BILITOT 0.2* 0.2* 0.3  PROT 5.8* 5.9* 5.8*  ALBUMIN 2.7* 2.7* 2.8*     Studies/Results: No results found.  Anti-infectives: Anti-infectives    None     Current Facility-Administered Medications  Medication Dose Route Frequency Provider Last Rate Last Dose  . acetaminophen (TYLENOL) tablet 650 mg  650 mg Oral Q6H PRN Ron Parker, MD       Or  . acetaminophen (TYLENOL) suppository 650 mg  650 mg Rectal Q6H PRN Ron Parker, MD      . amiodarone (PACERONE) tablet 200 mg  200 mg Oral Daily Dolores Patty, MD   200 mg at 05/18/11 1008  . antiseptic oral rinse (BIOTENE) solution 15 mL  15 mL Mouth Rinse q12n4p Vassie Loll, MD   15 mL at 05/17/11 1200  . aspirin EC  tablet 81 mg  81 mg Oral Daily Ron Parker, MD   81 mg at 05/18/11 1008  . digoxin (LANOXIN) tablet 0.0625 mg  0.0625 mg Oral Daily Crystal Stillinger [place], PHARMD   0.0625 mg at 05/18/11 1007  . enoxaparin (LOVENOX) injection 30 mg  30 mg Subcutaneous Q24H Marylouise Stacks, PHARMD   30 mg at 05/18/11 1009  . levothyroxine (SYNTHROID, LEVOTHROID) tablet 100 mcg  100 mcg Oral QAC breakfast Crystal [place], PHARMD   100 mcg at 05/18/11 0531  . milrinone [facility]) infusion 200 mcg/mL (0.2 mg/mL)  0.25 mcg/kg/min Intravenous Continuous Dione Booze, MD 3.1 mL/hr at 05/17/11 1309 0.25 mcg/kg/min at 05/17/11 1309  . pantoprazole (PROTONIX) EC tablet 40 mg  40 mg Oral Q1200 Crystal Stillinger [place], PHARMD   40 mg at 05/17/11 1102  . phenol (CHLORASEPTIC) mouth spray 1 spray  1 spray Mouth/Throat PRN Vassie Loll, MD   1 spray at 05/09/11 [phone]  . potassium chloride SA (K-DUR,KLOR-CON) CR tablet 40 mEq  40 mEq Oral Once [name], NP   40 mEq at 05/18/11 1007  . promethazine (PHENERGAN) injection 12.5 mg  12.5 mg Intravenous Q6H PRN Maren Reamer, NP   12.5 mg at 05/11/11 1426  . simvastatin (ZOCOR) tablet 20 mg  20 mg Oral q1800 Ron Parker, MD   20 mg at 05/17/11 1729  . sodium chloride 0.9 % injection 10-40 mL  10-40 mL Intracatheter PRN Vassie Loll, MD   10 mL at 05/06/11 2119  . sodium chloride 0.9 % injection 10-40 mL  10-40 mL Intracatheter PRN Vassie Loll, MD   10 mL at 05/18/11 0527  . Tadalafil (PAH) TABS 20 mg  20 mg Oral Daily Ron Parker, MD   20 mg at 05/18/11 1018  . tpn solution (CLINIMIX E 5/15) 1,000 mL infusion   Intravenous Continuous TPN Drusilla Kanner, PHARMD 25 mL/hr at 05/17/11 1755    . tpn solution (CLINIMIX E 5/15) 2,000 mL with multivitamins adult 10 mL, trace elements Cr-Cu-Mn-Se-Zn 1 mL infusion   Intravenous Continuous TPN Drusilla Kanner, PHARMD 25 mL/hr at 05/17/11 1109    . DISCONTD: furosemide (LASIX) tablet 40 mg   40 mg Oral Daily Crystal [place], PHARMD   40 mg at 05/17/11 1102    Assessment/Plan SBO, s/pAoBiFem,/Hx Cervical ca. Flatus, +BM with enema.  CAD/CM/CHF on Milrinone at home./Afib/Hx. PEA  PVOD  COPD  Hypothyroid  Gerd  Hypertension  Renal Insuffiency   Plan:  I'm going to switch her to low residual 2gm Na diet.  If she does well with this she can come off TNA, and go back to her normal routine, and medicines as before.  We will see again as needed. Call if we can help.    LOS: 14 days    Ariely Riddell 05/18/2011

## 2011-05-18 NOTE — Progress Notes (Signed)
The patient is seen and examined. I agree with the assessment and care plan as outlined by Mr. Marlyne Beards.  SBO resolved . Will advance diet. Call us PRN further problems.   No indication for surgical followup.  Angelia Mould. Derrell Lolling, [name], [facility] General and Minimally invasive Surgery Breast and Colorectal Surgery Office:   [phone] Pager:   [phone]

## 2011-05-18 NOTE — Progress Notes (Signed)
Advanced Heart Failure Rounding Note   Subjective:    Kelly Hart is a 78 y/o woman with COPD and severe right heart failure on home milrinone. Presents to ER with 1-2 weeks of intermittent nausea, abdominal pain and poor po intake. No HF symptoms. On exams no evidence of fluid overload.   CT abdomen with prominent SBO.   Seen by [facility] on 1/25 after she had more ab pain and KUB with worsening SBO. NG tube removed 3 days ago. Tolerated full liquid diet last night. TPN continues 25 cc/ hour.   Denies SOB/PND/Orthopnea. Ambulating in hall without difficulty      Objective:    Vital Signs:   Temp:  [97.7 F (36.5 C)-98.2 F (36.8 C)] 97.9 F (36.6 C) (02/01 0446) Pulse Rate:  [81-85] 84  (02/01 0446) Resp:  [17-18] 18  (02/01 0446) BP: (111-162)/(66-85) 162/85 mmHg (02/01 0446) SpO2:  [93 %-99 %] 99 % (02/01 0446) Weight:  [43.4 kg (95 lb 10.9 oz)] 43.4 kg (95 lb 10.9 oz) (02/01 0446) Last BM Date: 05/17/11  24-hour weight change: Weight change: 0.4 kg (14.1 oz)  Intake/Output:   Intake/Output Summary (Last 24 hours) at 05/18/11 0804 Last data filed at 05/18/11 0640  Gross per 24 hour  Intake 744.83 ml  Output   1100 ml  Net -355.17 ml     Physical Exam: General:  Well appearing. No resp difficulty HEENT: normal Neck: supple. JVP flat. Carotids 2+ bilat; no bruits. No lymphadenopathy or thryomegaly appreciated. Cor: PMI nondisplaced. Regular rate & rhythm. No rubs, gallops or murmurs. Lungs: RLL decreased Abdomen: soft, nontender, nondistended. No hepatosplenomegaly. No bruits or masses. Good bowel sounds. Extremities: no cyanosis, clubbing, rash, edema Neuro: alert & orientedx3, cranial nerves grossly intact. moves all 4 extremities w/o difficulty. Affect pleasant  Telemetry: Sinus Rhythm  Labs: Basic Metabolic Panel:  Lab 05/18/11 1610 05/17/11 0516 05/16/11 0600 05/15/11 0545 05/14/11 0500 05/13/11 0500 05/12/11 0500 05/11/11 0925  NA 140 136 139 137 138 -- -- --  K  3.2* 3.9 3.8 3.7 3.5 -- -- --  CL 105 101 101 101 100 -- -- --  CO2 23 24 26 27 28  -- -- --  GLUCOSE 93 257* 113* 116* 117* -- -- --  BUN 20 27* 25* 22 20 -- -- --  CREATININE 1.12* 1.09 1.06 0.92 0.98 -- -- --  CALCIUM 8.3* 8.6 8.7 -- -- -- -- --  MG -- 1.8 -- -- 1.9 1.7 2.2 1.4*  PHOS 4.0 4.6 -- 3.8 4.8* 3.9 -- --    Liver Function Tests:  Lab 05/17/11 0516 05/14/11 0500 05/12/11 0500  AST 24 16 18   ALT 16 11 16   ALKPHOS 65 66 66  BILITOT 0.2* 0.2* 0.3  PROT 5.8* 5.9* 5.8*  ALBUMIN 2.7* 2.7* 2.8*   No results found for this basename: LIPASE:5,AMYLASE:5 in the last 168 hours No results found for this basename: AMMONIA:3 in the last 168 hours  CBC:  Lab 05/16/11 0600 05/15/11 0545 05/14/11 0500 05/13/11 0500 05/12/11 0500  WBC 4.9 6.7 6.1 7.3 5.9  NEUTROABS -- -- 4.5 -- 4.4  HGB 10.2* 10.1* 9.8* 9.8* 9.6*  HCT 31.5* 30.8* 30.2* 30.1* 30.2*  MCV 84.2 86.3 86.0 86.5 86.8  PLT 261 242 222 211 226    Cardiac Enzymes: No results found for this basename: CKTOTAL:5,CKMB:5,CKMBINDEX:5,TROPONINI:5 in the last 168 hours  BNP: No components found with this basename: POCBNP:5  CBG:  Lab 05/13/11 0548 05/12/11 2353 05/12/11 1608 05/12/11 1300 05/12/11  0624  GLUCAP 124* 132* 123* 131* 132*    Coagulation Studies: No results found for this basename: LABPROT:5,INR:5 in the last 72 hours  Other results: EKG:   Imaging:  No results found.   Medications:     Scheduled Medications:    . amiodarone  200 mg Oral Daily  . antiseptic oral rinse  15 mL Mouth Rinse q12n4p  . aspirin EC  81 mg Oral Daily  . digoxin  0.0625 mg Oral Daily  . enoxaparin (LOVENOX) injection  30 mg Subcutaneous Q24H  . furosemide  40 mg Oral Daily  . levothyroxine  100 mcg Oral QAC breakfast  . pantoprazole  40 mg Oral Q1200  . simvastatin  20 mg Oral q1800  . Tadalafil (PAH)  20 mg Oral Daily     Infusions:    . milrinone 0.25 mcg/kg/min (05/17/11 1309)  . TPN (CLINIMIX) +/-  additives 25 mL/hr at 05/17/11 1755  . TPN (CLINIMIX) +/- additives 25 mL/hr at 05/17/11 1109  . DISCONTD: amiodarone (NEXTERONE PREMIX) 360 mg/200 mL dextrose 30 mg/hr (05/17/11 0128)  . DISCONTD: fat emulsion 250 mL (05/16/11 1841)  . DISCONTD: fat emulsion Stopped (05/17/11 1000)  . DISCONTD: TPN (CLINIMIX) +/- additives    . DISCONTD: TPN (CLINIMIX) +/- additives 50 mL/hr at 05/16/11 1841  . DISCONTD: TPN (CLINIMIX) +/- additives Stopped (05/17/11 1000)     PRN Medications:  acetaminophen, acetaminophen, phenol, promethazine, sodium chloride, sodium chloride   Assessment:   1) SBO  2) Severe right heart failure on home milrinone and Adcirca  3) COPD.  4) PAD s/p aortobifem graft  5) CRI, stage III-IV  6) DM2, with symptomatic hypoglycemia  7. Hypokalemia   Plan/Discussion:   Volume status stable. Will stop Lasix. Replace potassium . Continue to follow weights.  Tolerated full liquid diet. Advance diet per general surgery.    Length of Stay: 14   Amy Clegg NP-C 05/18/2011, 8:04 AM  Patient seen and examined with [name], NP. We discussed all aspects of the encounter. I agree with the assessment and plan as stated above. SBO seems to have resolved. Tolerating a regular diet. Ambulating. RHF well compensated. Hopefully home in am. Continue milrinone.   Daniel Bensimhon,MD 4:59 PM

## 2011-05-18 NOTE — Progress Notes (Signed)
CSW spoke to MD about resuming home health orders and home milrinone. MD informed CSW that patient will probably d/c this weekend.  CSW called [facility] to let them know that home milrinone will probably need to be started over the weekend. They agreed to restart patient on Milrinone pump.

## 2011-05-18 NOTE — Progress Notes (Signed)
PT Cancellation Note Patient reports she has been up walking x 2 this am (with mobility team and with nursing).  Reports she will walk later with nursing.  Politely requests to rest now.  Will return at later date. Durenda Hurt Renaldo Fiddler, [facility] Acute Rehab Services Pager [phone]

## 2011-05-18 NOTE — Progress Notes (Signed)
PCP: HASANAJ,XAJE A, MD, MD  Brief HPI:  Kelly Hart is an 78 y.o. female who presented with complaints of nausea and vomiting and generalized ABD Pain X 1 week. She denied hematemesis, and reported not being able to hold down foods or liquids. She reported passing loose stools. She denied having fevers or chills. In the ED a CT scan of the ABD and pelvis revealed a Small Bowel Obstruction and patient was referred for admission.   Past medical history:  Past Medical History   Diagnosis  Date   .  Hypertension    .  Renal insufficiency      baseline around cr 1.3   .  Atrial fibrillation    .  COPD (chronic obstructive pulmonary disease)    .  CHF (congestive heart failure)      diastolic   .  PEA (Pulseless electrical activity)  Oct 2012   .  PAD (peripheral artery disease)    .  Hyperlipidemia    .  Hypothyroidism    .  History of cervical cancer    .  Chronic diarrhea      secondary to treatment of her cervical cancer   .  Hydronephrosis, left    .  Pulmonary hypertension    .  GERD (gastroesophageal reflux disease)    .  Myocardial infarction      Consultants: Heart Failure Team, CCS  Procedures: None so far  Subjective: Patient feels better. Tolerating solids. Bowel movements not as frequent as yesterday. Overall much improved and excited about possibility of going home tomorrow.  Objective: Vital signs in last 24 hours: Temp:  [97.9 F (36.6 C)-98.2 F (36.8 C)] 97.9 F (36.6 C) (02/01 1330) Pulse Rate:  [78-85] 81  (02/01 1330) Resp:  [17-20] 20  (02/01 1330) BP: (111-162)/(63-85) 120/63 mmHg (02/01 1330) SpO2:  [93 %-100 %] 100 % (02/01 1330) Weight:  [43.4 kg (95 lb 10.9 oz)] 43.4 kg (95 lb 10.9 oz) (02/01 0446) Weight change: 0.4 kg (14.1 oz) Last BM Date: 05/17/11  Intake/Output from previous day: 01/31 0701 - 02/01 0700 In: 744.8 [P.O.:30; I.V.:73.6; TPN:641.3] Out: 1100 [Urine:1100] Intake/Output this shift: Total I/O In: 240 [P.O.:240] Out:  600 [Urine:600]  General appearance: alert, cooperative, appears stated age and no distress Resp: decreased air entry at bases. No crackles or wheezing. Cardio: regular rate and rhythm, S1, S2 normal, no murmur, click, rub or gallop GI: soft, bowel sounds present, non tender, no masses or organomegaly. Extremities: extremities normal, atraumatic, no cyanosis or edema Skin: Skin color, texture, turgor normal. No rashes or lesions Neurologic: Grossly normal  Lab Results:  Basename 05/16/11 0600  WBC 4.9  HGB 10.2*  HCT 31.5*  PLT 261   BMET  Basename 05/18/11 0521 05/17/11 0516  NA 140 136  K 3.2* 3.9  CL 105 101  CO2 23 24  GLUCOSE 93 257*  BUN 20 27*  CREATININE 1.12* 1.09  CALCIUM 8.3* 8.6    Studies/Results: No results found.  Medications:  Scheduled:    . amiodarone  200 mg Oral Daily  . antiseptic oral rinse  15 mL Mouth Rinse q12n4p  . aspirin EC  81 mg Oral Daily  . digoxin  0.0625 mg Oral Daily  . enoxaparin (LOVENOX) injection  30 mg Subcutaneous Q24H  . levothyroxine  100 mcg Oral QAC breakfast  . pantoprazole  40 mg Oral Q1200  . potassium chloride  40 mEq Oral Once  . simvastatin  20  mg Oral q1800  . Tadalafil (PAH)  20 mg Oral Daily  . DISCONTD: furosemide  40 mg Oral Daily    Assessment/Plan:  Principal Problem:  *SBO (small bowel obstruction) Active Problems:  Pulmonary hypertension  RHF (right heart failure)  Anemia  Emphysema  Hypothyroidism    1. SBO: Most likely due to adhesions. Improved significantly. NG removed 1/29. Tolerating solid diet. TPN being tapered. CCS has signed off for now.  2. Pulmonary hypertension and RHF (right heart failure): On Milrinone infusion (even at home). Heart failure team is following. Well compensated.   3. Left hydronephrosis: ?Chronic, appears to be 2/2 to femoral bypass into left leg. Urology evaluation as an outpatient . Patient with CKD III, baseline creatinine 0.9 - 1.4, currently stable and  with adequate urine output.   4. DVT Prophylaxis: SCD's.   5. Difficulty of extension of Left ankle : Possibly secondary to arthritis. X ray left ankle showed no fracture. She was seen by PT, no followup recommended.   6. Severe malnutrition: Now back on solid diet. TPN is being tapered.   7. Emphysema/COPD: Quit nicotine 4 months ago. Stable.  8. Hypothyroidism: On levothyroxine.  9. Hyperglycemia 1/31 was spurious.  Disposition: To home with daughter in AM if remains stable. HH agency notified as well.    LOS: 14 days   [facility] Pager [phone] 05/18/2011, 2:48 PM

## 2011-05-18 NOTE — Progress Notes (Signed)
PARENTERAL NUTRITION CONSULT NOTE - FOLLOW UP  Pharmacy Consult for TPN Indication: SBO  Allergies  Allergen Reactions  . Iodine Swelling  . Lisinopril Swelling    throat  . Omnipaque (Iohexol) Other (See Comments)    Unknown  . Penicillins Swelling    Patient Measurements: Height: 5\' 4"  (162.6 cm) Weight: 95 lb 10.9 oz (43.4 kg) IBW/kg (Calculated) : 54.7   Vital Signs: Temp: 97.9 F (36.6 C) (02/01 0446) Temp src: Oral (02/01 0446) BP: 162/85 mmHg (02/01 0446) Pulse Rate: 78  (02/01 1007) Intake/Output from previous day: 01/31 0701 - 02/01 0700 In: 744.8 [P.O.:30; I.V.:73.6; TPN:641.3] Out: 1100 [Urine:1100] Intake/Output from this shift:    Labs:  Basename 05/16/11 0600  WBC 4.9  HGB 10.2*  HCT 31.5*  PLT 261  APTT --  INR --     Basename 05/18/11 0521 05/17/11 0516 05/16/11 0600  NA 140 136 139  K 3.2* 3.9 3.8  CL 105 101 101  CO2 23 24 26   GLUCOSE 93 257* 113*  BUN 20 27* 25*  CREATININE 1.12* 1.09 1.06  LABCREA -- -- --  CREAT24HRUR -- -- --  CALCIUM 8.3* 8.6 8.7  MG -- 1.8 --  PHOS 4.0 4.6 --  PROT -- 5.8* --  ALBUMIN -- 2.7* --  AST -- 24 --  ALT -- 16 --  ALKPHOS -- 65 --  BILITOT -- 0.2* --  BILIDIR -- -- --  IBILI -- -- --  PREALBUMIN -- -- --  TRIG -- -- --  CHOLHDL -- -- --  CHOL -- -- --   Estimated Creatinine Clearance: 29.3 ml/min (by C-G formula based on Cr of 1.12).   No results found for this basename: GLUCAP:3 in the last 72 hours  Insulin Requirements in the past 12 hours:  None. CBGs and SSI d/c'd  Current Nutrition:  PO: tolerating full liquid diet, advance to heart healthy diet  Nutritional Goals:  1200-1300 kCal, 55-65 grams of protein per day; 1.5L/d  Assessment: Patient with persistent SBO, for non-operative management. NGT d/c 1/29.  GI SBO. recurrent N/V and distention when diet advanced to solids. Non-surg mngmnt;advanced to heart healthy diet  Endo Glucose controlled on BMET  Lytes/Renal Scr  trending up, good UOP Hypokalemia supplemented per MD  Hepatobil LFTs WNL, chol WNL 101, trig slightly elevated 172 with hx HLD. Prealbumin 13.5 slightly below goal 18-40; likely d/t inflammation and low PO intake  Px po PPI, LMWH   Plan:  1. Continue Clinimix E 5/15 at 25 ml/hr 2. MWF supplementation of MVI, TE and lipids (60ml/hr) d/t Publishing copy. 3. F/U PO intake to wean off TPN tomorrow 4. F/U AM labs   Phillips Climes, PharmD 05/18/2011 10:35 AM

## 2011-05-19 LAB — BASIC METABOLIC PANEL
CO2: 23 mEq/L (ref 19–32)
Calcium: 8.8 mg/dL (ref 8.4–10.5)
Creatinine, Ser: 1.08 mg/dL (ref 0.50–1.10)
Glucose, Bld: 113 mg/dL — ABNORMAL HIGH (ref 70–99)

## 2011-05-19 MED ORDER — HEPARIN SOD (PORK) LOCK FLUSH 100 UNIT/ML IV SOLN
250.0000 [IU] | Freq: Every day | INTRAVENOUS | Status: DC
  Filled 2011-05-19: qty 3

## 2011-05-19 MED ORDER — TADALAFIL (PAH) 20 MG PO TABS
20.0000 mg | ORAL_TABLET | Freq: Every day | ORAL | Status: DC
  Administered 2011-05-19: 20 mg via ORAL
  Filled 2011-05-19: qty 1

## 2011-05-19 MED ORDER — HEPARIN SOD (PORK) LOCK FLUSH 100 UNIT/ML IV SOLN
250.0000 [IU] | INTRAVENOUS | Status: DC | PRN
  Administered 2011-05-19: 250 [IU]
  Filled 2011-05-19: qty 3

## 2011-05-19 NOTE — Progress Notes (Signed)
   CARE MANAGEMENT NOTE 05/19/2011  Patient:  Kelly Hart, Kelly Hart   Account Number:  [id number]  Date Initiated:  05/07/2011  Documentation initiated by:  Junius Creamer  Subjective/Objective Assessment:   adm w chf     Action/Plan:   lives alone, good fam support, pcp dr Lia Hopping   Anticipated DC Date:  05/20/2011   Anticipated DC Plan:  HOME W HOME HEALTH SERVICES      DC Planning Services  CM consult      [facility] Choice  Resumption Of Svcs/PTA Provider   Choice offered to / List presented to:          [facility] arranged  HH-1 RN  HH-10 DISEASE MANAGEMENT      HH agency  [facility]   Status of service:  Completed, signed off Medicare Important Message given?   (If response is "NO", the following Medicare IM given date fields will be blank) Date Medicare IM given:   Date Additional Medicare IM given:    Discharge Disposition:  HOME W HOME HEALTH SERVICES  Per UR Regulation:  Reviewed for med. necessity/level of care/duration of stay  Comments:  05/19/2011 1300 [facility] aware of pt's scheduled d/c home today with Milrinone. Daughter to bring medication and HH RN will set up at 2:30 pm today in pt's room. Isidoro Donning RN CCM Case Mgmt phone [phone]  2/1 have alerted marie w [facility] may be dc this weekend on her iv milrnone gtt. debbie dowell rn,bsn 05/15/11 1400 Henrietta Mayo RN MSN CCM Pt to d/c to daughter's home.  [facility] will resume following Milrinone drip - will need order to resume Milrinone @ previous dosage.

## 2011-05-19 NOTE — Discharge Summary (Signed)
Physician Discharge Summary  Patient ID: ORAL HALLGREN MRN: [id number] DOB/AGE: June 19, 1932 78 y.o.  Admit date: 05/04/2011 Discharge date: 05/19/2011  Discharge Diagnoses:  Principal Problem:  *SBO (small bowel obstruction) Active Problems:  Pulmonary hypertension  RHF (right heart failure)  Anemia  Emphysema  Hypothyroidism   Discharged Condition: fair  Initial History: Kelly Hart is an 78 y.o. female who presented with complaints of nausea and vomiting and generalized ABD Pain X 1 week. She denied hematemesis, and reported not being able to hold down foods or liquids. She reported passing loose stools. She denied having fevers or chills. In the ED a CT scan of the ABD and pelvis revealed a Small Bowel Obstruction and patient was referred for admission.   Hospital Course:  1. SBO: This was most likely due to adhesions. Patient had an NG tube placed initially. She was followed by general surgery. Patient slowly improved. The NG tube was subsequently removed on the 29th. Diet was slowly advanced and since yesterday she has been for tolerating solid diet without any difficulties. While she had the NG tube and was NPO she was put on TPN for nutritional purposes. The TPN has been tapered down and will be discontinued today. There is no indication for followup with surgery at this time.    2. Pulmonary hypertension and RHF (right heart failure): She was on Milrinone infusion at home. This was continued in the hospital. Dr. Gala Romney and the heart failure team has been following the patient. Home health will resume milrinone infusion at home later today.    3. Left hydronephrosis: During the evaluation for her abdominal pain patient underwent a CT scan of her abdomen, which incidentally revealed a left hydronephrosis. It was felt that this was due to the graft crossing over the ureter on the left side. Patient has been making urine. She has close to normal renal function. There was no  urgent indication for an urological consultation during this hospitalization. An outpatient consultation is recommended. I have provided the patient with the phone number for Dr. Lynnae Sandhoff. She may also discuss this issue with her PCP.  4. Difficulty of extension of Left ankle: Possibly secondary to arthritis. X ray left ankle showed no fracture. She was seen by PT, no followup recommended. She has been able to ambulate.  5. Severe malnutrition: While she was kept n.p.o. patient was put on TPN. Since her by mouth intake is now back to baseline. This will be discontinued.   6. Emphysema/COPD: Quit nicotine 4 months ago. Her pulmonary status continues to be stable.  7. Hypothyroidism: On levothyroxine. This is stable.   PERTINENT LABS  Hemoglobin at the time of admission was 10.2. She did have some electrolyte abnormalities including hypokalemia during this hospitalization, which were all corrected.  IMAGING STUDIES Ct Abdomen Pelvis Wo Contrast  05/04/2011  *RADIOLOGY REPORT*  Clinical Data: Nausea, abdominal pain, poor PO intake  CT ABDOMEN AND PELVIS WITHOUT CONTRAST  Technique:  Multidetector CT imaging of the abdomen and pelvis was performed following the standard protocol without intravenous contrast.  Comparison: [facility] CT abdomen/pelvis dated 01/17/2009  Findings: Chronic interstitial markings/subpleural reticulation at the lung bases.  Unenhanced liver, spleen, pancreas, and adrenal glands are within normal limits.  Status post cholecystectomy.  No intrahepatic ductal dilatation. Stable common bile duct.  Right kidney is unremarkable.  Moderate left hydronephrosis.  No renal calculi.  Multiple dilated loops of proximal small bowel with abrupt narrowing/transition in the right mid abdomen (series 2/image  40). Distal small bowel/colon are decompressed.  This appearance is compatible with high-grade partial or complete small bowel obstruction.  Colonic diverticulosis, without  associated inflammatory changes.  Atherosclerotic calcifications of the abdominal aorta and branch vessels.  Aortobifemoral bypass.  No abdominopelvic ascites.  No suspicious abdominopelvic lymphadenopathy.  Uterus is notable for calcification of the periuterine vessels.  No adnexal masses.  Proximal left hydroureter, which it abruptly narrows at the level of the crossing left femoral bypass graft (series 2/image 48).  No bladder calculi.  Mild degenerative changes of the visualized thoracolumbar spine. Sclerosis from prior bilateral sacral insufficiency fractures (series 2/image 55).  IMPRESSION: High-grade partial or complete small bowel obstruction with transition in the right mid abdomen.  Moderate left hydronephrosis with proximal left hydroureter, likely on the basis of extrinsic compression secondary to crossing left femoral bypass graft.  Additional ancillary findings as above.  Original Report Authenticated By: Charline Bills, [name]   Dg Ankle 2 Views Left  05/14/2011  *RADIOLOGY REPORT*  Clinical Data: Left ankle pain.  LEFT ANKLE - 2 VIEW  Comparison: None  Findings: There is no evidence of fracture, subluxation or dislocation. Mild degenerative changes along the medial tibiotalar joint noted. No focal bony lesions are present. There is no evidence of ankle effusion.  IMPRESSION: No evidence of acute abnormality.  Mild tibiotalar joint degenerative changes.  Original Report Authenticated By: Rosendo Gros, [name]   Dg Abd 2 Views  05/15/2011  *RADIOLOGY REPORT*  Clinical Data: Small bowel obstruction  ABDOMEN - 2 VIEW  Comparison: Plain films 05/13/2011  Findings: NG tube is unchanged.  Dilated loops of small bowel measure up to 2.8 cm which is improved compared to prior.  No intraperitoneal free air is evident on the left lateral decubitus view.  There is gas within the rectum.  IMPRESSION: Improving small bowel obstruction pattern.  Original Report Authenticated By: Genevive Bi, [name]   Dg Abd  2 Views  05/13/2011  *RADIOLOGY REPORT*  Clinical Data: Small bowel obstruction  ABDOMEN - 2 VIEW  Comparison: 05/12/2011; 05/10/2011; CT of the abdomen and pelvis - 05/04/2011  Findings:  There is persistent mild gaseous distension of a loop of featureless loop of bowel with the right mid hemiabdomen, measuring approximately 3.9 cm in diameter.  There is likely a minimal amount of distal colonic gas.  Interval advancement of enteric tube with tip now overlying the expected location of the mid body of the stomach.  No definite air-fluid levels seen on the provided decubitus radiograph.  No definite pneumoperitoneum.  Post cholecystectomy.  Vascular calcifications.  Lumbar spine degenerative change. Surgical clips overlying the lower lumbar spine and bilateral groins likely sequela of previous identified aortobifemoral bypass.  IMPRESSION:  1. No definite evidence of obstruction.  2.  Interval advancement of enteric tube with tip now overlying the expected location of the mid body of the stomach.  Original Report Authenticated By: Waynard Reeds, [name]   Dg Abd 2 Views  05/12/2011  *RADIOLOGY REPORT*  Clinical Data: Abdominal pain.  Follow-up small bowel obstruction.  ABDOMEN - 2 VIEW  Comparison: 05/10/2011 and 05/08/2011.  Findings: Nasogastric tube tip is in the distal esophagus and should be advanced.  Small bowel distension appears slightly improved.  There is a residual dilated loop in the left abdomen measuring 4.2 cm in diameter.  Decubitus view demonstrates scattered air-fluid levels and no pneumoperitoneum. Cholecystectomy clips and diverticula at the splenic flexure are noted.  IMPRESSION:  1.  Slight improvement in  small bowel obstruction pattern.  No evidence of pneumoperitoneum. 2.  The tip of the nasogastric tube is in the distal esophagus and should be advanced.  Original Report Authenticated By: Gerrianne Scale, [name]   Dg Abd 2 Views  05/10/2011  *RADIOLOGY REPORT*  Clinical Data: Resolution  of small bowel obstruction.  ABDOMEN - 2 VIEW  Comparison: 05/08/2011.  Findings: Compared yesterday's exam, there is more dilation of small bowel loops suggesting progression of small bowel obstruction which may be intermittent.  The nasogastric tube has been removed. Cholecystectomy clips are present in the right upper quadrant. Atherosclerosis is noted.  Stool and bowel gas are present in the rectosigmoid. Compared yesterday's exam, there has been distal progression of colonic contrast and gas and fluid are present within the proximal colon.  IMPRESSION:  1.  Interval removal of nasogastric tube. 2.  Progressive distention of small bowel loops suggesting persistent partial small bowel obstruction or intermittent obstruction.  Original Report Authenticated By: Andreas Newport, [name]   Dg Abd 2 Views  05/08/2011  *RADIOLOGY REPORT*  Clinical Data: Follow up small bowel obstruction pattern  ABDOMEN - 2 VIEW  Comparison: 05/07/2011  Findings: The nasogastric tube tip is in the stomach with side port below GE junction.  Barium is again noted within the left colon and rectum.  No air-fluid levels identified.  Abnormal small bowel dilatation is stable to improved in the interval.  IMPRESSION:  1.  Stable to improved partial small bowel obstruction.  Original Report Authenticated By: [name], [name]   Dg Abd 2 Views  05/07/2011  *RADIOLOGY REPORT*  Clinical Data: Small bowel obstruction.  ABDOMEN - 2 VIEW  Comparison: Radiographs dated 05/06/2011 and CT scan dated 05/04/2011  Findings: NG tube tip is in the distal stomach.  There are persistent dilated loops of proximal small bowel without appreciable change since the prior exam of 05/06/2011.  Contrast from the prior CT scan has passed into the nondistended colon indicating that the obstruction is only partial.  No free air.  Chronic interstitial lung disease.  IMPRESSION: Persistent partial small bowel obstruction, essentially unchanged since 05/06/2011   Original Report Authenticated By: [name], [name]   Dg Abd 2 Views  05/06/2011  *RADIOLOGY REPORT*  Clinical Data: Nausea, small bowel obstruction  ABDOMEN - 2 VIEW  Comparison: CT abdomen pelvis dated 05/04/2011  Findings: Dilated loops of small bowel in the lower abdomen. Residual contrast within decompressed loops of colon.  These findings suggest high-grade partial small bowel obstruction.  Colonic diverticulosis.  No evidence of free air on the lateral decubitus view.  Cholecystectomy clips.  Degenerative changes of the visualized thoracolumbar spine.  IMPRESSION: Dilated loops of small bowel with residual contrast within decompressed loops of colon, suggesting high-grade partial small bowel obstruction.  No free air.  Original Report Authenticated By: Charline Bills, [name]    Discharge Exam: Blood pressure 138/70, pulse 86, temperature 97 F (36.1 C), temperature source Oral, resp. rate 20, height 5\' 4"  (1.626 m), weight 43.9 kg (96 lb 12.5 oz), SpO2 99.00%. General appearance: alert, cooperative, appears stated age and no distress Resp: clear to auscultation bilaterally Cardio: regular rate and rhythm, S1, S2 normal, no murmur, click, rub or gallop GI: soft, non-tender; bowel sounds normal; no masses,  no organomegaly  Disposition: Home or Self Care  Discharge Orders    Future Orders Please Complete By Expires   Diet - low sodium heart healthy      Increase activity slowly  Current Discharge Medication List    CONTINUE these medications which have NOT CHANGED   Details  amiodarone (PACERONE) 200 MG tablet Take 200 mg by mouth daily.    aspirin EC 81 MG tablet Take 81 mg by mouth daily.      atorvastatin (LIPITOR) 40 MG tablet Take 40 mg by mouth daily.    digoxin (LANOXIN) 0.125 MG tablet Take 0.0625 mcg by mouth daily.     levothyroxine (SYNTHROID, LEVOTHROID) 100 MCG tablet Take 100 mcg by mouth daily.    magnesium oxide (MAG-OX) 400 MG tablet Take 400 mg by  mouth daily.    milrinone (PRIMACOR) 200-5 MCG/ML-% infusion Inject 0.125 mcg/kg/min into the vein continuous.    Multiple Vitamins-Minerals (MULTIVITAMINS THER. W/MINERALS) TABS Take 1 tablet by mouth daily.      pantoprazole (PROTONIX) 40 MG tablet Take 40 mg by mouth daily.    Tadalafil, PAH, 20 MG TABS Take 20 mg by mouth daily.    traMADol (ULTRAM) 50 MG tablet Take 50-100 mg by mouth every 6 (six) hours as needed. Maximum dose= 8 tablets per day, for pain     zolpidem (AMBIEN) 5 MG tablet Take 5 mg by mouth at bedtime as needed. For insomnia       Follow-up Information    Follow up with HASANAJ,XAJE A, MD. Schedule an appointment as soon as possible for a visit in 1 week. (post hospitalization follow up)       Follow up with DAHLSTEDT, Bertram Millard, MD. Schedule an appointment as soon as possible for a visit in 2 weeks. (to discuss left hydronephrosis (swelling of left kidney) detected on CT)    Contact information:   [address] [place] [address] [phone]          Total Discharge Time: 35 mins  [facility]  [facility] Pager [phone]  05/19/2011, 10:39 AM

## 2011-05-24 ENCOUNTER — Encounter: Payer: Self-pay | Admitting: Internal Medicine

## 2011-05-31 ENCOUNTER — Encounter: Payer: Self-pay | Admitting: Internal Medicine

## 2011-06-05 ENCOUNTER — Ambulatory Visit (HOSPITAL_COMMUNITY)
Admission: RE | Admit: 2011-06-05 | Discharge: 2011-06-05 | Disposition: A | Discharge status: Home / Self Care | Payer: PRIVATE HEALTH INSURANCE | Source: Ambulatory Visit | Attending: Internal Medicine | Admitting: Internal Medicine

## 2011-06-05 VITALS — BP 138/66 | HR 100 | Wt 98.5 lb

## 2011-06-05 DIAGNOSIS — I5030 Unspecified diastolic (congestive) heart failure: Secondary | ICD-10-CM | POA: Insufficient documentation

## 2011-06-05 DIAGNOSIS — I50810 Right heart failure, unspecified: Secondary | ICD-10-CM

## 2011-06-05 DIAGNOSIS — R110 Nausea: Secondary | ICD-10-CM | POA: Insufficient documentation

## 2011-06-05 DIAGNOSIS — I509 Heart failure, unspecified: Secondary | ICD-10-CM

## 2011-06-05 MED ORDER — AMIODARONE HCL 100 MG PO TABS: 100.0000 mg | ORAL_TABLET | Freq: Every day | ORAL | Status: DC

## 2011-06-05 NOTE — Assessment & Plan Note (Addendum)
Patient seen and examined with Ulyess Blossom PA-C. We discussed all aspects of the encounter. I agree with the assessment and plan as stated above.   Doing quite well on milrinone/Adcirca. Still NYHA III-IIIb. Though she does have more energy. No volume overload on exam. Continue current therapy. Recent labs reviewed from 2/14 and look good. She is thinking of going back to [place] and we have contacted or SW/CM folks to discuss the process of switching her Home Health agency and whether she needs to have a [place] doc prescribe the milrinone.  Total time spent 50 mins discussing with her and her family and coordinating care.

## 2011-06-05 NOTE — Patient Instructions (Signed)
Decrease amiodarone 100 mg daily.    Follow up in 4 weeks.

## 2011-06-05 NOTE — Progress Notes (Signed)
HPI: 78 year old AA female with of pulseless electrical activity arrest, right heart failure secondary to cor pulmonale,  pulmonary arterial hypertension, Acute-on-chronic respiratory failure, multifocal atrial tachycardia, peripheral arterial disease status post aortobifem bypass with acute occlusion of the right limb of the graft s/p thrombectomy by Dr. Arbie Cookey.    02/08/2011 Cardiac catheterization showed minimal coronary artery disease with 30% in the right coronary artery and 40%in the distal PDA. However, right atrial pressure, we had a mean of 16, a PA pressure of  61/30 with an EDP of 40.  Pulmonary capillary wedge pressure in the right lower lobe pulmonary artery showed a wedge of 30.  LV pressure was 101/9 with an EDP of 13.  There was significant wedged LVEDP gradient concerning for mitral stenosis.  Her Fick cardiac output was 2.0 and cardiac index was 1.4, this was on low-dose dopamine.  Her PA  saturations at that time were 39%.   12/6 magnesium 1.5 started Magnesium last week.  12/20 Mg 1.7, Mg Ox 400 daily   She returns for post hospital follow up today.  She was recently admitted for a small bowel obstruction that cleared with NG tube placement and we were able to withhold from surgery.  She continues on milrinone 0.25 mcg/kg/min and adcirca 20 mg.  No orthopnea/PND.  Dyspnea with ambulation does not appear to be worse but has increased.  No lower extremity edema.  No dizziness.  Continues to be nauseas about once a week.  Regular BMs.  No vomiting.  She wears her O2 most times when she ambulates, does not notice O2 falling below 90.       She continues to live with her daughter.  A RN with [facility] comes every Thursday to change PICC line dressing, site looks good.     ROS: All systems negative except as listed in HPI, PMH and Problem List.  Past Medical History  Diagnosis Date  . Hypertension   . Renal insufficiency     baseline around cr 1.3  . Atrial fibrillation   . COPD (chronic  obstructive pulmonary disease)   . CHF (congestive heart failure)     diastolic  . PEA (Pulseless electrical activity) Oct 2012  . PAD (peripheral artery disease)   . Hyperlipidemia   . Hypothyroidism   . History of cervical cancer   . Chronic diarrhea     secondary to treatment of her cervical cancer  . Hydronephrosis, left   . Pulmonary hypertension   . GERD (gastroesophageal reflux disease)   . Myocardial infarction     Current Outpatient Prescriptions  Medication Sig Dispense Refill  . amiodarone (PACERONE) 200 MG tablet Take 200 mg by mouth daily.      Marland Kitchen aspirin EC 81 MG tablet Take 81 mg by mouth daily.        Marland Kitchen atorvastatin (LIPITOR) 40 MG tablet Take 40 mg by mouth daily.      . digoxin (LANOXIN) 0.125 MG tablet Take 0.0625 mcg by mouth daily.       Marland Kitchen levothyroxine (SYNTHROID, LEVOTHROID) 100 MCG tablet Take 100 mcg by mouth daily.      . magnesium oxide (MAG-OX) 400 MG tablet Take 400 mg by mouth daily.      . milrinone (PRIMACOR) 200-5 MCG/ML-% infusion Inject 0.125 mcg/kg/min into the vein continuous.      . Multiple Vitamins-Minerals (MULTIVITAMINS THER. W/MINERALS) TABS Take 1 tablet by mouth daily.        . pantoprazole (PROTONIX) 40  MG tablet Take 40 mg by mouth daily.      . Tadalafil, PAH, 20 MG TABS Take 20 mg by mouth daily.      . traMADol (ULTRAM) 50 MG tablet Take 50-100 mg by mouth every 6 (six) hours as needed. Maximum dose= 8 tablets per day, for pain       . zolpidem (AMBIEN) 5 MG tablet Take 5 mg by mouth at bedtime as needed. For insomnia         PHYSICAL EXAM: Filed Vitals:   06/05/11 1102  BP: 138/66  Pulse: 100  Weight: 98 lb 8 oz (44.679 kg)  SpO2: 97%    General: Thin appearing. No resp difficulty wearing O2 HEENT: normal Neck: supple. JVP 6-7  Carotids 2+ bilaterally; no bruits. No lymphadenopathy or thryomegaly appreciated. Cor: PMI normal.Tachycardiac Regular rate & rhythm. No rubs, gallops or   AM 2/6  Lungs: clear 2 liters nasal  cannula Abdomen: soft, nontender, nondistended. No hepatosplenomegaly. No bruits or masses. Good bowel sounds. Extremities: no cyanosis, clubbing, rash, edema. RUE PICC Neuro: alert & orientedx3, cranial nerves grossly intact. Moves all 4 extremities w/o difficulty. Affect pleasant.    ASSESSMENT & PLAN:

## 2011-06-05 NOTE — Assessment & Plan Note (Signed)
Unclear etiology. Will decrease amio to 100 daily and have her take with food. And check KUB.

## 2011-06-05 NOTE — Assessment & Plan Note (Signed)
Continue with home O2 

## 2011-06-07 ENCOUNTER — Telehealth (HOSPITAL_COMMUNITY): Payer: Self-pay | Admitting: Physician Assistant

## 2011-06-07 NOTE — Telephone Encounter (Signed)
Called [name] because Digoxin level came back at 1.0 ng/ml.  Will have patient take digoxin 0.0625 Monday through Friday and hold on Saturday and Sundays.  Recheck in 2 weeks.  She is agreeable to this.

## 2011-06-09 NOTE — Assessment & Plan Note (Signed)
Multifactorial. Felt to be mostly due to longstanding pulmonary venous HTN and COPD. Doing well with milrinone and tadalafil.

## 2011-06-20 ENCOUNTER — Telehealth (HOSPITAL_COMMUNITY): Payer: Self-pay | Admitting: *Deleted

## 2011-06-20 NOTE — Telephone Encounter (Signed)
Pt is now moving to [facility] and [facility] will be taking care of her PICC and Milrinone, Maureen Ralphs wanted to be sure that Dr Gala Romney will sign her orders, gave her ok for that she will fax fomrs, and she states they will be seeing pt on Friday to admit her to there service

## 2011-06-25 ENCOUNTER — Telehealth (HOSPITAL_COMMUNITY): Payer: Self-pay | Admitting: *Deleted

## 2011-06-25 NOTE — Telephone Encounter (Signed)
[name] from [facility] services called today in regards to Ms Doscher.  Her heart rate today was 114, with no remarkable changes, lungs are clear.  She does has increased SOB, on 3 liters sats 96%.  [name] has gone ahead and called Dr Nedra Hai in [place] to schedule a cardiology appt there.  He wanted [place] to be aware of his assessment today.

## 2011-06-25 NOTE — Telephone Encounter (Signed)
Discussed current assessment. O2 sats > 96%. SOB on exertion. Mrs Kelly Hart does have appointment with Dr Nedra Hai July 18, 2011. [name] encouraged to call back if he has additional questions. I thanked him for the update.

## 2011-06-28 ENCOUNTER — Encounter: Payer: Self-pay | Admitting: Internal Medicine

## 2011-06-28 ENCOUNTER — Telehealth (HOSPITAL_COMMUNITY): Payer: Self-pay | Admitting: *Deleted

## 2011-06-28 NOTE — Telephone Encounter (Signed)
Kelly Hart called today to let us know that one of her lines on  Her triple lumen will not draw back.  It will flush ok and so do the others. They just want you to be aware in case you want to do something about it.  Thanks.

## 2011-06-28 NOTE — Telephone Encounter (Signed)
Continue to monitor as line still flushing ok.  Other 2 lines ok.

## 2011-06-29 ENCOUNTER — Telehealth (HOSPITAL_COMMUNITY): Payer: Self-pay | Admitting: *Deleted

## 2011-06-29 NOTE — Telephone Encounter (Signed)
Kelly Hart called today in regards to [name].  She wants [place] to be aware that her heart rate has been consistently over 100 and at times above 120.  He would also like parameters regarding her heart rate and O2 sats (HR >120, sats <88%), due to her disease process.  Please return his call.  Thanks.

## 2011-06-29 NOTE — Telephone Encounter (Signed)
Left message to return call. Asked [name] to call for HR above 120 and O2 sats less than 88.

## 2011-07-05 ENCOUNTER — Encounter (HOSPITAL_COMMUNITY): Payer: PRIVATE HEALTH INSURANCE

## 2011-07-06 ENCOUNTER — Encounter: Payer: Self-pay | Admitting: Internal Medicine

## 2011-07-09 ENCOUNTER — Encounter (HOSPITAL_COMMUNITY): Payer: Self-pay

## 2011-07-09 ENCOUNTER — Ambulatory Visit (HOSPITAL_COMMUNITY)
Admission: RE | Admit: 2011-07-09 | Discharge: 2011-07-09 | Disposition: A | Discharge status: Home / Self Care | Payer: PRIVATE HEALTH INSURANCE | Source: Ambulatory Visit | Attending: Internal Medicine | Admitting: Internal Medicine

## 2011-07-09 VITALS — BP 108/46 | HR 112 | Wt 102.5 lb

## 2011-07-09 DIAGNOSIS — I50810 Right heart failure, unspecified: Secondary | ICD-10-CM

## 2011-07-09 DIAGNOSIS — I509 Heart failure, unspecified: Secondary | ICD-10-CM

## 2011-07-09 NOTE — Patient Instructions (Signed)
Your physician recommends that you schedule a follow-up appointment in: 2 months  

## 2011-07-09 NOTE — Progress Notes (Signed)
HPI:  Kelly Hart is a 78 year old AA female with of pulseless electrical activity arrest, right heart failure secondary to cor pulmonale,  pulmonary arterial hypertension, Acute-on-chronic respiratory failure, multifocal atrial tachycardia, peripheral arterial disease status post aortobifem bypass with acute occlusion of the right limb of the graft s/p thrombectomy by Dr. Arbie Cookey.    02/08/2011 Cardiac catheterization showed minimal coronary artery disease with 30% in the right coronary artery and 40%in the distal PDA. However, right atrial pressure, we had a mean of 16, a PA pressure of  61/30 with an EDP of 40.  Pulmonary capillary wedge pressure in the right lower lobe pulmonary artery showed a wedge of 30.  LV pressure was 101/9 with an EDP of 13.  There was significant wedged LVEDP gradient concerning for mitral stenosis.  Her Fick cardiac output was 2.0 and cardiac index was 1.4, this was on low-dose dopamine.  Her PA  saturations at that time were 39%.   12/6 magnesium 1.5 started Magnesium last week.  12/20 Mg 1.7, Mg Ox 400 daily   In January 2013 she was admitted for a small bowel obstruction that cleared with NG tube placement and we were able to withhold from surgery.  She continues on milrinone 0.25 mcg/kg/min and adcirca 20 mg.  No orthopnea/PND/edema/syncope.  Main complain is dyspnea with ambulation does not appear to be worse.  No lower extremity edema.  No dizziness. She wears her O2 most times when she ambulates, O2 went down on one occasion to 78%. Weight now up to 102.   Back home living in [place]. PCP put her on Megace.  A RN comes every Thursday to change PICC line dressing, site looks good.     ROS: All systems negative except as listed in HPI, PMH and Problem List.  Past Medical History  Diagnosis Date  . Hypertension   . Renal insufficiency     baseline around cr 1.3  . Atrial fibrillation   . COPD (chronic obstructive pulmonary disease)   . CHF (congestive heart failure)    diastolic  . PEA (Pulseless electrical activity) Oct 2012  . PAD (peripheral artery disease)   . Hyperlipidemia   . Hypothyroidism   . History of cervical cancer   . Chronic diarrhea     secondary to treatment of her cervical cancer  . Hydronephrosis, left   . Pulmonary hypertension   . GERD (gastroesophageal reflux disease)   . Myocardial infarction     Current Outpatient Prescriptions  Medication Sig Dispense Refill  . amiodarone (PACERONE) 100 MG tablet Take 1 tablet (100 mg total) by mouth daily.  30 tablet  6  . aspirin EC 81 MG tablet Take 81 mg by mouth daily.        Marland Kitchen atorvastatin (LIPITOR) 40 MG tablet Take 40 mg by mouth daily.      . digoxin (LANOXIN) 0.125 MG tablet Take 0.0625 mcg by mouth daily.       . diphenhydrAMINE (BENADRYL) 25 mg capsule Take 25 mg by mouth at bedtime as needed.      Marland Kitchen levothyroxine (SYNTHROID, LEVOTHROID) 100 MCG tablet Take 100 mcg by mouth daily.      . magnesium oxide (MAG-OX) 400 MG tablet Take 400 mg by mouth daily.      . megestrol (MEGACE ES) 625 MG/5ML suspension Take 625 mg by mouth daily.      . milrinone (PRIMACOR) 200-5 MCG/ML-% infusion Inject 0.125 mcg/kg/min into the vein continuous.      Marland Kitchen  Multiple Vitamins-Minerals (MULTIVITAMINS THER. W/MINERALS) TABS Take 1 tablet by mouth daily.        . pantoprazole (PROTONIX) 40 MG tablet Take 40 mg by mouth daily.      . Tadalafil, PAH, 20 MG TABS Take 20 mg by mouth daily.         PHYSICAL EXAM: Filed Vitals:   07/09/11 1131  BP: 108/46  Pulse: 112  Weight: 102 lb 8 oz (46.494 kg)  SpO2: 89%    General: Thin appearing. No resp difficulty wearing O2. Sitting in W-C on O2 HEENT: normal Neck: supple. JVP 6-7  Carotids 2+ bilaterally; no bruits. No lymphadenopathy or thryomegaly appreciated. Cor: PMI normal.Tachycardiac Regular rate & rhythm. 2/6 SEM at RSB. +prominent s2. +RV lift Lungs: clear with decreased BS throughout. No wheezes.   Abdomen: soft, nontender, nondistended. No  hepatosplenomegaly. No bruits or masses. Good bowel sounds. Extremities: no cyanosis, clubbing, rash, edema. RUE PICC Neuro: alert & orientedx3, cranial nerves grossly intact. Moves all 4 extremities w/o difficulty. Affect pleasant.    ASSESSMENT & PLAN:

## 2011-07-09 NOTE — Progress Notes (Signed)
Encounter addended by: Noralee Space, RN on: 07/09/2011 12:30 PM<BR>     Documentation filed: Visit Diagnoses, Patient Instructions Section

## 2011-07-09 NOTE — Assessment & Plan Note (Signed)
Continues to do surprisingly well from RV standpoint. Will continue milrinone and AdCirca. Volume status looks good. 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 Will refer to Dr. Orson Aloe for optimization of her COPD regimen. F/u 2 months.

## 2011-07-10 ENCOUNTER — Telehealth (HOSPITAL_COMMUNITY): Payer: Self-pay | Admitting: *Deleted

## 2011-07-10 NOTE — Telephone Encounter (Signed)
Kelly Hart called today in regards to her mom.  She attempted to call Dr Henderson's office to set up an appointment for her mom, and was told they could not see her until May.  She asked me to call and see if I could get a sooner appointment.  I called and was told the earliest they could do would be June.  She wants to know if there is another pulmonary doctor you could refer her to, that maybe able to get her mom in sooner.  Thanks.

## 2011-07-12 ENCOUNTER — Encounter: Payer: Self-pay | Admitting: Internal Medicine

## 2011-07-12 NOTE — Telephone Encounter (Signed)
Would arrange f/u with Dr. Delton Coombes or Vassie Loll.

## 2011-07-12 NOTE — Telephone Encounter (Signed)
Dawn, can you please make referral

## 2011-07-14 ENCOUNTER — Encounter: Payer: Self-pay | Admitting: Internal Medicine

## 2011-07-19 ENCOUNTER — Telehealth (HOSPITAL_COMMUNITY): Payer: Self-pay | Admitting: *Deleted

## 2011-07-19 NOTE — Telephone Encounter (Signed)
Spoke w/[name] she is requesting a note that states we feel someone should be with pt overnight, will send to Dr Gala Romney

## 2011-07-19 NOTE — Telephone Encounter (Signed)
[name] called today in regards to her mom.  She needs a letter from Dr Gala Romney stating that it is ok for her family to stay overnight with her.  The office manager, Norm Parcel, from [facility] is requesting the letter.  She would like it faxed to [phone].  Thank you.

## 2011-07-20 ENCOUNTER — Encounter: Payer: Self-pay | Admitting: Internal Medicine

## 2011-07-25 ENCOUNTER — Encounter (HOSPITAL_COMMUNITY): Payer: Self-pay | Admitting: Internal Medicine

## 2011-07-25 NOTE — Telephone Encounter (Signed)
Letter completed by Dr Gala Romney and faxed

## 2011-07-26 ENCOUNTER — Encounter (HOSPITAL_COMMUNITY): Payer: Self-pay

## 2011-07-27 ENCOUNTER — Encounter: Payer: Self-pay | Admitting: Internal Medicine

## 2011-08-03 ENCOUNTER — Encounter: Payer: Self-pay | Admitting: Internal Medicine

## 2011-08-08 ENCOUNTER — Telehealth (HOSPITAL_COMMUNITY): Payer: Self-pay | Admitting: *Deleted

## 2011-08-08 NOTE — Telephone Encounter (Signed)
Spoke w/pt's daughter she states since Sat pt's BP has been 160-190s, her wt is stable at 100 lb she is feeling ok, per Dr Gala Romney monitor for a few more days and if continues to be elevated then can start Norvasc 2.5 mg daily, pt's daughter aware I will call her Friday AM for update

## 2011-08-08 NOTE — Telephone Encounter (Signed)
Kelly Hart called today in regards to her mom.  Kelly Hart's B/P is running high steady since 195/9?, 165/90.  Her weight is 100.3.  Kelly Hart would like a call back. Thanks.

## 2011-08-09 ENCOUNTER — Telehealth (HOSPITAL_COMMUNITY): Payer: Self-pay | Admitting: *Deleted

## 2011-08-09 NOTE — Telephone Encounter (Signed)
Left message to call back  

## 2011-08-09 NOTE — Telephone Encounter (Signed)
[name] called today to tell us that he cannot remove the red hub on Kelly Hart's picc line.  He says it is on too tight.  Please follow up. Thanks.

## 2011-08-10 NOTE — Telephone Encounter (Signed)
Spoke w/[name] this AM she states BP has come down and was 160 yesterday she did discover that pt was very upset lately due to family issues and she has resolved that and pt seems be calming down, she feels this could be the reason for elevated BP, will continue to monitor and touch base early next week.  Also pt lab results show k 3.2 per Ulyess Blossom, PA have pt take 60 meq of kcl today, [name] is aware and will give her dose.  Labs will be rechecked next week by Home Health again.

## 2011-08-13 ENCOUNTER — Telehealth (HOSPITAL_COMMUNITY): Payer: Self-pay | Admitting: Adult Health

## 2011-08-13 ENCOUNTER — Encounter (HOSPITAL_COMMUNITY): Payer: Self-pay

## 2011-08-13 NOTE — Telephone Encounter (Signed)
I have asked [name] to call back tomorrow regarding Kelly Hart blood pressure. If blood pressure remains elevated will start Norvasc 2.5 mg daily. [name] verbalized understanding.

## 2011-08-13 NOTE — Telephone Encounter (Signed)
Have not heard back from [facility]

## 2011-08-14 ENCOUNTER — Other Ambulatory Visit: Payer: Medicare Other

## 2011-08-14 ENCOUNTER — Ambulatory Visit (INDEPENDENT_AMBULATORY_CARE_PROVIDER_SITE_OTHER): Payer: Medicare Other | Admitting: Emergency Medicine

## 2011-08-14 ENCOUNTER — Encounter: Payer: Self-pay | Admitting: Emergency Medicine

## 2011-08-14 VITALS — BP 104/62 | HR 108 | Temp 97.7°F | Ht 64.0 in | Wt 105.4 lb

## 2011-08-14 DIAGNOSIS — I2789 Other specified pulmonary heart diseases: Secondary | ICD-10-CM

## 2011-08-14 DIAGNOSIS — J449 Chronic obstructive pulmonary disease, unspecified: Secondary | ICD-10-CM

## 2011-08-14 DIAGNOSIS — I272 Pulmonary hypertension, unspecified: Secondary | ICD-10-CM

## 2011-08-14 NOTE — Progress Notes (Signed)
  Subjective:    Patient ID: Kelly Hart, female    DOB: March 16, 1933, 78 y.o.   MRN: [id number]  HPI    Review of Systems  Constitutional: Positive for unexpected weight change. Negative for fever.  HENT: Negative.  Negative for ear pain, nosebleeds, congestion, sore throat, rhinorrhea, sneezing, trouble swallowing, dental problem, postnasal drip and sinus pressure.   Eyes: Negative.  Negative for redness and itching.  Respiratory: Positive for shortness of breath. Negative for cough, chest tightness and wheezing.   Cardiovascular: Positive for palpitations. Negative for leg swelling.  Gastrointestinal: Negative.  Negative for nausea and vomiting.  Genitourinary: Negative.  Negative for dysuria.  Musculoskeletal: Negative.  Negative for joint swelling.  Skin: Negative.  Negative for rash.  Neurological: Negative.  Negative for headaches.  Hematological: Negative.  Does not bruise/bleed easily.  Psychiatric/Behavioral: Negative.  Negative for dysphoric mood. The patient is not nervous/anxious.        Objective:   Physical Exam        Assessment & Plan:

## 2011-08-14 NOTE — Patient Instructions (Addendum)
Please continue your medications as you are taking them Try using your albuterol nebulizer either before you exert yourself or if you are short of breath following exertion to see if it helps you We will perform full breathing tests at the time of your next appointment.  Walking oximetry today shows that you need to wear 3L/min when you are walking. Please increase to this with activity  Blood work today Follow with Dr Delton Coombes next available appointment with full PFT's on same day

## 2011-08-14 NOTE — Assessment & Plan Note (Signed)
Presumed COPD based on hx, exam, etc. She is unsure whether albuterol has helped her. Not clear to me how much this may be contributing to her dyspnea - could be largely due to her secondary PAH.  - check full PFT - if AFL present then will try long actiing BD's  - walking oximetry today

## 2011-08-14 NOTE — Progress Notes (Signed)
Subjective:     Patient ID: Kelly Hart, female   DOB: 1932-11-25, 78 y.o.   MRN: [id number]  HPI 78 yo woman, hx HTN and diastolic CHF, CRI, A Fib/MAT, hypothyroidism. Also hx tobacco use (30 pk-yrs) and possible COPD. Secondary PAH with cor pulmonale on milrinone infusion and started on tadalifil in 04/2011 at hospitalization. She is on chronic O2 at 2L/min started in 11/12. She is experiencing exertional SOB, slowly progressive.   Past Medical History  Diagnosis Date  . Hypertension   . Renal insufficiency     baseline around cr 1.3  . Atrial fibrillation   . COPD (chronic obstructive pulmonary disease)   . CHF (congestive heart failure)     diastolic  . PEA (Pulseless electrical activity) Oct 2012  . PAD (peripheral artery disease)   . Hyperlipidemia   . Hypothyroidism   . History of cervical cancer   . Chronic diarrhea     secondary to treatment of her cervical cancer  . Hydronephrosis, left   . Pulmonary hypertension   . GERD (gastroesophageal reflux disease)   . Myocardial infarction      Family History  Problem Relation Age of Onset  . Heart failure Brother   . Heart disease Other      History   Social History  . Marital Status: Divorced    Spouse Name: N/A    Number of Children: N/A  . Years of Education: N/A   Occupational History  . Not on file.   Social History Main Topics  . Smoking status: Former Smoker -- 0.5 packs/day for 40 years    Types: Cigarettes    Quit date: 01/15/2011  . Smokeless tobacco: Not on file  . Alcohol Use: No  . Drug Use: No  . Sexually Active: Not on file   Other Topics Concern  . Not on file   Social History Narrative   She is widowed and lives alone.  She has 4 daughters who provide a lot of support.  She is a former Scientist, product/process development.     Allergies  Allergen Reactions  . Iodine Swelling  . Lisinopril Swelling    throat  . Omnipaque (Iohexol) Other (See Comments)    Unknown  . Penicillins Swelling      Outpatient Prescriptions Prior to Visit  Medication Sig Dispense Refill  . amiodarone (PACERONE) 100 MG tablet Take 1 tablet (100 mg total) by mouth daily.  30 tablet  6  . aspirin EC 81 MG tablet Take 81 mg by mouth daily.        Marland Kitchen atorvastatin (LIPITOR) 40 MG tablet Take 40 mg by mouth daily.      . digoxin (LANOXIN) 0.125 MG tablet Take 0.0625 mcg by mouth daily.       . diphenhydrAMINE (BENADRYL) 25 mg capsule Take 25 mg by mouth at bedtime as needed.      Marland Kitchen levothyroxine (SYNTHROID, LEVOTHROID) 100 MCG tablet Take 100 mcg by mouth daily.      . magnesium oxide (MAG-OX) 400 MG tablet Take 400 mg by mouth daily.      . megestrol (MEGACE ES) 625 MG/5ML suspension Take 625 mg by mouth daily.      . milrinone (PRIMACOR) 200-5 MCG/ML-% infusion Inject 0.125 mcg/kg/min into the vein continuous.      . Multiple Vitamins-Minerals (MULTIVITAMINS THER. W/MINERALS) TABS Take 1 tablet by mouth daily.        . pantoprazole (PROTONIX) 40 MG tablet Take 40 mg by  mouth daily.      . Tadalafil, PAH, 20 MG TABS Take 20 mg by mouth daily.          Review of Systems As per HPI    Objective:   Physical Exam Filed Vitals:   08/14/11 1503  BP: 104/62  Pulse: 108  Temp: 97.7 F (36.5 C)    Gen: Pleasant, thin, in no distress,  normal affect on O2  ENT: No lesions,  mouth clear,  oropharynx clear, no postnasal drip  Neck: No JVD, no TMG, no carotid bruits  Lungs: No use of accessory muscles, no dullness to percussion, clear without rales or rhonchi  Cardiovascular: irregular, heart sounds normal, no murmur or gallops, no peripheral edema, milrinone infusion in place  Musculoskeletal: No deformities, no cyanosis or clubbing  Neuro: alert, non focal  Skin: Warm, no lesions or rashes    05/10/11 --  Study Conclusions  - Left ventricle: The cavity size was normal. Wall thickness was increased in a pattern of mild LVH. Systolic function was normal. The estimated ejection fraction was  in the range of 50% to 55%. Doppler parameters are consistent with abnormal left ventricular relaxation (grade 1 diastolic dysfunction). - Ventricular septum: Septal motion showed "bounce". The contour showed diastolic flattening and systolic flattening. - Right ventricle: The cavity size was moderately dilated. Systolic function was mildly reduced. - Right atrium: The atrium was mildly dilated. - Tricuspid valve: Severe regurgitation. - Pulmonary arteries: Systolic pressure was moderately increased.     Assessment:     COPD (chronic obstructive pulmonary disease) Presumed COPD based on hx, exam, etc. She is unsure whether albuterol has helped her. Not clear to me how much this may be contributing to her dyspnea - could be largely due to her secondary PAH.  - check full PFT - if AFL present then will try long actiing BD's  - walking oximetry today  Pulmonary hypertension Secondary PAH, appears to be well compensated currently.  - consider auto-immune labs, although PAH is probably due to her L sided issues +/- COPD - could consider increasing tadalafil to 20mg  qd at some point in future.  - will follow her pulm pressures w Dr Gala Romney

## 2011-08-14 NOTE — Assessment & Plan Note (Signed)
Secondary PAH, appears to be well compensated currently.  - consider auto-immune labs, although PAH is probably due to her L sided issues +/- COPD - could consider increasing tadalafil to 20mg  qd at some point in future.  - will follow her pulm pressures w Dr Gala Romney

## 2011-08-15 LAB — RHEUMATOID FACTOR: Rhuematoid fact SerPl-aCnc: <10 IU/mL (ref <=14)

## 2011-08-15 LAB — ANTI-DNA ANTIBODY, DOUBLE-STRANDED: ds DNA Ab: 1 IU/mL (ref <30)

## 2011-08-16 ENCOUNTER — Encounter: Payer: Self-pay | Admitting: Internal Medicine

## 2011-08-21 ENCOUNTER — Encounter (HOSPITAL_COMMUNITY): Payer: Self-pay

## 2011-08-23 ENCOUNTER — Encounter: Payer: Self-pay | Admitting: Internal Medicine

## 2011-08-28 ENCOUNTER — Encounter (HOSPITAL_COMMUNITY): Payer: Self-pay

## 2011-08-30 ENCOUNTER — Telehealth (HOSPITAL_COMMUNITY): Payer: Self-pay | Admitting: *Deleted

## 2011-08-30 NOTE — Telephone Encounter (Signed)
Ms Kelly Hart called today in regards to her mom, Kelly Hart.  Her B/P is up and she has been having dizzy spells on and off.  She would like a call back.  Thanks.

## 2011-08-31 ENCOUNTER — Telehealth (HOSPITAL_COMMUNITY): Payer: Self-pay | Admitting: Vascular Surgery

## 2011-08-31 MED ORDER — AMLODIPINE BESYLATE 2.5 MG PO TABS: 2.5000 mg | ORAL_TABLET | Freq: Every day | ORAL | Status: DC

## 2011-08-31 NOTE — Telephone Encounter (Signed)
Per Dr Gala Romney can start Amlodipine 2.5 mg daily for elevated BP rx sent in

## 2011-08-31 NOTE — Telephone Encounter (Signed)
The Rx is at the pharmacy per [facility].

## 2011-08-31 NOTE — Telephone Encounter (Signed)
[name] daughter Kelly Hart called about her moms blood pressure med. She talked to [facility] early and she was suppose to call the perscpt in pharmacy doesn't have it.

## 2011-09-03 ENCOUNTER — Ambulatory Visit (HOSPITAL_COMMUNITY)
Admission: RE | Admit: 2011-09-03 | Discharge: 2011-09-03 | Disposition: A | Discharge status: Home / Self Care | Payer: Medicare Other | Source: Ambulatory Visit | Attending: Internal Medicine | Admitting: Internal Medicine

## 2011-09-03 ENCOUNTER — Encounter (HOSPITAL_COMMUNITY): Payer: Self-pay

## 2011-09-03 VITALS — BP 160/80 | HR 102 | Ht 64.0 in | Wt 102.8 lb

## 2011-09-03 DIAGNOSIS — I10 Essential (primary) hypertension: Secondary | ICD-10-CM

## 2011-09-03 DIAGNOSIS — I2789 Other specified pulmonary heart diseases: Secondary | ICD-10-CM | POA: Insufficient documentation

## 2011-09-03 DIAGNOSIS — I272 Pulmonary hypertension, unspecified: Secondary | ICD-10-CM

## 2011-09-03 DIAGNOSIS — I279 Pulmonary heart disease, unspecified: Secondary | ICD-10-CM

## 2011-09-03 MED ORDER — AMLODIPINE BESYLATE 2.5 MG PO TABS: 5.0000 mg | ORAL_TABLET | Freq: Every day | ORAL | Status: DC

## 2011-09-03 NOTE — Assessment & Plan Note (Addendum)
SBP remains elevated. >130. Increase Norvasc to 5 mg daily.   Attending: Agree with increasing Norvasc. Can move to 5mg  and go to 10mg  as needed to try to get SBP in 120-140 range. Prescription provided for increased dose.

## 2011-09-03 NOTE — Patient Instructions (Signed)
Follow up in 2 months  Do the following things EVERYDAY: 1) Weigh yourself in the morning before breakfast. Write it down and keep it in a log. 2) Take your medicines as prescribed 3) Eat low salt foods-Limit salt (sodium) to 2000 mg per day.  4) Stay as active as you can everyday 5) Limit all fluids for the day to less than 2 liters 

## 2011-09-03 NOTE — Progress Notes (Signed)
Patient ID: Kelly Hart, female   DOB: 1932/12/22, 78 y.o.   MRN: [id number]  HPI:  Kelly Hart is a 78 year old AA female with of pulseless electrical activity arrest, right heart failure secondary to cor pulmonale,  pulmonary arterial hypertension, Acute-on-chronic respiratory failure, multifocal atrial tachycardia, peripheral arterial disease status post aortobifem bypass with acute occlusion of the right limb of the graft s/p thrombectomy by Dr. Arbie Cookey.    02/08/2011 Cardiac catheterization showed minimal coronary artery disease with 30% in the right coronary artery and 40%in the distal PDA. However, right atrial pressure, we had a mean of 16, a PA pressure of  61/30 with an EDP of 40.  Pulmonary capillary wedge pressure in the right lower lobe pulmonary artery showed a wedge of 30.  LV pressure was 101/9 with an EDP of 13.  There was significant wedged LVEDP gradient concerning for mitral stenosis.  Her Fick cardiac output was 2.0 and cardiac index was 1.4, this was on low-dose dopamine.  Her PA  saturations at that time were 39%.   12/6 magnesium 1.5 started Magnesium last week.  12/20 Mg 1.7, Mg Ox 400 daily   D/C 05/19/2011 [facility] SBO 08/22/11 Potassium 3.5 magnesium 1.9 Creatinine 1.17  May 17,2013 she was started on Norvasc 2.5 due to SBP>150.   She returns for follow up. Denies PND/Orthopnea. SOB with exertion. Dyspnea with ADLs.  Complains intermittent dizziness.  On chronic  2 liters.  She remains on Milrinone 0.25 mcg/kg/min which is maintained by [facility]. Weight at home 98-102 pounds. She has not required any Lasix.  Compliant with medications.   ROS: All systems negative except as listed in HPI, PMH and Problem List.  Past Medical History  Diagnosis Date  . Hypertension   . Renal insufficiency     baseline around cr 1.3  . Atrial fibrillation   . COPD (chronic obstructive pulmonary disease)   . CHF (congestive heart failure)     diastolic  . PEA (Pulseless electrical activity) Oct 2012  .  PAD (peripheral artery disease)   . Hyperlipidemia   . Hypothyroidism   . History of cervical cancer   . Chronic diarrhea     secondary to treatment of her cervical cancer  . Hydronephrosis, left   . Pulmonary hypertension   . GERD (gastroesophageal reflux disease)   . Myocardial infarction     Current Outpatient Prescriptions  Medication Sig Dispense Refill  . amiodarone (PACERONE) 100 MG tablet Take 1 tablet (100 mg total) by mouth daily.  30 tablet  6  . amLODipine (NORVASC) 2.5 MG tablet Take 1 tablet (2.5 mg total) by mouth daily.  30 tablet  6  . aspirin EC 81 MG tablet Take 81 mg by mouth daily.        Marland Kitchen atorvastatin (LIPITOR) 40 MG tablet Take 40 mg by mouth daily.      . digoxin (LANOXIN) 0.125 MG tablet Take 0.0625 mcg by mouth daily.       . diphenhydrAMINE (BENADRYL) 25 mg capsule Take 25 mg by mouth at bedtime as needed.      Marland Kitchen levothyroxine (SYNTHROID, LEVOTHROID) 100 MCG tablet Take 100 mcg by mouth daily.      . magnesium oxide (MAG-OX) 400 MG tablet Take 200 mg by mouth daily.       . megestrol (MEGACE ES) 625 MG/5ML suspension Take 625 mg by mouth as needed.       . milrinone (PRIMACOR) 200-5 MCG/ML-% infusion Inject 0.125 mcg/kg/min into  the vein continuous.      . Multiple Vitamins-Minerals (MULTIVITAMINS THER. W/MINERALS) TABS Take 1 tablet by mouth daily.        . pantoprazole (PROTONIX) 40 MG tablet Take 40 mg by mouth daily.      . Tadalafil, PAH, 20 MG TABS Take 10 mg by mouth daily.       Marland Kitchen DISCONTD: zolpidem (AMBIEN) 5 MG tablet Take 1 tablet (5 mg total) by mouth at bedtime as needed for sleep.  30 tablet  1     PHYSICAL EXAM: Filed Vitals:   09/03/11 1340  BP: 160/80  Pulse: 102  Height: 5\' 4"  (1.626 m)  Weight: 102 lb 12.8 oz (46.63 kg)  SpO2: 95%    General: Thin appearing. No resp difficulty wearing O2. Sitting in W-C on O2 HEENT: normal Neck: supple. JVP 5-6 Carotids 2+ bilaterally; no bruits. No lymphadenopathy or thryomegaly  appreciated. Cor: PMI normal.Tachycardiac Regular rate & rhythm. 2/6 SEM at RSB. +prominent s2. +RV lift Lungs: clear with decreased BS throughout. No wheezes.   Abdomen: soft, nontender, nondistended. No hepatosplenomegaly. No bruits or masses. Good bowel sounds. Extremities: no cyanosis, clubbing, rash, edema. RUE PICC Neuro: alert & orientedx3, cranial nerves grossly intact. Moves all 4 extremities w/o difficulty. Affect pleasant.    ASSESSMENT & PLAN:

## 2011-09-03 NOTE — Assessment & Plan Note (Addendum)
She continues to do quite well. She remains SOB with exertion by taking rest breaks. Volume status is stable.  Continue Adcirca 10 mg daily and continuous oxygen. Reinforced low salt diet choices. Follow up in 2 months.  Patient seen and examined with [name], NP. We discussed all aspects of the encounter. I agree with the assessment and plan as stated above. She is doing amazingly well on milrinone. NYHA III. Volume status looks good. Will continue current regimen.

## 2011-09-06 ENCOUNTER — Telehealth (HOSPITAL_COMMUNITY): Payer: Self-pay | Admitting: *Deleted

## 2011-09-06 ENCOUNTER — Encounter: Payer: Self-pay | Admitting: Internal Medicine

## 2011-09-06 MED ORDER — AMLODIPINE BESYLATE 10 MG PO TABS: 10.0000 mg | ORAL_TABLET | Freq: Every day | ORAL | Status: DC

## 2011-09-06 NOTE — Telephone Encounter (Signed)
Pt's bp still running high, per Dr Gala Romney increase Amlodipine to 10 mg daily, [name] is aware and will have pt increase med

## 2011-09-06 NOTE — Telephone Encounter (Signed)
Kelly Hart called today to let us know that Ms Flamenco B/P is still very high. She is 170/100.  He would like a call back. Thanks.

## 2011-09-11 ENCOUNTER — Telehealth (HOSPITAL_COMMUNITY): Payer: Self-pay | Admitting: *Deleted

## 2011-09-11 NOTE — Telephone Encounter (Signed)
Ms Larinda Buttery called today for her mom. Her B/P today, just a few minutes ago is 106/61.  She is concerned about how low it should go. Please call her back. Thanks.

## 2011-09-11 NOTE — Telephone Encounter (Signed)
Spoke with [name] regarding BP. Norvasc BP trending down 106-130. Norvasc increased 09/06/11 to 10 mg daily. Intermittent dizziness. Instructed to decrease Norvasc 5 mg daily if SBP <100 and call HF clinic. [name] verbalized understanding and appreciated the return phone call.

## 2011-09-12 ENCOUNTER — Telehealth (HOSPITAL_COMMUNITY): Payer: Self-pay | Admitting: *Deleted

## 2011-09-12 NOTE — Telephone Encounter (Signed)
Pt's k 3.2 per Dr Gala Romney pt needs to take 20 meq of kcl, attempted to call pt's daughter [name] but no answer will try again later

## 2011-09-13 NOTE — Telephone Encounter (Signed)
Still unable to reach pt's daughter

## 2011-09-17 NOTE — Telephone Encounter (Signed)
Spoke w/[name] she will give pt a dose of kcl today, labs will be repeated 6/6 or 6/7 by home health

## 2011-09-20 ENCOUNTER — Encounter: Payer: Self-pay | Admitting: Internal Medicine

## 2011-09-20 ENCOUNTER — Telehealth (HOSPITAL_COMMUNITY): Payer: Self-pay | Admitting: *Deleted

## 2011-09-20 NOTE — Telephone Encounter (Signed)
[name] called today in regards to Ms Vonbehren. He is experiencing problems with her picc line caps.  They flush and draw back ok, however, they cannot get them off.  He believes that the picc line nurse at the hospital should be able to do it, but he is concerned they may try to change her whole picc line which is not needed. Please call him back.  Thanks.

## 2011-09-21 ENCOUNTER — Encounter (HOSPITAL_COMMUNITY): Payer: Self-pay

## 2011-09-21 ENCOUNTER — Encounter: Payer: Self-pay | Admitting: Internal Medicine

## 2011-09-21 NOTE — Telephone Encounter (Signed)
Kelly Hart states they have an order to change the PICC line caps weekly, as it is an infection prevention, however he is unable to get the caps off, has has had others try as well with no success, he is concerned since it could be an infection risk, he states the lines are both returning blood and flush fine.  He is worried if he send her to there hospital for eval they will want to replace the whole line, will discuss with our IV team and call him back

## 2011-09-24 ENCOUNTER — Other Ambulatory Visit (HOSPITAL_COMMUNITY): Payer: Self-pay | Admitting: Adult Health

## 2011-09-24 MED ORDER — TADALAFIL (PAH) 20 MG PO TABS: 10.0000 mg | ORAL_TABLET | Freq: Every day | ORAL | Status: DC

## 2011-09-27 ENCOUNTER — Encounter: Payer: Self-pay | Admitting: Internal Medicine

## 2011-09-28 ENCOUNTER — Ambulatory Visit (INDEPENDENT_AMBULATORY_CARE_PROVIDER_SITE_OTHER): Payer: Medicare Other | Admitting: Emergency Medicine

## 2011-09-28 ENCOUNTER — Encounter: Payer: Self-pay | Admitting: Emergency Medicine

## 2011-09-28 VITALS — BP 118/80 | HR 101 | Temp 97.9°F | Ht 64.0 in | Wt 101.0 lb

## 2011-09-28 DIAGNOSIS — I272 Pulmonary hypertension, unspecified: Secondary | ICD-10-CM

## 2011-09-28 DIAGNOSIS — I2789 Other specified pulmonary heart diseases: Secondary | ICD-10-CM

## 2011-09-28 DIAGNOSIS — J449 Chronic obstructive pulmonary disease, unspecified: Secondary | ICD-10-CM

## 2011-09-28 LAB — PULMONARY FUNCTION TEST

## 2011-09-28 MED ORDER — TIOTROPIUM BROMIDE MONOHYDRATE 18 MCG IN CAPS: 18.0000 ug | ORAL_CAPSULE | Freq: Every day | RESPIRATORY_TRACT | Status: DC

## 2011-09-28 NOTE — Progress Notes (Signed)
PFT done today.  Pt was unable to perform lung volume.

## 2011-09-28 NOTE — Assessment & Plan Note (Signed)
Mild to moderate AFL + restriction on PFT today.  - trial Spiriva to see if she benefits.  - instructed her to increase o2 3L/min with all exertion

## 2011-09-28 NOTE — Progress Notes (Signed)
Subjective:     Patient ID: Kelly Hart, female   DOB: 08-03-1932, 78 y.o.   MRN: [id number] HPI 78 yo woman, hx HTN and diastolic CHF, CRI, A Fib/MAT, hypothyroidism. Also hx tobacco use (30 pk-yrs) and possible COPD. Secondary PAH with cor pulmonale on milrinone infusion and started on tadalifil in 04/2011 at hospitalization. She is on chronic O2 at 2L/min started in 11/12. She is experiencing exertional SOB, slowly progressive.   ROV 09/28/11 -- hx HTN and diastolic CHF, CRI, A Fib/MAT, hypothyroidism. Also hx tobacco use (30 pk-yrs) and possible COPD. On tadalifil 10mg  for secondary PAH. Returns for PFT today - mixed obstruction and restriction, severe diffusion defect, no BD response.  Wearing O2 at 2L/min, forgets to increase to 3L/min with exertion.      Objective:   Physical Exam Filed Vitals:   09/28/11 1440  BP: 118/80  Pulse: 101  Temp: 97.9 F (36.6 C)    Gen: Pleasant, thin, in no distress,  normal affect on O2  ENT: No lesions,  mouth clear,  oropharynx clear, no postnasal drip  Neck: No JVD, no TMG, no carotid bruits  Lungs: No use of accessory muscles, no dullness to percussion, clear without rales or rhonchi  Cardiovascular: irregular, heart sounds normal, no murmur or gallops, no peripheral edema, milrinone infusion in place  Musculoskeletal: No deformities, no cyanosis or clubbing  Neuro: alert, non focal  Skin: Warm, no lesions or rashes    05/10/11 --  Study Conclusions  - Left ventricle: The cavity size was normal. Wall thickness was increased in a pattern of mild LVH. Systolic function was normal. The estimated ejection fraction was in the range of 50% to 55%. Doppler parameters are consistent with abnormal left ventricular relaxation (grade 1 diastolic dysfunction). - Ventricular septum: Septal motion showed "bounce". The contour showed diastolic flattening and systolic flattening. - Right ventricle: The cavity size was moderately  dilated. Systolic function was mildly reduced. - Right atrium: The atrium was mildly dilated. - Tricuspid valve: Severe regurgitation. - Pulmonary arteries: Systolic pressure was moderately increased.     Assessment:     COPD (chronic obstructive pulmonary disease) Mild to moderate AFL + restriction on PFT today.  - trial Spiriva to see if she benefits.  - instructed her to increase o2 3L/min with all exertion   Pulmonary hypertension On milrinone + tadalafil 10mg 

## 2011-09-28 NOTE — Patient Instructions (Addendum)
Please remember to increase your oxygen to 3L/min when you are walking Start Spiriva once a day to see if this helps your breathing.  Follow with Dr Delton Coombes in 2 months to discuss your improvement on the medication

## 2011-09-28 NOTE — Assessment & Plan Note (Signed)
On milrinone + tadalafil 10mg 

## 2011-10-04 ENCOUNTER — Encounter: Payer: Self-pay | Admitting: Internal Medicine

## 2011-10-11 ENCOUNTER — Encounter: Payer: Self-pay | Admitting: Internal Medicine

## 2011-10-11 ENCOUNTER — Other Ambulatory Visit (HOSPITAL_COMMUNITY): Payer: Self-pay

## 2011-10-11 MED ORDER — DIGOXIN 125 MCG PO TABS: 0.0625 ug | ORAL_TABLET | Freq: Every day | ORAL | Status: AC

## 2011-10-17 ENCOUNTER — Encounter: Payer: Self-pay | Admitting: Internal Medicine

## 2011-10-22 ENCOUNTER — Encounter (HOSPITAL_COMMUNITY): Payer: Self-pay

## 2011-10-22 ENCOUNTER — Telehealth (HOSPITAL_COMMUNITY): Payer: Self-pay | Admitting: Adult Health

## 2011-10-22 NOTE — Telephone Encounter (Signed)
Discussed coping strategies. [name] requested Dr Gala Romney take over as primary care physician. She understands he is a Barrister[employer].  [name] was appreciative of phone call.

## 2011-10-22 NOTE — Telephone Encounter (Signed)
Message copied by [name] D on Mon Oct 22, 2011  9:42 AM ------      Message from: Gwen Her      Created: Mon Oct 22, 2011  9:08 AM      Contact: [phone]       Elvina Sidle, I called [name] to make a f/u for Aideen and she wanted to let you know that she has seen Dr Delton Coombes, and he put her on some neb treatments and she is really irritable now. [name] would like to know if there is anything you can do to help her. Thanks.

## 2011-10-25 ENCOUNTER — Telehealth (HOSPITAL_COMMUNITY): Payer: Self-pay | Admitting: *Deleted

## 2011-10-25 ENCOUNTER — Encounter: Payer: Self-pay | Admitting: Internal Medicine

## 2011-10-25 NOTE — Telephone Encounter (Signed)
Spoke w/David and order faxed for PICC line RN to eval pt's PICC line to change the caps (fax # [phone])

## 2011-10-25 NOTE — Telephone Encounter (Signed)
[name] called to let us know that they have gone ahead and made an appt for Kelly Hart to see their Picc line nurse tomorrow at 11 am. [phone], this is the direct line to hospital, you can ask for Doralee Albino.  This is to evaluate the caps and the problem they continue to have with them. Thanks.

## 2011-10-26 ENCOUNTER — Encounter (HOSPITAL_COMMUNITY): Payer: Self-pay

## 2011-10-31 ENCOUNTER — Encounter: Payer: Self-pay | Admitting: Internal Medicine

## 2011-10-31 ENCOUNTER — Telehealth (HOSPITAL_COMMUNITY): Payer: Self-pay | Admitting: *Deleted

## 2011-10-31 NOTE — Telephone Encounter (Signed)
Kelly Hart at home care in [place], called patient went to [employer] team last Friday to remove caps on pick line.  [name] cannot remove caps again today, any suggestions

## 2011-11-01 NOTE — Telephone Encounter (Signed)
Left message to call back  

## 2011-11-02 NOTE — Telephone Encounter (Signed)
Left mess if he is using hemostats and still can not get caps off then something may need to be changed, if he has more questions or concerns to call back

## 2011-11-08 ENCOUNTER — Encounter: Payer: Self-pay | Admitting: Internal Medicine

## 2011-11-15 ENCOUNTER — Telehealth (HOSPITAL_COMMUNITY): Payer: Self-pay | Admitting: Cardiology

## 2011-11-15 ENCOUNTER — Encounter: Payer: Self-pay | Admitting: Internal Medicine

## 2011-11-15 NOTE — Telephone Encounter (Signed)
Will evaluate at appointment on 11/19/2011.

## 2011-11-15 NOTE — Telephone Encounter (Signed)
Kelly Hart is unable to remove cath. Concerned about infection. Would like to know if a PICC line team could come to evaluate line during appt on Monday 11/19/11. Consider changing line.... Also pt asked since everything is going so well can she titrate down and consider d/c'ing Milrinone? Ok to call Kelly Hart with any questions   Chantel

## 2011-11-19 ENCOUNTER — Ambulatory Visit (HOSPITAL_COMMUNITY)
Admission: RE | Admit: 2011-11-19 | Discharge: 2011-11-19 | Disposition: A | Discharge status: Home / Self Care | Payer: Medicare Other | Source: Ambulatory Visit | Attending: Internal Medicine | Admitting: Internal Medicine

## 2011-11-19 ENCOUNTER — Encounter (HOSPITAL_COMMUNITY): Payer: Self-pay

## 2011-11-19 VITALS — BP 154/85 | HR 101 | Ht 64.0 in | Wt 103.0 lb

## 2011-11-19 DIAGNOSIS — J4489 Other specified chronic obstructive pulmonary disease: Secondary | ICD-10-CM | POA: Insufficient documentation

## 2011-11-19 DIAGNOSIS — I739 Peripheral vascular disease, unspecified: Secondary | ICD-10-CM | POA: Insufficient documentation

## 2011-11-19 DIAGNOSIS — I4891 Unspecified atrial fibrillation: Secondary | ICD-10-CM | POA: Insufficient documentation

## 2011-11-19 DIAGNOSIS — I252 Old myocardial infarction: Secondary | ICD-10-CM | POA: Insufficient documentation

## 2011-11-19 DIAGNOSIS — J9610 Chronic respiratory failure, unspecified whether with hypoxia or hypercapnia: Secondary | ICD-10-CM | POA: Insufficient documentation

## 2011-11-19 DIAGNOSIS — E039 Hypothyroidism, unspecified: Secondary | ICD-10-CM | POA: Insufficient documentation

## 2011-11-19 DIAGNOSIS — I2789 Other specified pulmonary heart diseases: Secondary | ICD-10-CM

## 2011-11-19 DIAGNOSIS — J449 Chronic obstructive pulmonary disease, unspecified: Secondary | ICD-10-CM | POA: Insufficient documentation

## 2011-11-19 DIAGNOSIS — I50810 Right heart failure, unspecified: Secondary | ICD-10-CM

## 2011-11-19 DIAGNOSIS — I10 Essential (primary) hypertension: Secondary | ICD-10-CM | POA: Insufficient documentation

## 2011-11-19 DIAGNOSIS — Z7982 Long term (current) use of aspirin: Secondary | ICD-10-CM | POA: Insufficient documentation

## 2011-11-19 DIAGNOSIS — N289 Disorder of kidney and ureter, unspecified: Secondary | ICD-10-CM | POA: Insufficient documentation

## 2011-11-19 DIAGNOSIS — K219 Gastro-esophageal reflux disease without esophagitis: Secondary | ICD-10-CM | POA: Insufficient documentation

## 2011-11-19 DIAGNOSIS — I498 Other specified cardiac arrhythmias: Secondary | ICD-10-CM | POA: Insufficient documentation

## 2011-11-19 DIAGNOSIS — I279 Pulmonary heart disease, unspecified: Secondary | ICD-10-CM | POA: Insufficient documentation

## 2011-11-19 DIAGNOSIS — Z8541 Personal history of malignant neoplasm of cervix uteri: Secondary | ICD-10-CM | POA: Insufficient documentation

## 2011-11-19 DIAGNOSIS — I509 Heart failure, unspecified: Secondary | ICD-10-CM | POA: Insufficient documentation

## 2011-11-19 DIAGNOSIS — I272 Pulmonary hypertension, unspecified: Secondary | ICD-10-CM

## 2011-11-19 DIAGNOSIS — N1330 Unspecified hydronephrosis: Secondary | ICD-10-CM | POA: Insufficient documentation

## 2011-11-19 DIAGNOSIS — Z9889 Other specified postprocedural states: Secondary | ICD-10-CM | POA: Insufficient documentation

## 2011-11-19 DIAGNOSIS — Z8674 Personal history of sudden cardiac arrest: Secondary | ICD-10-CM | POA: Insufficient documentation

## 2011-11-19 DIAGNOSIS — E785 Hyperlipidemia, unspecified: Secondary | ICD-10-CM | POA: Insufficient documentation

## 2011-11-19 MED ORDER — ALPRAZOLAM 0.25 MG PO TABS: 0.2500 mg | ORAL_TABLET | Freq: Two times a day (BID) | ORAL | Status: AC | PRN

## 2011-11-19 NOTE — Assessment & Plan Note (Addendum)
As above, continue Adcirca and milrinone.

## 2011-11-19 NOTE — Progress Notes (Signed)
Dr. Robynn Pane - PCP   HPI:  [name] is a 78 year old AA female with of pulseless electrical activity arrest, right heart failure secondary to cor pulmonale,  pulmonary arterial hypertension, chronic respiratory failure, multifocal atrial tachycardia, peripheral arterial disease status post aortobifem bypass with acute occlusion of the right limb of the graft s/p thrombectomy by Dr. Arbie Cookey.  SBO admit in February.    02/08/2011 Cardiac catheterization showed minimal coronary artery disease with 30% in the right coronary artery and 40%in the distal PDA. However, right atrial pressure, we had a mean of 16, a PA pressure of  61/30 with an EDP of 40.  Pulmonary capillary wedge pressure in the right lower lobe pulmonary artery showed a wedge of 30.  LV pressure was 101/9 with an EDP of 13.  There was significant wedged LVEDP gradient concerning for mitral stenosis.  Her Fick cardiac output was 2.0 and cardiac index was 1.4, this was on low-dose dopamine.  Her PA  saturations at that time were 39%.   05/10/11: Echo LV 50-55%.  Grade 1 diastolic dysfunction. Ventricular septum: Septal motion showed "bounce", contour showed diastolic flattening and systolic flattening.  RV moderately dilated with mildly reduced.  RA mildly dilated.  Severe TR.  Pulmonary arteries with systolic pressure mod increased.   She returns for follow up. Feels good.  Continues on milrinone 0.125 mcg/kg/min.  Weight stable at 100.9 pounds.  SBP high 120s-130s.  No PND/Orthopnea. SOB with exertion. Dyspnea with ADLs.  On chronic  2 liters.  She has not required any Lasix.  Compliant with medications. Increased anxiety with end of life discussions with family.    ROS: All systems negative except as listed in HPI, PMH and Problem List.  Past Medical History  Diagnosis Date  . Hypertension   . Renal insufficiency     baseline around cr 1.3  . Atrial fibrillation   . COPD (chronic obstructive pulmonary disease)   . CHF (congestive heart  failure)     diastolic  . PEA (Pulseless electrical activity) Oct 2012  . PAD (peripheral artery disease)   . Hyperlipidemia   . Hypothyroidism   . History of cervical cancer   . Chronic diarrhea     secondary to treatment of her cervical cancer  . Hydronephrosis, left   . Pulmonary hypertension   . GERD (gastroesophageal reflux disease)   . Myocardial infarction     Current Outpatient Prescriptions  Medication Sig Dispense Refill  . amiodarone (PACERONE) 200 MG tablet Take 200 mg by mouth daily.      Marland Kitchen amLODipine (NORVASC) 10 MG tablet Take 1 tablet (10 mg total) by mouth daily.  30 tablet  6  . aspirin EC 81 MG tablet Take 81 mg by mouth daily.        Marland Kitchen atorvastatin (LIPITOR) 40 MG tablet Take 40 mg by mouth daily.      . digoxin (LANOXIN) 0.125 MG tablet Take 0.5 tablets (62.5 mcg total) by mouth daily.  15 tablet  6  . diphenhydrAMINE (BENADRYL) 25 mg capsule Take 25 mg by mouth at bedtime as needed.      Marland Kitchen levothyroxine (SYNTHROID, LEVOTHROID) 150 MCG tablet Take 150 mcg by mouth daily.      . magnesium oxide (MAG-OX) 400 MG tablet Take 200 mg by mouth daily.       . megestrol (MEGACE ES) 625 MG/5ML suspension Take 625 mg by mouth as needed.       . milrinone (PRIMACOR) 200-5  MCG/ML-% infusion Inject 0.125 mcg/kg/min into the vein continuous.      . Multiple Vitamins-Minerals (MULTIVITAMINS THER. W/MINERALS) TABS Take 1 tablet by mouth daily.        . pantoprazole (PROTONIX) 40 MG tablet Take 40 mg by mouth daily.      . Tadalafil, PAH, 20 MG TABS Take 0.5 tablets (10 mg total) by mouth daily.  30 tablet  3  . tiotropium (SPIRIVA) 18 MCG inhalation capsule Place 1 capsule (18 mcg total) into inhaler and inhale daily.  30 capsule  6  . DISCONTD: zolpidem (AMBIEN) 5 MG tablet Take 1 tablet (5 mg total) by mouth at bedtime as needed for sleep.  30 tablet  1     PHYSICAL EXAM: Filed Vitals:   11/19/11 1133  BP: 154/85  Pulse: 101  Height: 5\' 4"  (1.626 m)  Weight: 103 lb  (46.72 kg)  SpO2: 93%    General: Thin appearing. No resp difficulty wearing O2. Sitting in W-C on O2 HEENT: normal Neck: supple. JVP 5-6 Carotids 2+ bilaterally; no bruits. No lymphadenopathy or thryomegaly appreciated. Cor: PMI normal.Tachycardiac Regular rate & rhythm. 2/6 SEM at RSB. +prominent s2. +RV lift Lungs: clear with decreased BS throughout. No wheezes.   Abdomen: soft, nontender, nondistended. No hepatosplenomegaly. No bruits or masses. Good bowel sounds. Extremities: no cyanosis, clubbing, rash, edema. RUE PICC Neuro: alert & orientedx3, cranial nerves grossly intact. Moves all 4 extremities w/o difficulty. Affect pleasant.    ASSESSMENT & PLAN:

## 2011-11-19 NOTE — Assessment & Plan Note (Addendum)
Continues to do very well on milrinone and Adcirca. Volume status looks good. Will continue current therapy.  Had PICC team exam sterile caps and ports, ports mildly stripped but does not require replacement currently.    Patient seen and examined with [name], NP. We discussed all aspects of the encounter. I agree with the assessment and plan as stated above. She is doing great on milrinone. She again asked about weaning off of milrinone but I don't think she will tolerate given her Class IV symptoms prior to initiation.

## 2011-11-19 NOTE — Assessment & Plan Note (Signed)
Mildly elevated today in clinic but controlled by home readings.  No changes at this time.

## 2011-11-19 NOTE — Patient Instructions (Addendum)
Continue current regimen.  Xanax as needed for anxiety  Follow up 3 months.

## 2011-11-22 ENCOUNTER — Encounter: Payer: Self-pay | Admitting: Internal Medicine

## 2011-11-29 ENCOUNTER — Encounter: Payer: Self-pay | Admitting: Internal Medicine

## 2011-11-30 ENCOUNTER — Ambulatory Visit (INDEPENDENT_AMBULATORY_CARE_PROVIDER_SITE_OTHER): Payer: Medicare Other | Admitting: Emergency Medicine

## 2011-11-30 ENCOUNTER — Encounter: Payer: Self-pay | Admitting: Emergency Medicine

## 2011-11-30 VITALS — BP 130/78 | HR 117 | Temp 97.0°F | Ht 64.0 in | Wt 102.0 lb

## 2011-11-30 DIAGNOSIS — I272 Pulmonary hypertension, unspecified: Secondary | ICD-10-CM

## 2011-11-30 DIAGNOSIS — J449 Chronic obstructive pulmonary disease, unspecified: Secondary | ICD-10-CM

## 2011-11-30 DIAGNOSIS — I2789 Other specified pulmonary heart diseases: Secondary | ICD-10-CM

## 2011-11-30 DIAGNOSIS — J4489 Other specified chronic obstructive pulmonary disease: Secondary | ICD-10-CM

## 2011-11-30 NOTE — Assessment & Plan Note (Signed)
Felt worse on spiriva. ? Side effect vs caused v/q mismatch.  - will try alternative BD; if her sx were due to V/Q mismatch, the LABA may make her worse also. If so then will d/c - rov 1 month

## 2011-11-30 NOTE — Progress Notes (Signed)
Subjective:     Patient ID: Kelly Hart, female   DOB: May 06, 1932, 78 y.o.   MRN: [id number] HPI 78 yo woman, hx HTN and diastolic CHF, CRI, A Fib/MAT, hypothyroidism. Also hx tobacco use (30 pk-yrs) and possible COPD. Secondary PAH with cor pulmonale on milrinone infusion and started on tadalifil in 04/2011 at hospitalization. She is on chronic O2 at 2L/min started in 11/12. She is experiencing exertional SOB, slowly progressive.   ROV 76/14/13 -- hx HTN and diastolic CHF, CRI, A Fib/MAT, hypothyroidism. Also hx tobacco use (30 pk-yrs) and possible COPD. On tadalifil 10mg  for secondary PAH. Returns for PFT today - mixed obstruction and restriction, severe diffusion defect, no BD response.  Wearing O2 at 2L/min, forgets to increase to 3L/min with exertion.   ROV 11/30/11 -- hx HTN and diastolic CHF, CRI, A Fib/MAT, hypothyroidism. Also hx tobacco use (30 pk-yrs) and possible COPD. On tadalifil 10mg  for secondary PAH. We started spiriva in June for mixed disease. She felt that she was more SOB on the Spiriva, difficulty walking thru the house. She stopped the spiriva 2 weeks ago and feels that she is almost back to baseline. Her O2 compliance is questionable.         Objective:   Physical Exam Filed Vitals:   11/30/11 1339  BP: 130/78  Pulse: 117  Temp: 97 F (36.1 C)    Gen: Pleasant, thin, in no distress,  normal affect on O2  ENT: No lesions,  mouth clear,  oropharynx clear, no postnasal drip  Neck: No JVD, no TMG, no carotid bruits  Lungs: No use of accessory muscles, no dullness to percussion, clear without rales or rhonchi  Cardiovascular: irregular, heart sounds normal, no murmur or gallops, no peripheral edema, milrinone infusion in place  Musculoskeletal: No deformities, no cyanosis or clubbing  Neuro: alert, non focal  Skin: Warm, no lesions or rashes   05/10/11 --  Study Conclusions  - Left ventricle: The cavity size was normal. Wall thickness was increased in a  pattern of mild LVH. Systolic function was normal. The estimated ejection fraction was in the range of 50% to 55%. Doppler parameters are consistent with abnormal left ventricular relaxation (grade 1 diastolic dysfunction). - Ventricular septum: Septal motion showed "bounce". The contour showed diastolic flattening and systolic flattening. - Right ventricle: The cavity size was moderately dilated. Systolic function was mildly reduced. - Right atrium: The atrium was mildly dilated. - Tricuspid valve: Severe regurgitation. - Pulmonary arteries: Systolic pressure was moderately increased.     Assessment:     Pulmonary hypertension Reviewed goals with her O2 Continue tadalifil   COPD (chronic obstructive pulmonary disease) Felt worse on spiriva. ? Side effect vs caused v/q mismatch.  - will try alternative BD; if her sx were due to V/Q mismatch, the LABA may make her worse also. If so then will d/c - rov 1 month

## 2011-11-30 NOTE — Assessment & Plan Note (Signed)
Reviewed goals with her O2 Continue tadalifil

## 2011-11-30 NOTE — Patient Instructions (Addendum)
Start Symbicort 80/4.38mcg, 2 puffs twice a day Continue your other medications as you are taking them Follow with Dr Delton Coombes in 1 month

## 2011-12-06 ENCOUNTER — Encounter: Payer: Self-pay | Admitting: Internal Medicine

## 2011-12-13 ENCOUNTER — Encounter: Payer: Self-pay | Admitting: Internal Medicine

## 2011-12-26 DIAGNOSIS — I2789 Other specified pulmonary heart diseases: Secondary | ICD-10-CM

## 2012-01-03 ENCOUNTER — Encounter: Payer: Self-pay | Admitting: Internal Medicine

## 2012-01-04 ENCOUNTER — Telehealth (HOSPITAL_COMMUNITY): Payer: Self-pay | Admitting: Cardiology

## 2012-01-04 ENCOUNTER — Ambulatory Visit (INDEPENDENT_AMBULATORY_CARE_PROVIDER_SITE_OTHER): Payer: Medicare Other | Admitting: Emergency Medicine

## 2012-01-04 ENCOUNTER — Encounter: Payer: Self-pay | Admitting: Emergency Medicine

## 2012-01-04 VITALS — BP 118/82 | HR 116 | Temp 97.9°F | Ht 64.0 in | Wt 105.0 lb

## 2012-01-04 DIAGNOSIS — I2789 Other specified pulmonary heart diseases: Secondary | ICD-10-CM

## 2012-01-04 DIAGNOSIS — I272 Pulmonary hypertension, unspecified: Secondary | ICD-10-CM

## 2012-01-04 DIAGNOSIS — J449 Chronic obstructive pulmonary disease, unspecified: Secondary | ICD-10-CM

## 2012-01-04 NOTE — Telephone Encounter (Signed)
pts daughter called to request a refill on pts "nerve meds".  Per vo Laurell Roof Ok to refill Xanax 0.25mg  one po bid prn #[phone] no refill   Refill called into [facility] pharmacy [phone]  @1504    [name] aware @1505  [phone])

## 2012-01-04 NOTE — Progress Notes (Signed)
Subjective:     Patient ID: Kelly Hart, female   DOB: [birth year], 78 y.o.   MRN: [id number] HPI 78 yo woman, hx HTN and diastolic CHF, CRI, A Fib/MAT, hypothyroidism. Also hx tobacco use (30 pk-yrs) and possible COPD. Secondary PAH with cor pulmonale on milrinone infusion and started on tadalifil in 04/2011 at hospitalization. She is on chronic O2 at 2L/min started in 11/12. She is experiencing exertional SOB, slowly progressive.   ROV 09/28/11 -- hx HTN and diastolic CHF, CRI, A Fib/MAT, hypothyroidism. Also hx tobacco use (30 pk-yrs) and possible COPD. On tadalifil 10mg  for secondary PAH. Returns for PFT today - mixed obstruction and restriction, severe diffusion defect, no BD response.  Wearing O2 at 2L/min, forgets to increase to 3L/min with exertion.   ROV 11/30/11 -- hx HTN and diastolic CHF, CRI, A Fib/MAT, hypothyroidism. Also hx tobacco use (30 pk-yrs) and possible COPD. On tadalifil 10mg  for secondary PAH. We started spiriva in June for mixed disease. She felt that she was more SOB on the Spiriva, difficulty walking thru the house. She stopped the spiriva 2 weeks ago and feels that she is almost back to baseline. Her O2 compliance is questionable.    ROV 01/04/12 -- hx HTN and diastolic CHF, CRI, A Fib/MAT followed by Dr Gala Romney, hypothyroidism. Also hx tobacco use (30 pk-yrs) and possible COPD (mixed disease with DLCO defect). Remains on tadalifil 10mg   for her secondary PAH. Did not benefit from Spiriva, last time we did trial Symbicort to see if she would benefit. She isn't sure whether it helped her. She is severely dyspneic with exertion. Hypoxic today with walking, reports hypoxemia at rest at home on the RA.         Objective:   Physical Exam Filed Vitals:   01/04/12 1503  BP: 118/82  Pulse: 116  Temp: 97.9 F (36.6 C)   Gen: Pleasant, thin, in no distress,  normal affect on O2  ENT: No lesions,  mouth clear,  oropharynx clear, no postnasal drip  Neck: No JVD, no TMG, no  carotid bruits  Lungs: No use of accessory muscles, no dullness to percussion, clear without rales or rhonchi  Cardiovascular: irregular, heart sounds normal, no murmur or gallops, no peripheral edema, milrinone infusion in place  Musculoskeletal: No deformities, no cyanosis or clubbing  Neuro: alert, non focal  Skin: Warm, no lesions or rashes   05/10/11 --  Study Conclusions  - Left ventricle: The cavity size was normal. Wall thickness was increased in a pattern of mild LVH. Systolic function was normal. The estimated ejection fraction was in the range of 50% to 55%. Doppler parameters are consistent with abnormal left ventricular relaxation (grade 1 diastolic dysfunction). - Ventricular septum: Septal motion showed "bounce". The contour showed diastolic flattening and systolic flattening. - Right ventricle: The cavity size was moderately dilated. Systolic function was mildly reduced. - Right atrium: The atrium was mildly dilated. - Tricuspid valve: Severe regurgitation. - Pulmonary arteries: Systolic pressure was moderately increased.     Assessment:     Pulmonary hypertension - need to increase her O2 to 4L/min with walking, work on resting compliance.  - ? Whether she could tolerate increase tadalifil to 20mg  qd. Will discuss with dr bensimhon since she is on milrinone, etc.  - rov 2 mon  COPD (chronic obstructive pulmonary disease) Unclear whether symbicort was given a fair trial. I'd like to try treating her PAH more aggressively before we do any more BD's - seems to  be low yield

## 2012-01-04 NOTE — Assessment & Plan Note (Signed)
-   need to increase her O2 to 4L/min with walking, work on resting compliance.  - ? Whether she could tolerate increase tadalifil to 20mg  qd. Will discuss with dr bensimhon since she is on milrinone, etc.  - rov 2 mon

## 2012-01-04 NOTE — Assessment & Plan Note (Signed)
Unclear whether symbicort was given a fair trial. I'd like to try treating her PAH more aggressively before we do any more BD's - seems to be low yield

## 2012-01-04 NOTE — Patient Instructions (Addendum)
Stop Symbicort for now.  You need to wear your oxygen at ALL TIMES. Wear it on 2L/min at rest, turn it up to 4L/min when you walk or exert yourself.  We will discuss with Dr Gala Romney possibly increasing your tadalifil to 20mg  daily  Continue your other medications as Dr Gala Romney has ordered.  Follow with Dr Delton Coombes in 2 months or sooner if you have any problems.

## 2012-01-07 ENCOUNTER — Inpatient Hospital Stay (HOSPITAL_COMMUNITY)
Admission: EM | Admit: 2012-01-07 | Discharge: 2012-01-10 | DRG: 286 | Disposition: A | Discharge status: Home / Self Care | Payer: Medicare Other | Attending: Internal Medicine | Admitting: Internal Medicine

## 2012-01-07 ENCOUNTER — Emergency Department (HOSPITAL_COMMUNITY): Payer: Medicare Other

## 2012-01-07 ENCOUNTER — Encounter (HOSPITAL_COMMUNITY): Payer: Self-pay | Admitting: Emergency Medicine

## 2012-01-07 DIAGNOSIS — I5033 Acute on chronic diastolic (congestive) heart failure: Secondary | ICD-10-CM | POA: Diagnosis present

## 2012-01-07 DIAGNOSIS — N189 Chronic kidney disease, unspecified: Secondary | ICD-10-CM | POA: Diagnosis present

## 2012-01-07 DIAGNOSIS — I252 Old myocardial infarction: Secondary | ICD-10-CM

## 2012-01-07 DIAGNOSIS — Z8541 Personal history of malignant neoplasm of cervix uteri: Secondary | ICD-10-CM

## 2012-01-07 DIAGNOSIS — I50810 Right heart failure, unspecified: Secondary | ICD-10-CM | POA: Diagnosis present

## 2012-01-07 DIAGNOSIS — I10 Essential (primary) hypertension: Secondary | ICD-10-CM

## 2012-01-07 DIAGNOSIS — E039 Hypothyroidism, unspecified: Secondary | ICD-10-CM | POA: Diagnosis present

## 2012-01-07 DIAGNOSIS — I129 Hypertensive chronic kidney disease with stage 1 through stage 4 chronic kidney disease, or unspecified chronic kidney disease: Secondary | ICD-10-CM | POA: Diagnosis present

## 2012-01-07 DIAGNOSIS — I498 Other specified cardiac arrhythmias: Secondary | ICD-10-CM | POA: Diagnosis present

## 2012-01-07 DIAGNOSIS — I739 Peripheral vascular disease, unspecified: Secondary | ICD-10-CM | POA: Diagnosis present

## 2012-01-07 DIAGNOSIS — Z9981 Dependence on supplemental oxygen: Secondary | ICD-10-CM

## 2012-01-07 DIAGNOSIS — Z7982 Long term (current) use of aspirin: Secondary | ICD-10-CM

## 2012-01-07 DIAGNOSIS — Z87891 Personal history of nicotine dependence: Secondary | ICD-10-CM

## 2012-01-07 DIAGNOSIS — D649 Anemia, unspecified: Secondary | ICD-10-CM

## 2012-01-07 DIAGNOSIS — J449 Chronic obstructive pulmonary disease, unspecified: Secondary | ICD-10-CM | POA: Diagnosis present

## 2012-01-07 DIAGNOSIS — R110 Nausea: Secondary | ICD-10-CM

## 2012-01-07 DIAGNOSIS — I509 Heart failure, unspecified: Secondary | ICD-10-CM | POA: Diagnosis present

## 2012-01-07 DIAGNOSIS — J4489 Other specified chronic obstructive pulmonary disease: Secondary | ICD-10-CM | POA: Diagnosis present

## 2012-01-07 DIAGNOSIS — F411 Generalized anxiety disorder: Secondary | ICD-10-CM | POA: Diagnosis present

## 2012-01-07 DIAGNOSIS — R0602 Shortness of breath: Secondary | ICD-10-CM | POA: Diagnosis present

## 2012-01-07 DIAGNOSIS — R0600 Dyspnea, unspecified: Secondary | ICD-10-CM

## 2012-01-07 DIAGNOSIS — E876 Hypokalemia: Secondary | ICD-10-CM | POA: Diagnosis present

## 2012-01-07 DIAGNOSIS — I2789 Other specified pulmonary heart diseases: Principal | ICD-10-CM | POA: Diagnosis present

## 2012-01-07 DIAGNOSIS — K56609 Unspecified intestinal obstruction, unspecified as to partial versus complete obstruction: Secondary | ICD-10-CM

## 2012-01-07 DIAGNOSIS — J9610 Chronic respiratory failure, unspecified whether with hypoxia or hypercapnia: Secondary | ICD-10-CM | POA: Diagnosis present

## 2012-01-07 DIAGNOSIS — I272 Pulmonary hypertension, unspecified: Secondary | ICD-10-CM | POA: Diagnosis present

## 2012-01-07 LAB — CBC WITH DIFFERENTIAL/PLATELET
Basophils Absolute: 0 10*3/uL (ref 0.0–0.1)
Eosinophils Relative: 1 % (ref 0–5)
HCT: 36.5 % (ref 36.0–46.0)
Hemoglobin: 12.3 g/dL (ref 12.0–15.0)
Lymphocytes Relative: 21 % (ref 12–46)
MCHC: 33.7 g/dL (ref 30.0–36.0)
MCV: 87.3 fL (ref 78.0–100.0)
Monocytes Absolute: 0.6 10*3/uL (ref 0.1–1.0)
Monocytes Relative: 8 % (ref 3–12)
Neutro Abs: 5.5 10*3/uL (ref 1.7–7.7)
RDW: 15.2 % (ref 11.5–15.5)
WBC: 7.8 10*3/uL (ref 4.0–10.5)

## 2012-01-07 LAB — COMPREHENSIVE METABOLIC PANEL
BUN: 14 mg/dL (ref 6–23)
CO2: 22 mEq/L (ref 19–32)
Calcium: 9.7 mg/dL (ref 8.4–10.5)
Chloride: 103 mEq/L (ref 96–112)
Creatinine, Ser: 1.3 mg/dL — ABNORMAL HIGH (ref 0.50–1.10)
GFR calc Af Amer: 45 mL/min — ABNORMAL LOW (ref >90)
GFR calc non Af Amer: 39 mL/min — ABNORMAL LOW (ref >90)
Total Bilirubin: 0.6 mg/dL (ref 0.3–1.2)

## 2012-01-07 LAB — PRO B NATRIURETIC PEPTIDE: Pro B Natriuretic peptide (BNP): 1049 pg/mL — ABNORMAL HIGH (ref 0–450)

## 2012-01-07 NOTE — ED Notes (Signed)
PT. REPORTS EXERTIONAL DYSPNEA  WITH GENERALIZED WEAKNESS AND LIGHTHEADED FOR 1 WEEK , DENIES COUGH OR CONGESTION . NO CHEST PAIN

## 2012-01-08 DIAGNOSIS — I2789 Other specified pulmonary heart diseases: Principal | ICD-10-CM

## 2012-01-08 DIAGNOSIS — R0602 Shortness of breath: Secondary | ICD-10-CM | POA: Diagnosis present

## 2012-01-08 DIAGNOSIS — I10 Essential (primary) hypertension: Secondary | ICD-10-CM

## 2012-01-08 DIAGNOSIS — I509 Heart failure, unspecified: Secondary | ICD-10-CM

## 2012-01-08 DIAGNOSIS — R0609 Other forms of dyspnea: Secondary | ICD-10-CM

## 2012-01-08 LAB — TROPONIN I
Troponin I: <0.3 ng/mL (ref <0.30)
Troponin I: <0.3 ng/mL (ref <0.30)

## 2012-01-08 LAB — DIGOXIN LEVEL: Digoxin Level: 0.8 ng/mL (ref 0.8–2.0)

## 2012-01-08 MED ORDER — MILRINONE IN DEXTROSE 200-5 MCG/ML-% IV SOLN
0.2500 ug/kg/min | INTRAVENOUS | Status: DC
  Administered 2012-01-08 – 2012-01-10 (×3): 0.25 ug/kg/min via INTRAVENOUS
  Filled 2012-01-08 (×3): qty 100

## 2012-01-08 MED ORDER — SODIUM CHLORIDE 0.9 % IJ SOLN
3.0000 mL | Freq: Two times a day (BID) | INTRAMUSCULAR | Status: DC
  Administered 2012-01-09: 3 mL via INTRAVENOUS

## 2012-01-08 MED ORDER — AMIODARONE HCL 200 MG PO TABS
200.0000 mg | ORAL_TABLET | Freq: Every day | ORAL | Status: DC
  Administered 2012-01-08 – 2012-01-10 (×3): 200 mg via ORAL
  Filled 2012-01-08 (×3): qty 1

## 2012-01-08 MED ORDER — PANTOPRAZOLE SODIUM 40 MG PO TBEC
40.0000 mg | DELAYED_RELEASE_TABLET | Freq: Every day | ORAL | Status: DC
  Administered 2012-01-08 – 2012-01-10 (×3): 40 mg via ORAL
  Filled 2012-01-08 (×2): qty 1

## 2012-01-08 MED ORDER — POTASSIUM CHLORIDE CRYS ER 20 MEQ PO TBCR
20.0000 meq | EXTENDED_RELEASE_TABLET | Freq: Two times a day (BID) | ORAL | Status: DC
  Administered 2012-01-08 – 2012-01-10 (×5): 20 meq via ORAL
  Filled 2012-01-08 (×8): qty 1

## 2012-01-08 MED ORDER — TIOTROPIUM BROMIDE MONOHYDRATE 18 MCG IN CAPS
18.0000 ug | ORAL_CAPSULE | Freq: Every day | RESPIRATORY_TRACT | Status: DC
  Filled 2012-01-08: qty 5

## 2012-01-08 MED ORDER — SODIUM CHLORIDE 0.9 % IJ SOLN: 3.0000 mL | INTRAMUSCULAR | Status: DC | PRN

## 2012-01-08 MED ORDER — SODIUM CHLORIDE 0.9 % IJ SOLN: 10.0000 mL | INTRAMUSCULAR | Status: DC | PRN

## 2012-01-08 MED ORDER — POTASSIUM CHLORIDE 20 MEQ PO PACK
20.0000 meq | PACK | Freq: Two times a day (BID) | ORAL | Status: DC
  Administered 2012-01-08: 20 meq via ORAL
  Filled 2012-01-08 (×2): qty 1

## 2012-01-08 MED ORDER — AMLODIPINE BESYLATE 10 MG PO TABS
10.0000 mg | ORAL_TABLET | Freq: Every day | ORAL | Status: DC
  Administered 2012-01-08: 10 mg via ORAL
  Administered 2012-01-10: 5 mg via ORAL
  Filled 2012-01-08 (×3): qty 1

## 2012-01-08 MED ORDER — SODIUM CHLORIDE 0.9 % IV SOLN: 250.0000 mL | INTRAVENOUS | Status: DC | PRN

## 2012-01-08 MED ORDER — DIPHENHYDRAMINE HCL 12.5 MG/5ML PO ELIX
12.5000 mg | ORAL_SOLUTION | ORAL | Status: DC | PRN
  Administered 2012-01-09: 12.5 mg via ORAL
  Filled 2012-01-08 (×2): qty 5

## 2012-01-08 MED ORDER — SODIUM CHLORIDE 0.9 % IJ SOLN: 3.0000 mL | Freq: Two times a day (BID) | INTRAMUSCULAR | Status: DC

## 2012-01-08 MED ORDER — MILRINONE IN DEXTROSE 200-5 MCG/ML-% IV SOLN
0.1250 ug/kg/min | INTRAVENOUS | Status: DC
  Filled 2012-01-08: qty 100

## 2012-01-08 MED ORDER — DIGOXIN 125 MCG PO TABS
0.0625 mg | ORAL_TABLET | Freq: Every day | ORAL | Status: DC
  Administered 2012-01-08 – 2012-01-10 (×3): 0.0625 mg via ORAL
  Filled 2012-01-08 (×3): qty 0.5

## 2012-01-08 MED ORDER — FUROSEMIDE 10 MG/ML IJ SOLN
40.0000 mg | Freq: Two times a day (BID) | INTRAMUSCULAR | Status: DC
  Administered 2012-01-08 – 2012-01-09 (×2): 40 mg via INTRAVENOUS
  Filled 2012-01-08 (×4): qty 4

## 2012-01-08 MED ORDER — ASPIRIN 81 MG PO CHEW
324.0000 mg | CHEWABLE_TABLET | ORAL | Status: AC
  Administered 2012-01-09: 324 mg via ORAL
  Filled 2012-01-08: qty 4

## 2012-01-08 MED ORDER — ATORVASTATIN CALCIUM 40 MG PO TABS
40.0000 mg | ORAL_TABLET | Freq: Every day | ORAL | Status: DC
  Administered 2012-01-08 – 2012-01-09 (×2): 40 mg via ORAL
  Filled 2012-01-08 (×3): qty 1

## 2012-01-08 MED ORDER — TADALAFIL (PAH) 20 MG PO TABS
10.0000 mg | ORAL_TABLET | Freq: Every day | ORAL | Status: DC
  Administered 2012-01-08 – 2012-01-09 (×2): 10 mg via ORAL
  Filled 2012-01-08 (×4): qty 0.5

## 2012-01-08 MED ORDER — ALBUTEROL SULFATE (5 MG/ML) 0.5% IN NEBU: 2.5000 mg | INHALATION_SOLUTION | RESPIRATORY_TRACT | Status: DC | PRN

## 2012-01-08 MED ORDER — ASPIRIN EC 81 MG PO TBEC
81.0000 mg | DELAYED_RELEASE_TABLET | Freq: Every day | ORAL | Status: DC
  Administered 2012-01-08 – 2012-01-10 (×3): 81 mg via ORAL
  Filled 2012-01-08 (×3): qty 1

## 2012-01-08 MED ORDER — LEVOTHYROXINE SODIUM 150 MCG PO TABS
150.0000 ug | ORAL_TABLET | Freq: Every day | ORAL | Status: DC
  Administered 2012-01-08 – 2012-01-10 (×3): 150 ug via ORAL
  Filled 2012-01-08 (×5): qty 1

## 2012-01-08 MED ORDER — SODIUM CHLORIDE 0.9 % IJ SOLN
10.0000 mL | Freq: Two times a day (BID) | INTRAMUSCULAR | Status: DC
  Administered 2012-01-08: 20 mL

## 2012-01-08 NOTE — ED Notes (Signed)
Pt reports increased shortness of breath x1 week, worse today - denies fever, cough, chills or pain at present. Pt w/ hx of COPD and is on continuous O2 at home via [place] at 3L/min. Pt speaking complete sentences on assessment, in no acute distress, resting comfortably in bed. Family x1 at the bedside.

## 2012-01-08 NOTE — Care Management Note (Signed)
    Page 1 of 1   01/10/2012     10:46:42 AM   CARE MANAGEMENT NOTE 01/10/2012  Patient:  Kelly Hart, Kelly Hart   Account Number:  [id number]  Date Initiated:  01/08/2012  Documentation initiated by:  GRAVES-BIGELOW,Spyridon Hornstein  Subjective/Objective Assessment:   Pt admitted with SOB. Has been on milrinone since OCt 2012. Pt is from [place] and has family support.     Action/Plan:   Pt was active with [facility]-  for nursing and milrinone is supplied via [facility]. CM will continue to monitor for additional d/c needs. MD please write resumption orders for above services.   Anticipated DC Date:  01/11/2012   Anticipated DC Plan:  HOME W HOME HEALTH SERVICES      DC Planning Services  CM consult      [facility] Choice  Resumption Of Svcs/PTA Provider   Choice offered to / List presented to:  [name]        HH arranged  HH-1 RN  HH-10 DISEASE MANAGEMENT      Status of service:  Completed, signed off Medicare Important Message given?   (If response is "NO", the following Medicare IM given date fields will be blank) Date Medicare IM given:   Date Additional Medicare IM given:    Discharge Disposition:  HOME W HOME HEALTH SERVICES  Per UR Regulation:  Reviewed for med. necessity/level of care/duration of stay  If discussed at Long Length of Stay Meetings, dates discussed:    Comments:  01-10-12 [address] [place], [place] [phone] CM did speak to pt's daughter  Junious Dresser [phone]) and she will bring milrinone IV to the hospital today. CM called [facility] to assist with connection of milrinone and CM will call [facility]  [phone] to make them aware that pt will d/c today. CM will fax orders to agency fax # [phone].

## 2012-01-08 NOTE — Progress Notes (Signed)
  Echocardiogram 2D Echocardiogram has been performed.  Kelly Hart 01/08/2012, 3:38 PM

## 2012-01-08 NOTE — Progress Notes (Signed)
Patient admitted around 2 am by Dr. Terie Purser secondary to increased SOB especially on exertion; ha hx of chronic resp failure due to COPD and severe diastolic CHF; she is on oxygen 4L at baseline and also milrinone pump at home. So far cardiac enzymes are negative and good oxygen saturation on 4L at rest. Will follow 2-D echo and heart failure team consulted (Dr. Shirlee Latch) in order to help with decision making and medication adjustments.  Denies CP, fever, cough or any other acute complaints at this moment. CXR w/o acute cardiopulmonary process.  Kelly Hart [phone]

## 2012-01-08 NOTE — H&P (Signed)
PCP:   Kelly Hart,Kelly A, MD   Chief Complaint:  sob  HPI: 78 yo female with significant h/o copd, diastolic chf on milrinone pump at home since oct 2012, chronic oxygen dependency recently increased from 2L [place] cont to 4 L [place] comes in with wosening sob for several weeks with exertion.  She states that her heart fail symptoms improved for quite some time with the milrinone that was started almost Hart year ago and she was able to walk to her mailbox and back.  However over the last week or so her sob gets worse with walking to the mailbox.  Denies any fevers/cough/pnd/orthopnea/le edema.  No medicines have been changed except her cardiologist dr bensihmon inc her oxygen to 4 L and gave her Hart low dose of xanax for anxiety.  She says she hardly ever wheezes.  She is an Physicist, medical.  No childhood h/o asthma.  No cp.  Review of Systems:  O/w neg  Past Medical History: Past Medical History  Diagnosis Date  . Hypertension   . Renal insufficiency     baseline around cr 1.3  . Atrial fibrillation   . COPD (chronic obstructive pulmonary disease)   . CHF (congestive heart failure)     diastolic  . PEA (Pulseless electrical activity) Oct 2012  . PAD (peripheral artery disease)   . Hyperlipidemia   . Hypothyroidism   . History of cervical cancer   . Chronic diarrhea     secondary to treatment of her cervical cancer  . Hydronephrosis, left   . Pulmonary hypertension   . GERD (gastroesophageal reflux disease)   . Myocardial infarction    Past Surgical History  Procedure Date  . Aortobifem bypass grafting   . Open thrombectomy Oct 2012  . Pr vein bypass graft,aorto-fem-pop 02/04/2009    Medications: Prior to Admission medications   Medication Sig Start Date End Date Taking? Authorizing Provider  ALPRAZolam (XANAX) 0.25 MG tablet Take 0.25 mg by mouth at bedtime as needed. For sleep   Yes Historical Provider, MD  amiodarone (PACERONE) 200 MG tablet Take 200 mg by mouth daily.   Yes Historical  Provider, MD  amLODipine (NORVASC) 10 MG tablet Take 1 tablet (10 mg total) by mouth daily. 09/06/11 09/05/12 Yes Dolores Patty, MD  aspirin EC 81 MG tablet Take 81 mg by mouth daily.     Yes Historical Provider, MD  atorvastatin (LIPITOR) 40 MG tablet Take 40 mg by mouth daily. 05/03/11  Yes Dolores Patty, MD  digoxin (LANOXIN) 0.125 MG tablet Take 0.5 tablets (62.5 mcg total) by mouth daily. 10/11/11  Yes Amy D Clegg, NP  diphenhydrAMINE (BENADRYL) 25 mg capsule Take 25 mg by mouth at bedtime as needed. For sleep   Yes Historical Provider, MD  levothyroxine (SYNTHROID, LEVOTHROID) 150 MCG tablet Take 150 mcg by mouth daily.   Yes Historical Provider, MD  magnesium oxide (MAG-OX) 400 MG tablet Take 200 mg by mouth daily.  03/23/11 03/22/12 Yes Amy D Clegg, NP  milrinone (PRIMACOR) 200-5 MCG/ML-% infusion Inject 0.125 mcg/kg/min into the vein continuous. 03/07/11  Yes Dolores Patty, MD  Multiple Vitamins-Minerals (MULTIVITAMINS THER. W/MINERALS) TABS Take 1 tablet by mouth daily.     Yes Historical Provider, MD  pantoprazole (PROTONIX) 40 MG tablet Take 40 mg by mouth daily. 02/22/11  Yes Amy D Clegg, NP  Tadalafil, PAH, 20 MG TABS Take 0.5 tablets (10 mg total) by mouth daily. 09/24/11  Yes Amy Georgie Chard, NP  tiotropium (SPIRIVA)  18 MCG inhalation capsule Place 1 capsule (18 mcg total) into inhaler and inhale daily. 09/28/11 09/27/12 Yes Leslye Peer, MD    Allergies:   Allergies  Allergen Reactions  . Iodine Swelling  . Lisinopril Swelling    throat  . Omnipaque (Iohexol) Other (See Comments)    Unknown  . Penicillins Swelling    Social History:  reports that she quit smoking about Hart year ago. Her smoking use included Cigarettes. She has Hart 25 pack-year smoking history. She has never used smokeless tobacco. She reports that she does not drink alcohol or use illicit drugs.  Family History: Family History  Problem Relation Age of Onset  . Heart failure Brother   . Heart disease  Father   . Throat cancer Sister   . Emphysema Father     Physical Exam: Filed Vitals:   01/07/12 2222 01/07/12 2330 01/07/12 2345 01/08/12 0100  BP: 155/77 147/87 134/82 115/95  Pulse: 123 118 113 117  Temp: 97.7 F (36.5 C) 98 F (36.7 C)    TempSrc: Oral Oral    Resp: 19 21 19 27   SpO2: 90% 94% 95% 93%   General appearance: alert, cooperative and no distress Neck: no JVD Lungs: diminished breath sounds bibasilar Heart: regular rate and rhythm, S1, S2 normal, no murmur, click, rub or gallop Abdomen: soft, non-tender; bowel sounds normal; no masses,  no organomegaly Extremities: extremities normal, atraumatic, no cyanosis or edema Pulses: 2+ and symmetric Skin: Skin color, texture, turgor normal. No rashes or lesions Neurologic: Grossly normal    Labs on Admission:   [facility] 01/07/12 2240  NA 138  K 3.6  CL 103  CO2 22  GLUCOSE 136*  BUN 14  CREATININE 1.30*  CALCIUM 9.7  MG --  PHOS --    Basename 01/07/12 2240  AST 28  ALT 23  ALKPHOS 112  BILITOT 0.6  PROT 7.8  ALBUMIN 3.9    Basename 01/07/12 2240  WBC 7.8  NEUTROABS 5.5  HGB 12.3  HCT 36.5  MCV 87.3  PLT 187   Radiological Exams on Admission: Dg Chest 2 View  01/07/2012  *RADIOLOGY REPORT*  Clinical Data: 78 year old female shortness of breath.  CHEST - 2 VIEW  Comparison: 03/10/2011 and earlier.  Findings: Stable cardiomegaly and mediastinal contours.  Stable right PICC line.  Chronic coarse and widespread pulmonary markings have not significantly changed.  No pneumothorax or pulmonary edema.  No pleural effusion or new pulmonary opacity.  Calcified atherosclerosis projecting over the lung apices.  Osteopenia. Stable visualized osseous structures.  Right upper quadrant surgical clips.  IMPRESSION: Stable cardiomegaly and chronic lung disease.   Original Report Authenticated By: [name], [name]     Assessment/Plan Present on Admission:  78 yo female with subjective worsening doe who has  significant h/o diastolic chf and copd .SOB (shortness of breath) on exertion .COPD (chronic obstructive pulmonary disease) .RHF (right heart failure) .Pulmonary hypertension .PAD (peripheral artery disease) .HTN (hypertension)  Her heart failure seems to be compensated as well as her copd at this time.  Will place on tele and serial her cardiac enzymes.  ekg sinus tach.  Initial trop neg.  Cont her home milrinone pump.  Reck echo per her report has not been cked since oct 12.  cxr no infiltrate or evidence of pna.  Will likely need to consult her longterm cardiologist dr bensihmon tomorrow.     Curt Oatis Hart [phone] 01/08/2012, 2:10 AM

## 2012-01-08 NOTE — ED Notes (Signed)
Dr. Dierdre Highman made aware of pt's ABG results -   PH             7.4 PCO2        30.7 PO2           58 HCO3        20.8  No further orders given at this time.

## 2012-01-08 NOTE — ED Provider Notes (Signed)
History     CSN: [id number]  Arrival date & time 01/07/12  2219   None     Chief Complaint  Patient presents with  . Shortness of Breath    (Consider location/radiation/quality/duration/timing/severity/associated sxs/prior treatment) HPI HX per PT and daughter, has COPd and Pulm HTN, on 2L oxygen at home and Friday in clinic her pulmonologist Dr Delton Coombes recommended go upt o 4L O2 with activity. Not feeling well over the last week with more SOB and DOE. No F/C, no cough, no leg pain or swelling, no h/o DVT. She tells me that her heart rate normally runs high "around 114" no new medications, is feeling jittery today. Feeling more SOB since seeing DR Byrum. Mod in severity.  Past Medical History  Diagnosis Date  . Hypertension   . Renal insufficiency     baseline around cr 1.3  . Atrial fibrillation   . COPD (chronic obstructive pulmonary disease)   . CHF (congestive heart failure)     diastolic  . PEA (Pulseless electrical activity) Oct 2012  . PAD (peripheral artery disease)   . Hyperlipidemia   . Hypothyroidism   . History of cervical cancer   . Chronic diarrhea     secondary to treatment of her cervical cancer  . Hydronephrosis, left   . Pulmonary hypertension   . GERD (gastroesophageal reflux disease)   . Myocardial infarction     Past Surgical History  Procedure Date  . Aortobifem bypass grafting   . Open thrombectomy Oct 2012  . Pr vein bypass graft,aorto-fem-pop 02/04/2009    Family History  Problem Relation Age of Onset  . Heart failure Brother   . Heart disease Father   . Throat cancer Sister   . Emphysema Father     History  Substance Use Topics  . Smoking status: Former Smoker -- 0.5 packs/day for 50 years    Types: Cigarettes    Quit date: 01/15/2011  . Smokeless tobacco: Never Used  . Alcohol Use: No    OB History    Grav Para Term Preterm Abortions TAB SAB Ect Mult Living                  Review of Systems  Constitutional: Negative for  fever and chills.  HENT: Negative for neck pain and neck stiffness.   Eyes: Negative for pain.  Respiratory: Positive for shortness of breath. Negative for cough and wheezing.   Cardiovascular: Negative for chest pain.  Gastrointestinal: Negative for abdominal pain.  Genitourinary: Negative for dysuria.  Musculoskeletal: Negative for back pain.  Skin: Negative for rash.  Neurological: Negative for headaches.  All other systems reviewed and are negative.    Allergies  Iodine; Lisinopril; Omnipaque; and Penicillins  Home Medications   Current Outpatient Rx  Name Route Sig Dispense Refill  . ALPRAZOLAM 0.25 MG PO TABS Oral Take 0.25 mg by mouth at bedtime as needed. For sleep    . AMIODARONE HCL 200 MG PO TABS Oral Take 200 mg by mouth daily.    Marland Kitchen AMLODIPINE BESYLATE 10 MG PO TABS Oral Take 1 tablet (10 mg total) by mouth daily. 30 tablet 6  . ASPIRIN EC 81 MG PO TBEC Oral Take 81 mg by mouth daily.      . ATORVASTATIN CALCIUM 40 MG PO TABS Oral Take 40 mg by mouth daily.    Marland Kitchen DIGOXIN 0.125 MG PO TABS Oral Take 0.5 tablets (62.5 mcg total) by mouth daily. 15 tablet 6  . DIPHENHYDRAMINE  HCL 25 MG PO CAPS Oral Take 25 mg by mouth at bedtime as needed. For sleep    . LEVOTHYROXINE SODIUM 150 MCG PO TABS Oral Take 150 mcg by mouth daily.    Marland Kitchen MAGNESIUM OXIDE 400 MG PO TABS Oral Take 200 mg by mouth daily.     Marland Kitchen MILRINONE IN DEXTROSE 200-5 MCG/ML-% IV SOLN Intravenous Inject 0.125 mcg/kg/min into the vein continuous.    Carma Leaven M PLUS PO TABS Oral Take 1 tablet by mouth daily.      Marland Kitchen PANTOPRAZOLE SODIUM 40 MG PO TBEC Oral Take 40 mg by mouth daily.    Marland Kitchen TADALAFIL (PAH) 20 MG PO TABS Oral Take 0.5 tablets (10 mg total) by mouth daily. 30 tablet 3  . TIOTROPIUM BROMIDE MONOHYDRATE 18 MCG IN CAPS Inhalation Place 1 capsule (18 mcg total) into inhaler and inhale daily. 30 capsule 6    BP 147/87  Pulse 118  Temp 98 F (36.7 C) (Oral)  Resp 21  SpO2 94%  Physical Exam  Constitutional:  She is oriented to person, place, and time. She appears well-developed and well-nourished.  HENT:  Head: Normocephalic and atraumatic.  Eyes: Conjunctivae normal and EOM are normal. Pupils are equal, round, and reactive to light.  Neck: Trachea normal. Neck supple. No thyromegaly present.  Cardiovascular: Normal rate, regular rhythm, S1 normal, S2 normal and normal pulses.     No systolic murmur is present   No diastolic murmur is present  Pulses:      Radial pulses are 2+ on the right side, and 2+ on the left side.  Pulmonary/Chest: Effort normal. She has no wheezes. She has no rhonchi. She has no rales. She exhibits no tenderness.       Mild tachypnea with bilateral dec breath sounds  Abdominal: Soft. Normal appearance and bowel sounds are normal. There is no tenderness. There is no CVA tenderness and negative Murphy's sign.  Musculoskeletal:       BLE:s Calves nontender, no cords or erythema, negative Homans sign  Neurological: She is alert and oriented to person, place, and time. She has normal strength. No cranial nerve deficit or sensory deficit. GCS eye subscore is 4. GCS verbal subscore is 5. GCS motor subscore is 6.  Skin: Skin is warm and dry. No rash noted. She is not diaphoretic.  Psychiatric: Her speech is normal.       Cooperative and appropriate    ED Course  Procedures (including critical care time)  Results for orders placed during the hospital encounter of 01/07/12  CBC WITH DIFFERENTIAL      Component Value Range   WBC 7.8  4.0 - 10.5 K/uL   RBC 4.18  3.87 - 5.11 MIL/uL   Hemoglobin 12.3  12.0 - 15.0 g/dL   HCT 16.1  09.6 - 04.5 %   MCV 87.3  78.0 - 100.0 fL   MCH 29.4  26.0 - 34.0 pg   MCHC 33.7  30.0 - 36.0 g/dL   RDW 40.9  81.1 - 91.4 %   Platelets 187  150 - 400 K/uL   Neutrophils Relative 71  43 - 77 %   Neutro Abs 5.5  1.7 - 7.7 K/uL   Lymphocytes Relative 21  12 - 46 %   Lymphs Abs 1.6  0.7 - 4.0 K/uL   Monocytes Relative 8  3 - 12 %   Monocytes  Absolute 0.6  0.1 - 1.0 K/uL   Eosinophils Relative 1  0 -  5 %   Eosinophils Absolute 0.1  0.0 - 0.7 K/uL   Basophils Relative 0  0 - 1 %   Basophils Absolute 0.0  0.0 - 0.1 K/uL  COMPREHENSIVE METABOLIC PANEL      Component Value Range   Sodium 138  135 - 145 mEq/L   Potassium 3.6  3.5 - 5.1 mEq/L   Chloride 103  96 - 112 mEq/L   CO2 22  19 - 32 mEq/L   Glucose, Bld 136 (*) 70 - 99 mg/dL   BUN 14  6 - 23 mg/dL   Creatinine, Ser 0.86 (*) 0.50 - 1.10 mg/dL   Calcium 9.7  8.4 - 57.8 mg/dL   Total Protein 7.8  6.0 - 8.3 g/dL   Albumin 3.9  3.5 - 5.2 g/dL   AST 28  0 - 37 U/L   ALT 23  0 - 35 U/L   Alkaline Phosphatase 112  39 - 117 U/L   Total Bilirubin 0.6  0.3 - 1.2 mg/dL   GFR calc non Af Amer 39 (*) >90 mL/min   GFR calc Af Amer 45 (*) >90 mL/min  PRO B NATRIURETIC PEPTIDE      Component Value Range   Pro B Natriuretic peptide (BNP) 1049.0 (*) 0 - 450 pg/mL   Dg Chest 2 View  01/07/2012  *RADIOLOGY REPORT*  Clinical Data: 78 year old female shortness of breath.  CHEST - 2 VIEW  Comparison: 03/10/2011 and earlier.  Findings: Stable cardiomegaly and mediastinal contours.  Stable right PICC line.  Chronic coarse and widespread pulmonary markings have not significantly changed.  No pneumothorax or pulmonary edema.  No pleural effusion or new pulmonary opacity.  Calcified atherosclerosis projecting over the lung apices.  Osteopenia. Stable visualized osseous structures.  Right upper quadrant surgical clips.  IMPRESSION: Stable cardiomegaly and chronic lung disease.   Original Report Authenticated By: [name], [name]     Date: 01/08/2012  Rate: 124  Rhythm: sinus tachycardia  QRS Axis: normal  Intervals: normal  ST/T Wave abnormalities: nonspecific ST changes  Conduction Disutrbances:none  Narrative Interpretation:   Old EKG Reviewed: unchanged  Increased O2 requirements, stas drop to mid 80s during exam and around 90% 3L at rest. ABG ordered and MED consult. No clinical DVT.  Inc crt with h/o renal insuff.   Troponin 0.06  12:54 AM d/w Dr [name], plan admit tele team 10  MDM   VS, old records and nursing notes reviewed. Holding CTA PE study given renal insuff. O2 provided. MED consult. Labs reviewed. ECG as above.        Sunnie Nielsen, MD 01/08/12 [id number]

## 2012-01-08 NOTE — Consult Note (Signed)
CARDIOLOGY CONSULT NOTE  Patient ID: Kelly Hart MRN: [id number] DOB/AGE: [birth year] 78 y.o.  Admit date: 01/07/2012 Primary Cardiologist: Bensimhon Reason for Consultation: CHF  HPI: 78 year old AA female with of pulseless electrical activity arrest, cor pulmonale secondary to pulmonary arterial hypertension, chronic respiratory failure on home oxygen, multifocal atrial tachycardia, and peripheral arterial disease status post aortobifem bypass with acute occlusion of the right limb of the graft s/p thrombectomy by Dr. Arbie Cookey was admitted yesterday with increased exertional dyspnea.   01/2011 LHC showed minimal coronary artery disease with 30% in the right coronary artery and 40%in the distal PDA.  RHC showed moderate pulmonary arterial hypertension with improvement in cardiac index and PA pressure after milrinone begun.  05/10/11: Echo LV 50-55%. Grade 1 diastolic dysfunction. Ventricular septum: Septal motion showed "bounce", contour showed diastolic flattening and systolic flattening. RV moderately dilated with mildly reduced. RA mildly dilated. Severe TR. Pulmonary arteries with systolic pressure mod increased.   Kelly Hart had been stable for months on her current regimen of milrinone 0.25 mcg/kg/min and tadalafil 10.  She has not been on Lasix.  Over the last 2 wks, she has become steadily more short of breath with exertion. She has been short of breath just walking from the bed to the bathroom for the last couple of days so her daughter made her come to the ER.  She has stable orthopnea, sleeps at about 30 degrees of elevation.  No chest pain.  No syncope/lightheadedness.  No palpitations.  Her HR chronically runs high, appears to be sinus tachycardia rather than MAT or atypical flutter.  She has been afebrile, no cough, clear CXR.   Review of systems complete and found to be negative unless listed above in HPI  Past Medical History: 1. H/o PEA arrest 2. H/o MAT on amiodarone.  3. PAD  s/p aortobifemoral bypass with h/o acute occlusion of right limb of graft and s/p thrombectomy.  4. SBO 2/13.  5. Pulmonary arterial hypertension: Most likely secondary to underlying lung disease, possible significant COPD.  PFTs show mixed restrictive/obstructive picture with severely decreased DLCO.  Patient has cor pulmonale and is on home milrinone and tadalafil.  Last RHC on milrinone below: - Moderate pulmonary arterial hypertension.  - Pulmonary capillary wedge pressures are normal in the right upper  lobe, left upper lobe, and left lower lobe. However, in the right  lower lobe the pulmonary capillary wedge pressure is 10 mmHg higher  and has a significant gradient with the left ventricular end-  diastolic pressure which is not present in the other quadrants. The significance of this finding was uncertain as there was no evidence for pulmonary vein stenosis on pulmonary angiogram.  - Normal cardiac output on milrinone.  - No significant change in pulmonary pressure with adenosine. There  was a mild increase in pulmonary capillary wedge pressure with  vasoreactivity.  - Apparently, normal pulmonary angiography in all 4 quadrants with no  evidence of pulmonary vein stenosis. 6. Cor pulmonale: Echo (1/23) with EF 50-55%, D-shaped septum, moderately dilated RV with mildly decreased systolic function, severe TR, RV-RA gradient 48 mmHg.  7. COPD: 30+pack yr smoking history.  She is on home oxygen.  8. CAD: Nonobstructive in 10/12.    Family History  Problem Relation Age of Onset  . Heart failure Brother   . Heart disease Father   . Throat cancer Sister   . Emphysema Father     History   Social History  .  Marital Status: Divorced    Spouse Name: N/A    Number of Children: N/A  . Years of Education: N/A   Occupational History  . RETIRED     [employer]   Social History Main Topics  . Smoking status: Former Smoker -- 0.5 packs/day for 50 years    Types: Cigarettes    Quit date:  01/15/2011  . Smokeless tobacco: Never Used  . Alcohol Use: No  . Drug Use: No  . Sexually Active: Not on file   Other Topics Concern  . Not on file   Social History Narrative   She is widowed and lives alone.  She has 4 daughters who provide a lot of support.  She is a former Scientist, product/process development.     Prescriptions prior to admission  Medication Sig Dispense Refill  . ALPRAZolam (XANAX) 0.25 MG tablet Take 0.25 mg by mouth at bedtime as needed. For sleep      . amiodarone (PACERONE) 200 MG tablet Take 200 mg by mouth daily.      Marland Kitchen amLODipine (NORVASC) 10 MG tablet Take 1 tablet (10 mg total) by mouth daily.  30 tablet  6  . aspirin EC 81 MG tablet Take 81 mg by mouth daily.        Marland Kitchen atorvastatin (LIPITOR) 40 MG tablet Take 40 mg by mouth daily.      . digoxin (LANOXIN) 0.125 MG tablet Take 0.5 tablets (62.5 mcg total) by mouth daily.  15 tablet  6  . diphenhydrAMINE (BENADRYL) 25 mg capsule Take 25 mg by mouth at bedtime as needed. For sleep      . levothyroxine (SYNTHROID, LEVOTHROID) 150 MCG tablet Take 150 mcg by mouth daily.      . magnesium oxide (MAG-OX) 400 MG tablet Take 200 mg by mouth daily.       . Milrinone in Dextrose (MILRINONE-DEXTROSE IV) Inject 0.25 mcg/kg/min into the vein. Concentration: 1 mg/mL (140 mL bag)      . Multiple Vitamins-Minerals (MULTIVITAMINS THER. W/MINERALS) TABS Take 1 tablet by mouth daily.        . pantoprazole (PROTONIX) 40 MG tablet Take 40 mg by mouth daily.      . Tadalafil, PAH, 20 MG TABS Take 0.5 tablets (10 mg total) by mouth daily.  30 tablet  3  . tiotropium (SPIRIVA) 18 MCG inhalation capsule Place 1 capsule (18 mcg total) into inhaler and inhale daily.  30 capsule  6  . DISCONTD: milrinone (PRIMACOR) 200-5 MCG/ML-% infusion Inject 0.125 mcg/kg/min into the vein continuous.        Physical exam Blood pressure 136/69, pulse 126, temperature 98.1 F (36.7 C), temperature source Oral, resp. rate 19, height 5\' 4"  (1.626 m), weight 103 lb  (46.72 kg), SpO2 94.00%. General: NAD, thin Neck: JVP 8-9 cm, no thyromegaly or thyroid nodule.  Lungs: Crackles at bases bilaterally CV: Nondisplaced PMI.  Heart mildly tachy, regular S1/S2, no S3/S4, no murmur.  No peripheral edema.  No carotid bruit.  .  Abdomen: Soft, nontender, no hepatosplenomegaly, no distention.  Skin: Intact without lesions or rashes.  Neurologic: Alert and oriented x 3.  Psych: Normal affect. Extremities: No clubbing or cyanosis.  HEENT: Normal.   Labs:   Lab Results  Component Value Date   WBC 7.8 01/07/2012   HGB 12.3 01/07/2012   HCT 36.5 01/07/2012   MCV 87.3 01/07/2012   PLT 187 01/07/2012    Lab 01/07/12 2240  NA 138  K 3.6  CL  103  CO2 22  BUN 14  CREATININE 1.30*  CALCIUM 9.7  PROT 7.8  BILITOT 0.6  ALKPHOS 112  ALT 23  AST 28  GLUCOSE 136*   Lab Results  Component Value Date   TROPONINI <0.30 01/08/2012    Lab Results  Component Value Date   CHOL 101 05/12/2011   Digoxin 0.8 Pro-BNP 1049   Radiology: - CXR with stable cardiomegaly  EKG: Suspect sinus tachy at 124, less likely atypical flutter  Current telemetry with sinus tachy in the 100s  ASSESSMENT AND PLAN:  78 yo with secondary pulmonary arterial hypertension that appears to be due to chronic lung disease (probably COPD) and cor pulmonale on home milrinone was admitted with increased exertional dyspnea.  1. CHF: h/o cor pulmonale in the setting of pulmonary arterial HTN, on home milrinone.  She had done well on this regimen for a number of months.  WHO class IV symptoms currently.  I am uncertain what the change was triggering this decompensation.  She cannot pinpoint a particular event.  Weight is up about 4 lbs.   She continues milrinone at 0.25 mcg/kg/min.  - It is possible that she has some volume retention given weight gain with RV enlargement and septal shift leading to fall in LV output and worsening of symptoms.  Therefore, I will try gentle diuresis.  I will use  Lasix 40 mg IV bid for now.  Follow K and creatinine closely.  - She will need RHC tomorrow to assess her hemodynamics and determine if she actually should get the Lasix.   - I will get a co-ox off her central line.  - Echo has been ordered.  2. Pulmonary arterial hypertension: Secondary PAH, appears to be due to intrinsic lung disease (probably COPD).  She is on home oxygen, milrinone, and tadalafil.  Would consider increasing tadalafil to 20 mg daily, but will need RHC first to assess PCWP.   3. Rhythm: She chronically has mild sinus tachycardia.  She is in ST today.  Yesterday's ECG with rate in the 120s also probably is ST (rather than atrial tachy or atypical flutter).   Marca Ancona 01/08/2012, 1:18 PM

## 2012-01-09 ENCOUNTER — Encounter (HOSPITAL_COMMUNITY)
Admission: EM | Disposition: A | Discharge status: Home / Self Care | Payer: Self-pay | Source: Home / Self Care | Attending: Internal Medicine

## 2012-01-09 DIAGNOSIS — I279 Pulmonary heart disease, unspecified: Secondary | ICD-10-CM

## 2012-01-09 DIAGNOSIS — J449 Chronic obstructive pulmonary disease, unspecified: Secondary | ICD-10-CM

## 2012-01-09 DIAGNOSIS — I5033 Acute on chronic diastolic (congestive) heart failure: Secondary | ICD-10-CM | POA: Diagnosis present

## 2012-01-09 HISTORY — PX: RIGHT HEART CATHETERIZATION: SHX5447

## 2012-01-09 LAB — POCT I-STAT 3, VENOUS BLOOD GAS (G3P V)
Acid-base deficit: 2 mmol/L (ref 0.0–2.0)
Acid-base deficit: 2 mmol/L (ref 0.0–2.0)
Bicarbonate: 22 mEq/L (ref 20.0–24.0)
Bicarbonate: 22.9 mEq/L (ref 20.0–24.0)
TCO2: 24 mmol/L (ref 0–100)
pCO2, Ven: 36.4 mmHg — ABNORMAL LOW (ref 45.0–50.0)
pH, Ven: 7.406 — ABNORMAL HIGH (ref 7.250–7.300)
pO2, Ven: 36 mmHg (ref 30.0–45.0)

## 2012-01-09 LAB — POCT I-STAT 3, ART BLOOD GAS (G3+)
Acid-base deficit: 3 mmol/L — ABNORMAL HIGH (ref 0.0–2.0)
Bicarbonate: 20.8 mEq/L (ref 20.0–24.0)
O2 Saturation: 91 %
O2 Saturation: 90 %
Patient temperature: 98.6
pCO2 arterial: 28.6 mmHg — ABNORMAL LOW (ref 35.0–45.0)
pH, Arterial: 7.24 — ABNORMAL LOW (ref 7.350–7.450)

## 2012-01-09 LAB — PROTIME-INR: Prothrombin Time: 15.4 seconds — ABNORMAL HIGH (ref 11.6–15.2)

## 2012-01-09 LAB — POCT I-STAT TROPONIN I: Troponin i, poc: 0.06 ng/mL (ref 0.00–0.08)

## 2012-01-09 LAB — BASIC METABOLIC PANEL
Chloride: 108 mEq/L (ref 96–112)
Creatinine, Ser: 1.4 mg/dL — ABNORMAL HIGH (ref 0.50–1.10)
GFR calc Af Amer: 41 mL/min — ABNORMAL LOW (ref >90)
Potassium: 3.9 mEq/L (ref 3.5–5.1)

## 2012-01-09 SURGERY — RIGHT HEART CATH
Anesthesia: Moderate Sedation

## 2012-01-09 MED ORDER — SODIUM CHLORIDE 0.9 % IJ SOLN: 3.0000 mL | INTRAMUSCULAR | Status: DC | PRN

## 2012-01-09 MED ORDER — SODIUM CHLORIDE 0.9 % IJ SOLN: 3.0000 mL | Freq: Two times a day (BID) | INTRAMUSCULAR | Status: DC

## 2012-01-09 MED ORDER — FUROSEMIDE 20 MG PO TABS
20.0000 mg | ORAL_TABLET | Freq: Every day | ORAL | Status: DC
  Administered 2012-01-10: 20 mg via ORAL
  Filled 2012-01-09: qty 1

## 2012-01-09 MED ORDER — HEPARIN (PORCINE) IN NACL 2-0.9 UNIT/ML-% IJ SOLN
INTRAMUSCULAR | Status: AC
  Filled 2012-01-09: qty 500

## 2012-01-09 MED ORDER — SODIUM CHLORIDE 0.9 % IV SOLN: 250.0000 mL | INTRAVENOUS | Status: DC | PRN

## 2012-01-09 MED ORDER — LIDOCAINE HCL (PF) 1 % IJ SOLN
INTRAMUSCULAR | Status: AC
  Filled 2012-01-09: qty 30

## 2012-01-09 MED ORDER — ACETAMINOPHEN 325 MG PO TABS: 650.0000 mg | ORAL_TABLET | ORAL | Status: DC | PRN

## 2012-01-09 MED ORDER — TADALAFIL (PAH) 20 MG PO TABS
20.0000 mg | ORAL_TABLET | Freq: Every day | ORAL | Status: DC
  Filled 2012-01-09: qty 1

## 2012-01-09 MED ORDER — MIDAZOLAM HCL 2 MG/2ML IJ SOLN
INTRAMUSCULAR | Status: AC
  Filled 2012-01-09: qty 2

## 2012-01-09 MED ORDER — FENTANYL CITRATE 0.05 MG/ML IJ SOLN
INTRAMUSCULAR | Status: AC
  Filled 2012-01-09: qty 2

## 2012-01-09 MED ORDER — ONDANSETRON HCL 4 MG/2ML IJ SOLN: 4.0000 mg | Freq: Four times a day (QID) | INTRAMUSCULAR | Status: DC | PRN

## 2012-01-09 MED ORDER — NITROGLYCERIN 0.2 MG/ML ON CALL CATH LAB
INTRAVENOUS | Status: AC
  Filled 2012-01-09: qty 1

## 2012-01-09 NOTE — Progress Notes (Signed)
[facility] PROGRESS NOTE  Kelly Hart JWJ:[id number] DOB: [birth year] DOA: 01/07/2012 PCP: Toma Deiters, MD  Assessment/Plan: Principal Problem:  *Pulmonary hypertension Active Problems:  PAD (peripheral artery disease)  RHF (right heart failure)  COPD (chronic obstructive pulmonary disease)  HTN (hypertension)  Acute on chronic diastolic heart failure  1. Pulmonary arterial hypertension:  PFTs show mixed restrictive/obstructive picture with severely decreased DLCO. Patient has cor pulmonale and is on home milrinone and tadalafil. Right heart cath showing fairly stable situation. Dr. Gala Romney   Is planning increase in tadalafil  2. Copd with 30 yrs hx of tobacco abuse 3. HTN 4. Diasotlic CHF - better after diuresis    Code Status: full Family Communication: daughter Disposition Plan: home    Brief narrative: 78 yo female with significant h/o copd, diastolic chf on milrinone pump at home since oct 2012, chronic oxygen dependency recently increased from 2L [place] cont to 4 L Kelly Hart comes in with wosening sob for several weeks with exertion. She states that her heart fail symptoms improved for quite some time with the milrinone that was started almost a year ago and she was able to walk to her mailbox and back. However over the last week or so her sob gets worse with walking to the mailbox. Denies any fevers/cough/pnd/orthopnea/le edema. No medicines have been changed except her cardiologist dr bensihmon inc her oxygen to 4 L and gave her a low dose of xanax for anxiety. She says she hardly ever wheezes. She is an Physicist, medical. No childhood h/o asthma. No cp   Consultants:  Cardiology  Procedures:  R heart cath  Antibiotics:  none  HPI/Subjective: Feels OK. Flat in bed   Objective: Filed Vitals:   01/08/12 1400 01/08/12 2100 01/09/12 0500 01/09/12 1415  BP: 115/63 111/54 119/70   Pulse: 108 111 103 126  Temp: 97.6 F (36.4 C) 97.9 F (36.6 C) 98.3 F (36.8 C)     TempSrc: Oral Oral Oral   Resp: 24     Height:      Weight:   49.896 kg (110 lb)   SpO2: 93% 96% 96%     Intake/Output Summary (Last 24 hours) at 01/09/12 1604 Last data filed at 01/09/12 0900  Gross per 24 hour  Intake    240 ml  Output    425 ml  Net   -185 ml   Filed Weights   01/08/12 0252 01/09/12 0500  Weight: 46.72 kg (103 lb) 49.896 kg (110 lb)    Exam:   General:  axox3  Cardiovascular: rrr  Respiratory: ctab  Abdomen: soft, nt  Data Reviewed: Basic Metabolic Panel:  Lab 01/09/12 6213 01/07/12 2240  NA 142 138  K 3.9 3.6  CL 108 103  CO2 23 22  GLUCOSE 97 136*  BUN 14 14  CREATININE 1.40* 1.30*  CALCIUM 8.9 9.7  MG -- --  PHOS -- --   Liver Function Tests:  Lab 01/07/12 2240  AST 28  ALT 23  ALKPHOS 112  BILITOT 0.6  PROT 7.8  ALBUMIN 3.9   No results found for this basename: LIPASE:5,AMYLASE:5 in the last 168 hours No results found for this basename: AMMONIA:5 in the last 168 hours CBC:  Lab 01/07/12 2240  WBC 7.8  NEUTROABS 5.5  HGB 12.3  HCT 36.5  MCV 87.3  PLT 187   Cardiac Enzymes:  Lab 01/08/12 1530 01/08/12 0915 01/08/12 0320  CKTOTAL -- -- --  CKMB -- -- --  CKMBINDEX -- -- --  TROPONINI <0.30 <0.30 <0.30   BNP (last 3 results)  Basename 01/07/12 2229 02/06/11 0500 02/03/11 0433  PROBNP 1049.0* 18370.0* 19995.0*   CBG: No results found for this basename: GLUCAP:5 in the last 168 hours  No results found for this or any previous visit (from the past 240 hour(s)).   Studies: Dg Chest 2 View  01/07/2012  *RADIOLOGY REPORT*  Clinical Data: 78 year old female shortness of breath.  CHEST - 2 VIEW  Comparison: 03/10/2011 and earlier.  Findings: Stable cardiomegaly and mediastinal contours.  Stable right PICC line.  Chronic coarse and widespread pulmonary markings have not significantly changed.  No pneumothorax or pulmonary edema.  No pleural effusion or new pulmonary opacity.  Calcified atherosclerosis projecting over  the lung apices.  Osteopenia. Stable visualized osseous structures.  Right upper quadrant surgical clips.  IMPRESSION: Stable cardiomegaly and chronic lung disease.   Original Report Authenticated By: [name], [name]     Scheduled Meds:    . amiodarone  200 mg Oral Daily  . amLODipine  10 mg Oral Daily  . aspirin  324 mg Oral Pre-Cath  . aspirin EC  81 mg Oral Daily  . atorvastatin  40 mg Oral Daily  . digoxin  0.0625 mg Oral Daily  . fentaNYL      . furosemide  20 mg Oral Daily  . heparin      . levothyroxine  150 mcg Oral Q breakfast  . lidocaine      . midazolam      . nitroGLYCERIN      . pantoprazole  40 mg Oral Daily  . potassium chloride  20 mEq Oral BID  . sodium chloride  10-40 mL Intracatheter Q12H  . sodium chloride  3 mL Intravenous Q12H  . sodium chloride  3 mL Intravenous Q12H  . sodium chloride  3 mL Intravenous Q12H  . Tadalafil (PAH)  20 mg Oral Daily  . tiotropium  18 mcg Inhalation Daily  . DISCONTD: furosemide  40 mg Intravenous BID  . DISCONTD: potassium chloride  20 mEq Oral BID  . DISCONTD: sodium chloride  3 mL Intravenous Q12H  . DISCONTD: Tadalafil (PAH)  10 mg Oral Daily   Continuous Infusions:    . milrinone 0.25 mcg/kg/min (01/09/12 0227)    Principal Problem:  *Pulmonary hypertension Active Problems:  PAD (peripheral artery disease)  RHF (right heart failure)  COPD (chronic obstructive pulmonary disease)  HTN (hypertension)  Acute on chronic diastolic heart failure        Kelly Hart  [facility] Pager [phone]. If 8PM-8AM, please contact night-coverage at [email or web address], password [facility] 01/09/2012, 4:04 PM  LOS: 2 days

## 2012-01-09 NOTE — Consult Note (Signed)
Kelly Hart is a 78 year old AA female with of pulseless electrical activity arrest, cor pulmonale secondary to pulmonary arterial hypertension, chronic respiratory failure on home oxygen, multifocal atrial tachycardia, and peripheral arterial disease status post aortobifem bypass.  01/2011 LHC showed minimal coronary artery disease with 30% in the right coronary artery and 40%in the distal PDA.  RHC showed moderate pulmonary arterial hypertension with improvement in cardiac index and PA pressure after milrinone begun.  05/10/11: Echo LV 50-55%. Grade 1 diastolic dysfunction. Ventricular septum: Septal motion showed "bounce", contour showed diastolic flattening and systolic flattening. RV moderately dilated with mildly reduced. RA mildly dilated. Severe TR. Pulmonary arteries with systolic pressure mod increased.   She was admitted with increasing dyspnea. Weight was up to 104-105 (baseline ~98 pounds). She received lasix x 1 and feels much better but says she is still quite dyspneic with any activity. Echo reviewed this am. She has evidence of cor pulmonale with RVSP in 80s.   Past Medical History: 1. H/o PEA arrest 2. H/o MAT on amiodarone.  3. PAD s/p aortobifemoral bypass with h/o acute occlusion of right limb of graft and s/p thrombectomy.  4. SBO 2/13.  5. Pulmonary arterial hypertension: Most likely secondary to underlying lung disease, possible significant COPD.  PFTs show mixed restrictive/obstructive picture with severely decreased DLCO.  Patient has cor pulmonale and is on home milrinone and tadalafil.  Last RHC on milrinone below: - Moderate pulmonary arterial hypertension.  - Pulmonary capillary wedge pressures are normal in the right upper  lobe, left upper lobe, and left lower lobe. However, in the right  lower lobe the pulmonary capillary wedge pressure is 10 mmHg higher  and has a significant gradient with the left ventricular end-  diastolic pressure which is not present in  the other quadrants. The significance of this finding was uncertain as there was no evidence for pulmonary vein stenosis on pulmonary angiogram.  - Normal cardiac output on milrinone.  - No significant change in pulmonary pressure with adenosine. There  was a mild increase in pulmonary capillary wedge pressure with  vasoreactivity.  - Apparently, normal pulmonary angiography in all 4 quadrants with no  evidence of pulmonary vein stenosis. 6. Cor pulmonale: Echo (1/23) with EF 50-55%, D-shaped septum, moderately dilated RV with mildly decreased systolic function, severe TR, RV-RA gradient 48 mmHg.  7. COPD: 30+pack yr smoking history.  She is on home oxygen.  8. CAD: Nonobstructive in 10/12.    Family History  Problem Relation Age of Onset  . Heart failure Brother   . Heart disease Father   . Throat cancer Sister   . Emphysema Father     History   Social History  . Marital Status: Divorced    Spouse Name: N/A    Number of Children: N/A  . Years of Education: N/A   Occupational History  . RETIRED     [employer]   Social History Main Topics  . Smoking status: Former Smoker -- 0.5 packs/day for 50 years    Types: Cigarettes    Quit date: 01/15/2011  . Smokeless tobacco: Never Used  . Alcohol Use: No  . Drug Use: No  . Sexually Active: Not on file   Other Topics Concern  . Not on file   Social History Narrative   She is widowed and lives alone.  She has 4 daughters who provide a lot of support.  She is a former Scientist, product/process development.     Prescriptions  prior to admission  Medication Sig Dispense Refill  . ALPRAZolam (XANAX) 0.25 MG tablet Take 0.25 mg by mouth at bedtime as needed. For sleep      . amiodarone (PACERONE) 200 MG tablet Take 200 mg by mouth daily.      Marland Kitchen amLODipine (NORVASC) 10 MG tablet Take 1 tablet (10 mg total) by mouth daily.  30 tablet  6  . aspirin EC 81 MG tablet Take 81 mg by mouth daily.        Marland Kitchen atorvastatin (LIPITOR) 40 MG tablet Take 40 mg by mouth  daily.      . digoxin (LANOXIN) 0.125 MG tablet Take 0.5 tablets (62.5 mcg total) by mouth daily.  15 tablet  6  . diphenhydrAMINE (BENADRYL) 25 mg capsule Take 25 mg by mouth at bedtime as needed. For sleep      . levothyroxine (SYNTHROID, LEVOTHROID) 150 MCG tablet Take 150 mcg by mouth daily.      . magnesium oxide (MAG-OX) 400 MG tablet Take 200 mg by mouth daily.       . Milrinone in Dextrose (MILRINONE-DEXTROSE IV) Inject 0.25 mcg/kg/min into the vein. Concentration: 1 mg/mL (140 mL bag)      . Multiple Vitamins-Minerals (MULTIVITAMINS THER. W/MINERALS) TABS Take 1 tablet by mouth daily.        . pantoprazole (PROTONIX) 40 MG tablet Take 40 mg by mouth daily.      . Tadalafil, PAH, 20 MG TABS Take 0.5 tablets (10 mg total) by mouth daily.  30 tablet  3  . tiotropium (SPIRIVA) 18 MCG inhalation capsule Place 1 capsule (18 mcg total) into inhaler and inhale daily.  30 capsule  6  . DISCONTD: milrinone (PRIMACOR) 200-5 MCG/ML-% infusion Inject 0.125 mcg/kg/min into the vein continuous.        Physical exam Blood pressure 119/70, pulse 103, temperature 98.3 F (36.8 C), temperature source Oral, resp. rate 24, height 5\' 4"  (1.626 m), weight 49.896 kg (110 lb), SpO2 96.00%.  I reweighed her personally she is 99 pounds General: NAD, thin Neck: JVP (hard to see)  7 cm, no thyromegaly or thyroid nodule.  Lungs: Crackles at bases bilaterally CV: Nondisplaced PMI.  Heart mildly tachy, regular S1/S2, no S3/S4, no murmur.  No peripheral edema.  No carotid bruit.  .  Abdomen: Soft, nontender, no hepatosplenomegaly, no distention.  Skin: Intact without lesions or rashes.  Neurologic: Alert and oriented x 3.  Psych: Normal affect. Extremities: No clubbing or cyanosis.  HEENT: Normal.   Labs:   Lab Results  Component Value Date   WBC 7.8 01/07/2012   HGB 12.3 01/07/2012   HCT 36.5 01/07/2012   MCV 87.3 01/07/2012   PLT 187 01/07/2012     Lab 01/09/12 0500 01/07/12 2240  NA 142 --  K 3.9 --    CL 108 --  CO2 23 --  BUN 14 --  CREATININE 1.40* --  CALCIUM 8.9 --  PROT -- 7.8  BILITOT -- 0.6  ALKPHOS -- 112  ALT -- 23  AST -- 28  GLUCOSE 97 --   Lab Results  Component Value Date   TROPONINI <0.30 01/08/2012    Lab Results  Component Value Date   CHOL 101 05/12/2011   Digoxin 0.8 Pro-BNP 1049   Radiology: - CXR with stable cardiomegaly  EKG: Suspect sinus tachy at 124, less likely atypical flutter  Current telemetry with sinus tachy in the lows 100s  ASSESSMENT 1. Increasing dyspnea 2. A/c diastolic HF  3. PAH with cor pulmonale 4. Chronic renal failure - stable 5. PAD 6. COPD  PLAN/DISCUSSION:  I have reviewed her echo and studies again. This is a very difficult case. She has end-stage cor pulmonale but has done well on milrinone and low-dose Adcirca (had trouble with higher doses). I suspect a big part of her decompensation was due to volume overload (she was 6 pounds up from baseline on admit). However she has diuresed and although symptoms improved she still feels quite limited. I agree with plan for RHC today to assess for worsening PAH or low output (or both). Of note, with previous vasodilator challenge her wedge went up significantly and we were reluctant to consider Flolan or high-dose vasodilators. We may have to revisit this.   Time spent reviewing studies and discussing with her and family 45 mins.   Daniel Bensimhon 01/09/2012, 8:03 AM

## 2012-01-09 NOTE — CV Procedure (Signed)
Cardiac Cath Procedure Note:  Indication:   Procedures performed:  1) Right heart catheterization  Description of procedure: Dyspnea/pulmonary HTN  The risks and indication of the procedure were explained. Consent was signed and placed on the chart. An appropriate timeout was taken prior to the procedure. The right groin was prepped and draped in the routine sterile fashion and anesthetized with 1% local lidocaine.   A 7 FR venous sheath was placed in the right femoral vein using a modified Seldinger technique. A standard Swan-Ganz catheter was used for the procedure.   Complications: None apparent.  Findings:  Done on milrinone 0.59mcg/kg/min  RA =  4 RV =  56/1/5 PA =  66/24 (45) LPCW = 13 RPCW = 11 Fick cardiac output/index = 5.6/3.8 PVR = 8.0 Woods FA sat = 90% PA sat = 69%, 70% SVC sat = 69%  Assessment: 1. Moderate PAH with normal left sided filling pressures and cardiac output on milrinone   Plan/Discussion:  Suspect her increased dyspnea on admit was due to volume overload. She has moderate PAH of unclear etiology. I am impressed ho low her RA pressures are despite the degree of RV dysfunction on echo. Will attempt to increase Adcirca to 20mg  daily.  Kelly Hart 3:10 PM

## 2012-01-10 DIAGNOSIS — I5033 Acute on chronic diastolic (congestive) heart failure: Secondary | ICD-10-CM

## 2012-01-10 LAB — BASIC METABOLIC PANEL
BUN: 17 mg/dL (ref 6–23)
GFR calc non Af Amer: 35 mL/min — ABNORMAL LOW (ref >90)
Glucose, Bld: 143 mg/dL — ABNORMAL HIGH (ref 70–99)
Potassium: 3.9 mEq/L (ref 3.5–5.1)

## 2012-01-10 MED ORDER — AMLODIPINE BESYLATE 5 MG PO TABS: 5.0000 mg | ORAL_TABLET | Freq: Every day | ORAL | Status: DC

## 2012-01-10 MED ORDER — FUROSEMIDE 20 MG PO TABS: 20.0000 mg | ORAL_TABLET | Freq: Every day | ORAL | Status: DC

## 2012-01-10 MED ORDER — TADALAFIL (PAH) 20 MG PO TABS: 20.0000 mg | ORAL_TABLET | Freq: Every day | ORAL | Status: DC

## 2012-01-10 NOTE — Progress Notes (Signed)
Advanced Heart Failure Rounding Note   Subjective:    Kelly Hart is a 78 year old AA female with of pulseless electrical activity arrest, cor pulmonale secondary to pulmonary arterial hypertension, chronic respiratory failure on home oxygen, multifocal atrial tachycardia, and peripheral arterial disease status post aortobifem bypass.  Admitted for increased dyspnea.  RHC 9/25 on milrinone  RA = 4  RV = 56/1/5  PA = 66/24 (45)  LPCW = 13  RPCW = 11  Fick cardiac output/index = 5.6/3.8  PVR = 8.0 Woods  FA sat = 90%  PA sat = 69%, 70%  SVC sat = 69%  Adcirca increased 20 mg daily.      Objective:   Weight Range:  Vital Signs:   Temp:  [97.6 F (36.4 C)-98.5 F (36.9 C)] 98.2 F (36.8 C) (09/26 0500) Pulse Rate:  [103-126] 103  (09/26 0500) Resp:  [18-20] 18  (09/26 0500) BP: (105-120)/(44-89) 105/44 mmHg (09/26 0500) SpO2:  [91 %-98 %] 95 % (09/26 0500) Weight:  [44.997 kg (99 lb 3.2 oz)] 44.997 kg (99 lb 3.2 oz) (09/26 0500) Last BM Date: 01/09/12  Weight change: Filed Weights   01/08/12 0252 01/09/12 0500 01/10/12 0500  Weight: 46.72 kg (103 lb) 49.896 kg (110 lb) 44.997 kg (99 lb 3.2 oz)    Intake/Output:   Intake/Output Summary (Last 24 hours) at 01/10/12 0823 Last data filed at 01/09/12 1700  Gross per 24 hour  Intake    240 ml  Output      0 ml  Net    240 ml     Physical Exam: General:  Well appearing. No resp difficulty HEENT: normal Neck: supple. JVP . Carotids 2+ bilat; no bruits. No lymphadenopathy or thryomegaly appreciated. Cor: PMI nondisplaced. Regular rate & rhythm. No rubs, gallops or murmurs. Lungs: clear Abdomen: soft, nontender, nondistended. No hepatosplenomegaly. No bruits or masses. Good bowel sounds. Extremities: no cyanosis, clubbing, rash, edema Neuro: alert & orientedx3, cranial nerves grossly intact. moves all 4 extremities w/o difficulty. Affect pleasant  Telemetry: Sinus tach 100-110  Labs: Basic Metabolic Panel:  Lab  01/09/12 0500 01/07/12 2240  NA 142 138  K 3.9 3.6  CL 108 103  CO2 23 22  GLUCOSE 97 136*  BUN 14 14  CREATININE 1.40* 1.30*  CALCIUM 8.9 9.7  MG -- --  PHOS -- --    Liver Function Tests:  Lab 01/07/12 2240  AST 28  ALT 23  ALKPHOS 112  BILITOT 0.6  PROT 7.8  ALBUMIN 3.9   No results found for this basename: LIPASE:5,AMYLASE:5 in the last 168 hours No results found for this basename: AMMONIA:3 in the last 168 hours  CBC:  Lab 01/07/12 2240  WBC 7.8  NEUTROABS 5.5  HGB 12.3  HCT 36.5  MCV 87.3  PLT 187    Cardiac Enzymes:  Lab 01/08/12 1530 01/08/12 0915 01/08/12 0320  CKTOTAL -- -- --  CKMB -- -- --  CKMBINDEX -- -- --  TROPONINI <0.30 <0.30 <0.30    BNP: BNP (last 3 results)  Basename 01/07/12 2229 02/06/11 0500 02/03/11 0433  PROBNP 1049.0* 18370.0* 19995.0*     Other results:  EKG:   Imaging:  No results found.   Medications:     Scheduled Medications:    . amiodarone  200 mg Oral Daily  . amLODipine  10 mg Oral Daily  . aspirin EC  81 mg Oral Daily  . atorvastatin  40 mg Oral Daily  . digoxin  0.0625 mg Oral Daily  . fentaNYL      . furosemide  20 mg Oral Daily  . heparin      . levothyroxine  150 mcg Oral Q breakfast  . lidocaine      . midazolam      . nitroGLYCERIN      . pantoprazole  40 mg Oral Daily  . potassium chloride  20 mEq Oral BID  . sodium chloride  10-40 mL Intracatheter Q12H  . sodium chloride  3 mL Intravenous Q12H  . sodium chloride  3 mL Intravenous Q12H  . sodium chloride  3 mL Intravenous Q12H  . Tadalafil (PAH)  20 mg Oral Daily  . tiotropium  18 mcg Inhalation Daily  . DISCONTD: furosemide  40 mg Intravenous BID  . DISCONTD: sodium chloride  3 mL Intravenous Q12H  . DISCONTD: Tadalafil (PAH)  10 mg Oral Daily     Infusions:    . milrinone 0.25 mcg/kg/min (01/10/12 0132)     PRN Medications:  sodium chloride, sodium chloride, acetaminophen, albuterol, diphenhydrAMINE, ondansetron  (ZOFRAN) IV, sodium chloride, sodium chloride, sodium chloride, DISCONTD: sodium chloride, DISCONTD: sodium chloride   Assessment:   1. A/c diastolic HF  2. Moderate PAH with cor pulmonale  3. Chronic renal failure - stable  4. PAD  5. COPD  Plan/Discussion:    Doing very well. Will increase tadalafil to 20mg  daily. (currently taking half tablet and we will have her take full tablet). Would restart lasix at 20mg  every Monday and Friday (we had to stop in past due to hypotension).  Can go home today. Appreciate [facility] assistance. We will see her in clinic next week.    Length of Stay: 3  Robbi Garter, [facility] 01/10/2012, 8:23 AM  Patient seen and examined with Ulyess Blossom, PA-C. We discussed all aspects of the encounter. I agree with the assessment and plan as stated above.   Cath results reviewed with her. Agree with increasing tadalafil and restarting low-dose lasix with carefull f/u of her weights and renal function. Given normal RA pressures and cardiac output, I would not consider Flolan at this point and I fell milrinone is the better option. Can move to combination therapy for PAH at some point as needed.   Adeli Frost,MD 8:57 AM

## 2012-01-10 NOTE — Discharge Instructions (Addendum)
Home Health Services arranged with [facility] [phone].  Registered Nurse

## 2012-01-10 NOTE — Discharge Summary (Signed)
Physician Discharge Summary  Kelly Hart YNW:[id number] DOB: 21-Jan-1933 DOA: 01/07/2012  PCP: Kelly Deiters, MD  Admit date: 01/07/2012 Discharge date: 01/10/2012  Discharge Diagnoses:  Principal Problem:  *Pulmonary hypertension Active Problems:  PAD (peripheral artery disease)  RHF (right heart failure)  COPD (chronic obstructive pulmonary disease)  HTN (hypertension)  Acute on chronic diastolic heart failure   Discharge Condition: back to baseline   Diet recommendation: heart healthy   Filed Weights   01/08/12 0252 01/09/12 0500 01/10/12 0500  Weight: 46.72 kg (103 lb) 49.896 kg (110 lb) 44.997 kg (99 lb 3.2 oz)    History of present illness:  78 yo female with significant h/o copd, diastolic chf on milrinone pump at home since oct 2012, chronic oxygen dependency recently increased from 2L [place] cont to 4 L [place] comes in with wosening sob for several weeks with exertion. She states that her heart fail symptoms improved for quite some time with the milrinone that was started almost a year ago and she was able to walk to her mailbox and back. However over the last week or so her sob gets worse with walking to the mailbox. Denies any fevers/cough/pnd/orthopnea/le edema. No medicines have been changed except her cardiologist dr bensihmon inc her oxygen to 4 L and gave her a low dose of xanax for anxiety. She says she hardly ever wheezes. She is an Physicist, medical. No childhood h/o asthma. No cp   Hospital Course:  1. Pulmonary arterial hypertension:  Patient has cor pulmonale and is on home milrinone and tadalafil. She did improve with diuresis. Right heart cath showing fairly stable situation. Kelly Hart recommended increase in tadalafil to 20 mg daily and to start lasix 20 mg twice a week.  2. Copd with 30 yrs hx of tobacco abuse - to continue home oxygen and Spiriva as tolerated  3. HTN - remained stable  4. Diasotlic CHF - she presented with increase dyspnea and weight gain . She was  given iv lasix and felt better after diuresis . 5. Hypokalemia due to diuretics - repleted        Procedures:  Right heart cath   9/25 on milrinone  RA = 4  RV = 56/1/5  PA = 66/24 (45)  LPCW = 13  RPCW = 11  Fick cardiac output/index = 5.6/3.8  PVR = 8.0 Woods  FA sat = 90%  PA sat = 69%, 70%  SVC sat = 69%      Consultations:  Cardiology - Kelly Hart   Discharge Exam: Filed Vitals:   01/09/12 1700 01/09/12 1800 01/09/12 2100 01/10/12 0500  BP: 120/80 113/69 112/60 105/44  Pulse: 104 112 115 103  Temp: 97.6 F (36.4 C) 98.3 F (36.8 C) 98.5 F (36.9 C) 98.2 F (36.8 C)  TempSrc:   Oral Oral  Resp:   20 18  Height:      Weight:    44.997 kg (99 lb 3.2 oz)  SpO2: 92% 98% 91% 95%    General: axox3 Cardiovascular: RRR Respiratory: CTAB  Discharge Instructions      Discharge Orders    Future Appointments: Provider: Department: Dept Phone: Center:   01/16/2012 11:45 AM [facility] [phone] None   02/27/2012 1:30 PM Kelly Peer, MD [facility] [phone] None     Future Orders Please Complete By Expires   Diet - low sodium heart healthy      Increase activity slowly  Medication List     As of 01/10/2012  1:58 PM    TAKE these medications         ALPRAZolam 0.25 MG tablet   Commonly known as: XANAX   Take 0.25 mg by mouth at bedtime as needed. For sleep      amiodarone 200 MG tablet   Commonly known as: PACERONE   Take 200 mg by mouth daily.      amLODipine 5 MG tablet   Commonly known as: NORVASC   Take 1 tablet (5 mg total) by mouth daily.      aspirin EC 81 MG tablet   Take 81 mg by mouth daily.      atorvastatin 40 MG tablet   Commonly known as: LIPITOR   Take 40 mg by mouth daily.      digoxin 0.125 MG tablet   Commonly known as: LANOXIN   Take 0.5 tablets (62.5 mcg total) by mouth daily.      diphenhydrAMINE 25 mg capsule   Commonly known as: BENADRYL   Take 25 mg by mouth at  bedtime as needed. For sleep      furosemide 20 MG tablet   Commonly known as: LASIX   Take 1 tablet (20 mg total) by mouth daily.      levothyroxine 150 MCG tablet   Commonly known as: SYNTHROID, LEVOTHROID   Take 150 mcg by mouth daily.      magnesium oxide 400 MG tablet   Commonly known as: MAG-OX   Take 200 mg by mouth daily.      MILRINONE-DEXTROSE IV   Inject 0.25 mcg/kg/min into the vein. Concentration: 1 mg/mL (140 mL bag)      multivitamins ther. w/minerals Tabs   Take 1 tablet by mouth daily.      pantoprazole 40 MG tablet   Commonly known as: PROTONIX   Take 40 mg by mouth daily.      Tadalafil (PAH) 20 MG Tabs   Take 1 tablet (20 mg total) by mouth daily.      tiotropium 18 MCG inhalation capsule   Commonly known as: SPIRIVA   Place 1 capsule (18 mcg total) into inhaler and inhale daily.        Follow-up Information    Follow up with Kelly Meres, MD. On 01/16/2012. (11:45a  (Gate Code 0800))    Contact information:   [address] [place] [address] [phone]           The results of significant diagnostics from this hospitalization (including imaging, microbiology, ancillary and laboratory) are listed below for reference.    Significant Diagnostic Studies: Dg Chest 2 View  01/07/2012  *RADIOLOGY REPORT*  Clinical Data: 78 year old female shortness of breath.  CHEST - 2 VIEW  Comparison: 03/10/2011 and earlier.  Findings: Stable cardiomegaly and mediastinal contours.  Stable right PICC line.  Chronic coarse and widespread pulmonary markings have not significantly changed.  No pneumothorax or pulmonary edema.  No pleural effusion or new pulmonary opacity.  Calcified atherosclerosis projecting over the lung apices.  Osteopenia. Stable visualized osseous structures.  Right upper quadrant surgical clips.  IMPRESSION: Stable cardiomegaly and chronic lung disease.   Original Report Authenticated By: Kelly Hart, [name]      Microbiology: No results found for this or any previous visit (from the past 240 hour(s)).   Labs: Basic Metabolic Panel:  Lab 01/10/12 9147 01/09/12 0500 01/07/12 2240  NA 139 142 138  K 3.9  3.9 3.6  CL 105 108 103  CO2 23 23 22   GLUCOSE 143* 97 136*  BUN 17 14 14   CREATININE 1.42* 1.40* 1.30*  CALCIUM 8.6 8.9 9.7  MG -- -- --  PHOS -- -- --   Liver Function Tests:  Lab 01/07/12 2240  AST 28  ALT 23  ALKPHOS 112  BILITOT 0.6  PROT 7.8  ALBUMIN 3.9   No results found for this basename: LIPASE:5,AMYLASE:5 in the last 168 hours No results found for this basename: AMMONIA:5 in the last 168 hours CBC:  Lab 01/07/12 2240  WBC 7.8  NEUTROABS 5.5  HGB 12.3  HCT 36.5  MCV 87.3  PLT 187   Cardiac Enzymes:  Lab 01/08/12 1530 01/08/12 0915 01/08/12 0320  CKTOTAL -- -- --  CKMB -- -- --  CKMBINDEX -- -- --  TROPONINI <0.30 <0.30 <0.30   BNP: BNP (last 3 results)  Basename 01/07/12 2229 02/06/11 0500 02/03/11 0433  PROBNP 1049.0* 18370.0* 19995.0*   CBG: No results found for this basename: GLUCAP:5 in the last 168 hours  Time coordinating discharge: 40 minutes  Signed:  Bethanie Bloxom  [facility] 01/10/2012, 1:58 PM

## 2012-01-16 ENCOUNTER — Ambulatory Visit (HOSPITAL_COMMUNITY)
Admit: 2012-01-16 | Discharge: 2012-01-16 | Disposition: A | Discharge status: Home / Self Care | Payer: Medicare Other | Attending: Internal Medicine | Admitting: Internal Medicine

## 2012-01-16 VITALS — BP 128/68 | HR 100 | Wt 101.8 lb

## 2012-01-16 DIAGNOSIS — I272 Pulmonary hypertension, unspecified: Secondary | ICD-10-CM

## 2012-01-16 DIAGNOSIS — I2789 Other specified pulmonary heart diseases: Secondary | ICD-10-CM | POA: Insufficient documentation

## 2012-01-16 DIAGNOSIS — R0902 Hypoxemia: Secondary | ICD-10-CM | POA: Insufficient documentation

## 2012-01-16 DIAGNOSIS — I509 Heart failure, unspecified: Secondary | ICD-10-CM | POA: Insufficient documentation

## 2012-01-16 DIAGNOSIS — I50810 Right heart failure, unspecified: Secondary | ICD-10-CM

## 2012-01-16 NOTE — Progress Notes (Signed)
Patient ID: Kelly Hart, female   DOB: 01-18-1933, 78 y.o.   MRN: [id number] Dr. Robynn Pane - PCP   HPI:  Kelly Hart is a 78 year old AA female with of pulseless electrical activity arrest, right heart failure secondary to cor pulmonale,  pulmonary arterial hypertension, chronic respiratory failure, multifocal atrial tachycardia, peripheral arterial disease status post aortobifem bypass with acute occlusion of the right limb of the graft s/p thrombectomy by Dr. Arbie Cookey.  SBO admit in February.    02/08/2011 Cardiac catheterization showed minimal coronary artery disease with 30% in the right coronary artery and 40%in the distal PDA. However, right atrial pressure, we had a mean of 16, a PA pressure of  61/30 with an EDP of 40.  Pulmonary capillary wedge pressure in the right lower lobe pulmonary artery showed a wedge of 30.  LV pressure was 101/9 with an EDP of 13.  There was significant wedged LVEDP gradient concerning for mitral stenosis.  Her Fick cardiac output was 2.0 and cardiac index was 1.4, this was on low-dose dopamine.  Her PA  saturations at that time were 39%.   05/10/11: Echo LV 50-55%.  Grade 1 diastolic dysfunction. Ventricular septum: Septal motion showed "bounce", contour showed diastolic flattening and systolic flattening.  RV moderately dilated with mildly reduced.  RA mildly dilated.  Severe TR.  Pulmonary arteries with systolic pressure mod increased.   Admitted to [facility] 01/07/12  for increased dyspnea. D/C 01/10/12 discharge weight 99 pounds. Adcirca increased 20 mg daily and she was instructed to take lasix 20 mg twice a week.  RHC 9/25 on milrinone  RA = 4  RV = 56/1/5  PA = 66/24 (45)  LPCW = 13  RPCW = 11  Fick cardiac output/index = 5.6/3.8  PVR = 8.0 Woods  FA sat = 90%  PA sat = 69%, 70%  SVC sat = 69%   She returns for post hospital follow up. She remains of home Milrionone. On chronic 3 liters [place] continuously. SOB with exertion.  + Orthopnea. Denies PND. Occasional dizziness.  Essentially homebound. She has difficulty getting out of the house due to dyspnea. Weight at home 99-101. Desaturates with exertion down to 70s. Oxygen saturations 98% at rest. . Poor appetite.    ROS: All systems negative except as listed in HPI, PMH and Problem List.  Past Medical History  Diagnosis Date  . Hypertension   . Renal insufficiency     baseline around cr 1.3  . Atrial fibrillation   . COPD (chronic obstructive pulmonary disease)   . CHF (congestive heart failure)     diastolic  . PEA (Pulseless electrical activity) Oct 2012  . PAD (peripheral artery disease)   . Hyperlipidemia   . Hypothyroidism   . History of cervical cancer   . Chronic diarrhea     secondary to treatment of her cervical cancer  . Hydronephrosis, left   . Pulmonary hypertension   . GERD (gastroesophageal reflux disease)   . Myocardial infarction     Current Outpatient Prescriptions  Medication Sig Dispense Refill  . ALPRAZolam (XANAX) 0.25 MG tablet Take 0.25 mg by mouth at bedtime as needed. For sleep      . amiodarone (PACERONE) 200 MG tablet Take 200 mg by mouth daily.      Marland Kitchen amLODipine (NORVASC) 5 MG tablet Take 1 tablet (5 mg total) by mouth daily.  30 tablet  6  . aspirin EC 81 MG tablet Take 81 mg by mouth daily.        Marland Kitchen  atorvastatin (LIPITOR) 40 MG tablet Take 40 mg by mouth daily.      . digoxin (LANOXIN) 0.125 MG tablet Take 0.5 tablets (62.5 mcg total) by mouth daily.  15 tablet  6  . diphenhydrAMINE (BENADRYL) 25 mg capsule Take 25 mg by mouth at bedtime as needed. For sleep      . furosemide (LASIX) 20 MG tablet Take 1 tablet (20 mg total) by mouth daily.  30 tablet  0  . levothyroxine (SYNTHROID, LEVOTHROID) 150 MCG tablet Take 150 mcg by mouth daily.      . magnesium oxide (MAG-OX) 400 MG tablet Take 200 mg by mouth daily.       . Milrinone in Dextrose (MILRINONE-DEXTROSE IV) Inject 0.25 mcg/kg/min into the vein. Concentration: 1 mg/mL (140 mL bag)      . Multiple  Vitamins-Minerals (MULTIVITAMINS THER. W/MINERALS) TABS Take 1 tablet by mouth daily.        . pantoprazole (PROTONIX) 40 MG tablet Take 40 mg by mouth daily.      . Tadalafil, PAH, 20 MG TABS Take 1 tablet (20 mg total) by mouth daily.  30 tablet  3  . tiotropium (SPIRIVA) 18 MCG inhalation capsule Place 1 capsule (18 mcg total) into inhaler and inhale daily.  30 capsule  6  . DISCONTD: zolpidem (AMBIEN) 5 MG tablet Take 1 tablet (5 mg total) by mouth at bedtime as needed for sleep.  30 tablet  1     PHYSICAL EXAM: Filed Vitals:   01/16/12 1146  BP: 128/68  Pulse: 100  Weight: 101 lb 12 oz (46.153 kg)  SpO2: 90%  101 (99 pounds)  General: Thin appearing. No resp difficulty wearing O2. Sitting in W-C on 3 liters [place]. 2 daughters present HEENT: normal Neck: supple. JVP 5-6 Carotids 2+ bilaterally; no bruits. No lymphadenopathy or thryomegaly appreciated. Cor: PMI normal.Tachycardiac Regular rate & rhythm. 2/6 SEM at RSB. +prominent s2. +RV lift Lungs: clear with decreased BS throughout. No wheezes. 2 liters [place] Abdomen: soft, nontender, nondistended. No hepatosplenomegaly. No bruits or masses. Good bowel sounds. Extremities: no cyanosis, clubbing, rash, edema. RUE PICC Neuro: alert & orientedx3, cranial nerves grossly intact. Moves all 4 extremities w/o difficulty. Affect pleasant.    ASSESSMENT & PLAN:

## 2012-01-16 NOTE — Patient Instructions (Addendum)
Increase oxygen to 5 liters when walking and continue 2 liters at rest.   Call us next week and tell us  oxygen saturation numbers [phone]   Follow up in 3-4 weeks with Dr Gala Romney

## 2012-01-16 NOTE — Assessment & Plan Note (Addendum)
Volume status stable. Continue current diuretic regimen.   Patient seen and examined with [name], NP. We discussed all aspects of the encounter. I agree with the assessment and plan as stated above. She is improved after diuresis. Continues to do well on milrinone despite significant RV dysfunction.

## 2012-01-16 NOTE — Assessment & Plan Note (Addendum)
Continues to have exertional dyspnea. Continue adcirca 20 mg daily. Exertional oxygen saturations decreased to 75% on 3 liters .Increase oxygen at rest to 5 liters and continue 3 liters all other times. Follow up in 1 month.  Patient seen and examined with [name], NP. We discussed all aspects of the encounter. I agree with the assessment and plan as stated above. Recent RHC reviewed with her and her family. Volume status much improved. Lasix restarted. Reinforced need for daily weights and reviewed use of sliding scale diuretics. Adcirca increased to 20 daily.

## 2012-01-17 ENCOUNTER — Encounter: Payer: Self-pay | Admitting: Internal Medicine

## 2012-01-19 DIAGNOSIS — R0902 Hypoxemia: Secondary | ICD-10-CM | POA: Insufficient documentation

## 2012-01-19 NOTE — Assessment & Plan Note (Signed)
Suspect this is due to COPD and RHF. Stressed need to increase O2 to keep sats >= 90%. May need oximizer.

## 2012-01-23 ENCOUNTER — Telehealth (HOSPITAL_COMMUNITY): Payer: Self-pay | Admitting: *Deleted

## 2012-01-23 NOTE — Telephone Encounter (Signed)
Called Kelly Hart regarding pt's labs, K 3.3 per Ulyess Blossom, [place] give pt 40 meq KCL today, [name] aware and agreeable, she has weekly labs scheduled

## 2012-01-28 ENCOUNTER — Emergency Department (HOSPITAL_COMMUNITY)
Admission: EM | Admit: 2012-01-28 | Discharge: 2012-01-29 | Disposition: A | Discharge status: Home / Self Care | Payer: Medicare Other | Attending: Emergency Medicine | Admitting: Emergency Medicine

## 2012-01-28 DIAGNOSIS — Z87891 Personal history of nicotine dependence: Secondary | ICD-10-CM | POA: Insufficient documentation

## 2012-01-28 DIAGNOSIS — I252 Old myocardial infarction: Secondary | ICD-10-CM | POA: Insufficient documentation

## 2012-01-28 DIAGNOSIS — Z79899 Other long term (current) drug therapy: Secondary | ICD-10-CM | POA: Insufficient documentation

## 2012-01-28 DIAGNOSIS — J449 Chronic obstructive pulmonary disease, unspecified: Secondary | ICD-10-CM | POA: Insufficient documentation

## 2012-01-28 DIAGNOSIS — N289 Disorder of kidney and ureter, unspecified: Secondary | ICD-10-CM | POA: Insufficient documentation

## 2012-01-28 DIAGNOSIS — I4891 Unspecified atrial fibrillation: Secondary | ICD-10-CM | POA: Insufficient documentation

## 2012-01-28 DIAGNOSIS — Z888 Allergy status to other drugs, medicaments and biological substances status: Secondary | ICD-10-CM | POA: Insufficient documentation

## 2012-01-28 DIAGNOSIS — I509 Heart failure, unspecified: Secondary | ICD-10-CM | POA: Insufficient documentation

## 2012-01-28 DIAGNOSIS — E785 Hyperlipidemia, unspecified: Secondary | ICD-10-CM | POA: Insufficient documentation

## 2012-01-28 DIAGNOSIS — J4489 Other specified chronic obstructive pulmonary disease: Secondary | ICD-10-CM | POA: Insufficient documentation

## 2012-01-28 DIAGNOSIS — I2789 Other specified pulmonary heart diseases: Secondary | ICD-10-CM | POA: Insufficient documentation

## 2012-01-28 DIAGNOSIS — K219 Gastro-esophageal reflux disease without esophagitis: Secondary | ICD-10-CM | POA: Insufficient documentation

## 2012-01-28 DIAGNOSIS — I10 Essential (primary) hypertension: Secondary | ICD-10-CM | POA: Insufficient documentation

## 2012-01-28 DIAGNOSIS — E039 Hypothyroidism, unspecified: Secondary | ICD-10-CM | POA: Insufficient documentation

## 2012-01-28 NOTE — ED Provider Notes (Signed)
History  This chart was scribed for Shelda Jakes, MD by Shari Heritage. The patient was seen in room TR08C/TR08C. Patient's care was started at 2323.     CSN: [id number]  Arrival date & time 01/28/12  2300   First MD Initiated Contact with Patient 01/28/12 2323      Chief Complaint  Patient presents with  . IV Medication    The history is provided by the patient. No language interpreter was used.    Kelly Hart is a 78 y.o. female who presents to the Emergency Department complaining of leaking in her PICC line tubing after it was accidentally cut approximately 3-4 hours ago. Patient uses 1 bag of Milrinone per week and started her current bag on Thursday. Patient denies SOB, chest pain, HA, abdominal pain, nausea, vomiting or fever. Patient has a history of congestive heart failure, MI, peripheral artery disease, and pulmonary hypertension.  PCP - Curt Bears, [place]) Cardiologist - Augustina Mood   Past Medical History  Diagnosis Date  . Hypertension   . Renal insufficiency     baseline around cr 1.3  . Atrial fibrillation   . COPD (chronic obstructive pulmonary disease)   . CHF (congestive heart failure)     diastolic  . PEA (Pulseless electrical activity) Oct 2012  . PAD (peripheral artery disease)   . Hyperlipidemia   . Hypothyroidism   . History of cervical cancer   . Chronic diarrhea     secondary to treatment of her cervical cancer  . Hydronephrosis, left   . Pulmonary hypertension   . GERD (gastroesophageal reflux disease)   . Myocardial infarction     Past Surgical History  Procedure Date  . Aortobifem bypass grafting   . Open thrombectomy Oct 2012  . Pr vein bypass graft,aorto-fem-pop 02/04/2009    Family History  Problem Relation Age of Onset  . Heart failure Brother   . Heart disease Father   . Throat cancer Sister   . Emphysema Father     History  Substance Use Topics  . Smoking status: Former Smoker -- 0.5 packs/day for 50 years   Types: Cigarettes    Quit date: 01/15/2011  . Smokeless tobacco: Never Used  . Alcohol Use: No    OB History    Grav Para Term Preterm Abortions TAB SAB Ect Mult Living                  Review of Systems  Constitutional: Negative for fever.  Respiratory: Negative for shortness of breath.   Cardiovascular: Negative for chest pain.  Gastrointestinal: Negative for nausea, vomiting and abdominal pain.  Neurological: Negative for headaches.    Allergies  Iodine; Lisinopril; Omnipaque; and Penicillins  Home Medications   Current Outpatient Rx  Name Route Sig Dispense Refill  . ALPRAZOLAM 0.25 MG PO TABS Oral Take 0.25 mg by mouth at bedtime as needed. For sleep    . AMIODARONE HCL 200 MG PO TABS Oral Take 200 mg by mouth daily.    Marland Kitchen AMLODIPINE BESYLATE 5 MG PO TABS Oral Take 1 tablet (5 mg total) by mouth daily. 30 tablet 6  . ASPIRIN EC 81 MG PO TBEC Oral Take 81 mg by mouth daily.      . ATORVASTATIN CALCIUM 40 MG PO TABS Oral Take 40 mg by mouth daily.    Marland Kitchen DIGOXIN 0.125 MG PO TABS Oral Take 0.5 tablets (62.5 mcg total) by mouth daily. 15 tablet 6  . DIPHENHYDRAMINE HCL 25  MG PO CAPS Oral Take 25 mg by mouth at bedtime as needed. For sleep    . FUROSEMIDE 20 MG PO TABS Oral Take 20 mg by mouth 2 (two) times a week. Tues and Fri    . LEVOTHYROXINE SODIUM 150 MCG PO TABS Oral Take 150 mcg by mouth daily.    Marland Kitchen MAGNESIUM OXIDE 400 MG PO TABS Oral Take 200 mg by mouth daily.     Marland Kitchen MILRINONE-DEXTROSE IV Intravenous Inject 0.25 mcg/kg/min into the vein. Concentration: 1 mg/mL (140 mL bag)    . THERA M PLUS PO TABS Oral Take 1 tablet by mouth daily.      Marland Kitchen PANTOPRAZOLE SODIUM 40 MG PO TBEC Oral Take 40 mg by mouth daily.    Marland Kitchen TADALAFIL (PAH) 20 MG PO TABS Oral Take 1 tablet (20 mg total) by mouth daily. 30 tablet 3  . TIOTROPIUM BROMIDE MONOHYDRATE 18 MCG IN CAPS Inhalation Place 1 capsule (18 mcg total) into inhaler and inhale daily. 30 capsule 6    BP 161/88  Pulse 117  Temp 98.1  F (36.7 C) (Oral)  Resp 18  SpO2 95%  Physical Exam  Constitutional: She is oriented to person, place, and time. She appears well-developed and well-nourished.  HENT:  Head: Normocephalic and atraumatic.  Cardiovascular: Normal rate and regular rhythm.   No murmur heard. Pulmonary/Chest: Effort normal and breath sounds normal. No respiratory distress.  Abdominal: Soft. Bowel sounds are normal. There is no tenderness.  Neurological: She is alert and oriented to person, place, and time.    ED Course  Procedures (including critical care time) DIAGNOSTIC STUDIES: Oxygen Saturation is 95% on room air, adequate by my interpretation.    COORDINATION OF CARE: 11:42pm- Patient informed of current plan for treatment and evaluation and agrees with plan at this time.      Labs Reviewed - No data to display No results found.   1. CHF (congestive heart failure)       MDM   Patient with history of severe CHF. Patient is followed by [facility] cardiology patient is followed by [facility] cardiology. Patient is on a specialized pressor with continuous infusion with small tubing a specialized pump external for the past year in the medication that she is on is MILRInone. Contacted cardiology they're not aware were we can place his tubing so plan will be to have pharmacy make up another bag his current medication has been contaminated by the breech in the tubing and we will put it on an IV pump. CDU overnight home health nurses she can have new tubing a new medication here sometime in the morning. Patient is in no distress no shortness of breath no chest pain.     I personally performed the services described in this documentation, which was scribed in my presence. The recorded information has been reviewed and considered.     Shelda Jakes, MD 01/29/12 [phone]

## 2012-01-28 NOTE — ED Notes (Signed)
Pt presented to ED with a leaking tubing of milrinone  Infusion.Tubing assessed.Jeanene Erb the home health nurse and as per her the tubing would take 2 to 3 hrs to be delivered to her but the bag can be delivered only in the morning.

## 2012-01-28 NOTE — ED Notes (Signed)
Pt daughter was cutting dressing off of pt arm and accidentally cut tubing that is infusing cardiac meds (Milrinone) to PICC line.  Pt re-taped line and pump is running.  Pt called home health and they stated that the RN was in [place] and wouldn't be here for 3 hours.

## 2012-01-29 ENCOUNTER — Encounter (HOSPITAL_COMMUNITY): Payer: Self-pay | Admitting: *Deleted

## 2012-01-29 MED ORDER — MILRINONE IN DEXTROSE 200-5 MCG/ML-% IV SOLN
0.2500 ug/kg/min | INTRAVENOUS | Status: DC
  Administered 2012-01-29: 0.25 ug/kg/min via INTRAVENOUS
  Filled 2012-01-29: qty 100

## 2012-01-29 NOTE — ED Provider Notes (Signed)
Kelly Hart is a 78 y.o. female in CDU from pod A. signout from Dr. Hyacinth Meeker as follows: Patient with PICC line receiving milrinone had issues with the tubing to the PICC line yesterday patient presented to ED for administration she is pending replacement a PIC line tubing which will be brought to the hospital by home health aides this morning.   9 AM: Patient resting comfortably in room. No complaints at this time. Lung sounds are clear to auscultation bilaterally heart rate on monitor is normal. There is no tenderness to palpation of the abdomen. We expect the tubing to arrive at approximately 9:30 AM.  Going to think change successfully. Patient is stable for discharge and will followup with her cardiologist Dr. Rocky Crafts Kelly Mapel, PA-C 01/29/12 [phone]

## 2012-01-29 NOTE — ED Notes (Signed)
Family at bedside. 

## 2012-01-30 ENCOUNTER — Encounter: Payer: Self-pay | Admitting: Internal Medicine

## 2012-01-31 ENCOUNTER — Telehealth (HOSPITAL_COMMUNITY): Payer: Self-pay | Admitting: *Deleted

## 2012-01-31 NOTE — Telephone Encounter (Signed)
[name] called and is concerned about pat, she has been complaining of feeling weak, shakey and nauseated, this has been going on for about a week and has gotten worse over past 2 days, wt is stable at 101 and BP has been good, was 110/65 yesterday, no edema or SOB, discussed w/Nicki Elige Radon, [place] doesn't feel it's r/t meds or heart failure, pt has history of GI blockage advised pt to see PCP, [name] is agreeable

## 2012-02-04 ENCOUNTER — Encounter: Payer: Self-pay | Admitting: Internal Medicine

## 2012-02-04 NOTE — ED Provider Notes (Signed)
Medical screening examination/treatment/procedure(s) were performed by non-physician practitioner and as supervising physician I was immediately available for consultation/collaboration.   Suzi Roots, MD 02/04/12 [phone]

## 2012-02-05 ENCOUNTER — Encounter: Payer: Self-pay | Admitting: Internal Medicine

## 2012-02-11 ENCOUNTER — Ambulatory Visit (HOSPITAL_COMMUNITY)
Admission: RE | Admit: 2012-02-11 | Discharge: 2012-02-11 | Disposition: A | Discharge status: Home / Self Care | Payer: Medicare Other | Source: Ambulatory Visit | Attending: Internal Medicine | Admitting: Internal Medicine

## 2012-02-11 VITALS — BP 142/80 | HR 105 | Wt 103.5 lb

## 2012-02-11 DIAGNOSIS — I509 Heart failure, unspecified: Secondary | ICD-10-CM

## 2012-02-11 DIAGNOSIS — I50810 Right heart failure, unspecified: Secondary | ICD-10-CM

## 2012-02-11 DIAGNOSIS — I272 Pulmonary hypertension, unspecified: Secondary | ICD-10-CM

## 2012-02-11 DIAGNOSIS — I2789 Other specified pulmonary heart diseases: Secondary | ICD-10-CM | POA: Insufficient documentation

## 2012-02-11 MED ORDER — FUROSEMIDE 20 MG PO TABS: ORAL_TABLET | ORAL | Status: DC

## 2012-02-11 NOTE — Assessment & Plan Note (Signed)
Continue Adcirca for PAH.

## 2012-02-11 NOTE — Assessment & Plan Note (Signed)
Volume status mildly elevated. Will increase lasix to 3x/week. Continue to watch renal function. Can cut back if Cr climbing or she gets dizzy. Continue Adcirca.

## 2012-02-11 NOTE — Patient Instructions (Addendum)
Take Lasix Mon-Wed-Fri  Home Health check BMET every other week   Follow up in 2 months

## 2012-02-11 NOTE — Progress Notes (Signed)
Patient ID: Kelly Hart, female   DOB: [birth year], 78 y.o.   MRN: [id number]  Dr. Robynn Pane - PCP   HPI:  Kelly Hart is a 78 year old AA female with of pulseless electrical activity arrest, right heart failure secondary to cor pulmonale,  pulmonary arterial hypertension, chronic respiratory failure, multifocal atrial tachycardia, peripheral arterial disease status post aortobifem bypass with acute occlusion of the right limb of the graft s/p thrombectomy by Dr. Arbie Cookey.  SBO admit in February.    02/08/2011 Cardiac catheterization showed minimal coronary artery disease with 30% in the right coronary artery and 40%in the distal PDA. However, right atrial pressure, we had a mean of 16, a PA pressure of  61/30 with an EDP of 40.  Pulmonary capillary wedge pressure in the right lower lobe pulmonary artery showed a wedge of 30.  LV pressure was 101/9 with an EDP of 13.  There was significant wedged LVEDP gradient concerning for mitral stenosis.  Her Fick cardiac output was 2.0 and cardiac index was 1.4, this was on low-dose dopamine.  Her PA  saturations at that time were 39%.   05/10/11: Echo LV 50-55%.  Grade 1 diastolic dysfunction. Ventricular septum: Septal motion showed "bounce", contour showed diastolic flattening and systolic flattening.  RV moderately dilated with mildly reduced.  RA mildly dilated.  Severe TR.  Pulmonary arteries with systolic pressure mod increased.   Admitted to [facility] 01/07/12  for increased dyspnea. D/C 01/10/12 discharge weight 99 pounds. Adcirca increased 20 mg daily and she was instructed to take lasix 20 mg twice a week.  RHC 01/09/12 on milrinone  RA = 4  RV = 56/1/5  PA = 66/24 (45)  LPCW = 13  RPCW = 11  Fick cardiac output/index = 5.6/3.8  PVR = 8.0 Woods  FA sat = 90%  PA sat = 69%, 70%  SVC sat = 69%   01/30/12  Creatinine 1.41 Potassium 3.7  She returns for follow up. Remains on Milrionone. Last visit oxygen increased to 5 liters with exertion and continue 3 liters all  other times. Feels good. No lower extremity edema. Taking lasix 2x/week. Weight at home 99-101 pound. Continues to live at [facility] in [place]. Followed by [facility] for PICC and Milrionone. No syncope or presyncope. Mild exertional dyspnea.    ROS: All systems negative except as listed in HPI, PMH and Problem List.  Past Medical History  Diagnosis Date  . Hypertension   . Renal insufficiency     baseline around cr 1.3  . Atrial fibrillation   . COPD (chronic obstructive pulmonary disease)   . CHF (congestive heart failure)     diastolic  . PEA (Pulseless electrical activity) Oct 2012  . PAD (peripheral artery disease)   . Hyperlipidemia   . Hypothyroidism   . History of cervical cancer   . Chronic diarrhea     secondary to treatment of her cervical cancer  . Hydronephrosis, left   . Pulmonary hypertension   . GERD (gastroesophageal reflux disease)   . Myocardial infarction     Current Outpatient Prescriptions  Medication Sig Dispense Refill  . ALPRAZolam (XANAX) 0.25 MG tablet Take 0.25 mg by mouth at bedtime as needed. For sleep      . amiodarone (PACERONE) 200 MG tablet Take 200 mg by mouth daily.      Marland Kitchen amLODipine (NORVASC) 5 MG tablet Take 1 tablet (5 mg total) by mouth daily.  30 tablet  6  .  aspirin EC 81 MG tablet Take 81 mg by mouth daily.        Marland Kitchen atorvastatin (LIPITOR) 40 MG tablet Take 40 mg by mouth daily.      . digoxin (LANOXIN) 0.125 MG tablet Take 0.5 tablets (62.5 mcg total) by mouth daily.  15 tablet  6  . diphenhydrAMINE (BENADRYL) 25 mg capsule Take 25 mg by mouth at bedtime as needed. For sleep      . furosemide (LASIX) 20 MG tablet Take 20 mg by mouth 2 (two) times a week. Tues and Fri      . levothyroxine (SYNTHROID, LEVOTHROID) 150 MCG tablet Take 150 mcg by mouth daily.      . magnesium oxide (MAG-OX) 400 MG tablet Take 200 mg by mouth daily.       . Milrinone in Dextrose (MILRINONE-DEXTROSE IV) Inject 0.25 mcg/kg/min  into the vein. Concentration: 1 mg/mL (140 mL bag)      . Multiple Vitamins-Minerals (MULTIVITAMINS THER. W/MINERALS) TABS Take 1 tablet by mouth daily.        . pantoprazole (PROTONIX) 40 MG tablet Take 40 mg by mouth daily.      . Tadalafil, PAH, 20 MG TABS Take 1 tablet (20 mg total) by mouth daily.  30 tablet  3  . tiotropium (SPIRIVA) 18 MCG inhalation capsule Place 1 capsule (18 mcg total) into inhaler and inhale daily.  30 capsule  6  . DISCONTD: zolpidem (AMBIEN) 5 MG tablet Take 1 tablet (5 mg total) by mouth at bedtime as needed for sleep.  30 tablet  1     PHYSICAL EXAM: Filed Vitals:   02/11/12 1323  BP: 142/80  Pulse: 105  Weight: 103 lb 8 oz (46.947 kg)  SpO2: 91%  103 (101)  General: Thin appearing. No resp difficulty wearing O2. Sitting in W-C on 5 liters [place]. 2 daughters present HEENT: normal Neck: supple. JVP 8-9 Carotids 2+ bilaterally; no bruits. No lymphadenopathy or thryomegaly appreciated. Cor: PMI normal.Tachycardiac Regular rate & rhythm. 2/6 SEM at RSB. +prominent s2. +RV lift Lungs: clear with decreased BS throughout. No wheezes. 2 liters [place] Abdomen: soft, nontender, nondistended. No hepatosplenomegaly. No bruits or masses. Good bowel sounds. Extremities: no cyanosis, clubbing, rash, edema. RUE PICC Neuro: alert & orientedx3, cranial nerves grossly intact. Moves all 4 extremities w/o difficulty. Affect pleasant.    ASSESSMENT & PLAN:

## 2012-02-20 ENCOUNTER — Encounter: Payer: Self-pay | Admitting: Internal Medicine

## 2012-02-20 ENCOUNTER — Telehealth: Payer: Self-pay | Admitting: Emergency Medicine

## 2012-02-20 NOTE — Telephone Encounter (Signed)
Daughter returned call from triage. Hazel Sams

## 2012-02-20 NOTE — Telephone Encounter (Signed)
Spoke with Kelly Hart-states that the patient had spoken with RB about getting letter mailed to her home address about the amount of money pt has paid for seeing RB since the start of the year. States RB told her it would take a couple of days and would be mailed out. RB are you aware of any thing about this? We can have Kelly Hart assist [place] with this if needed. Thanks.

## 2012-02-20 NOTE — Telephone Encounter (Signed)
Called [name] back at number provided, no answer.  [id number]

## 2012-02-21 ENCOUNTER — Encounter: Payer: Self-pay | Admitting: Internal Medicine

## 2012-02-21 NOTE — Telephone Encounter (Signed)
Called and spoke with patients daughter. Per Sharone--patient would need to contact billing office at [phone] for a summary of her billing to give to her landlord.  Junious Dresser verbalized understanding of this and nothing further needed at this time.

## 2012-02-27 ENCOUNTER — Encounter: Payer: Self-pay | Admitting: Emergency Medicine

## 2012-02-27 ENCOUNTER — Ambulatory Visit (INDEPENDENT_AMBULATORY_CARE_PROVIDER_SITE_OTHER): Payer: Medicare Other | Admitting: Emergency Medicine

## 2012-02-27 VITALS — BP 114/72 | HR 114 | Temp 97.9°F | Ht 64.0 in | Wt 105.6 lb

## 2012-02-27 DIAGNOSIS — I272 Pulmonary hypertension, unspecified: Secondary | ICD-10-CM

## 2012-02-27 DIAGNOSIS — J449 Chronic obstructive pulmonary disease, unspecified: Secondary | ICD-10-CM

## 2012-02-27 DIAGNOSIS — I2789 Other specified pulmonary heart diseases: Secondary | ICD-10-CM

## 2012-02-27 MED ORDER — BUDESONIDE-FORMOTEROL FUMARATE 160-4.5 MCG/ACT IN AERO: 2.0000 | INHALATION_SPRAY | Freq: Two times a day (BID) | RESPIRATORY_TRACT | Status: AC

## 2012-02-27 NOTE — Addendum Note (Signed)
Addended by: Orma Flaming D on: 02/27/2012 02:36 PM   Modules accepted: Orders

## 2012-02-27 NOTE — Assessment & Plan Note (Signed)
We will try restarting Symbicort 2 puffs twice a day until next visit Rinse your mouth out and spit after using this inhaler.

## 2012-02-27 NOTE — Addendum Note (Signed)
Addended by: Orma Flaming D on: 02/27/2012 02:07 PM   Modules accepted: Orders

## 2012-02-27 NOTE — Patient Instructions (Addendum)
Please increase your oxygen at rest to 4L/min. Contineu to increase to 5L/min with exertion.  We will try restarting Symbicort 2 puffs twice a day until next visit Rinse your mouth out and spit after using this inhaler.  Continue your tadalafil at 20mg  daily.  Use your lasix as directed.  Echocardiogram with Dr Gala Romney as planned Follow with Dr Delton Coombes in 2 months

## 2012-02-27 NOTE — Progress Notes (Signed)
Subjective:     Patient ID: Kelly Hart, female   DOB: 26-Apr-1932, 78 y.o.   MRN: [id number] HPI 78 yo woman, hx HTN and diastolic CHF, CRI, A Fib/MAT, hypothyroidism. Also hx tobacco use (30 pk-yrs) and possible COPD. Secondary PAH with cor pulmonale on milrinone infusion and started on tadalifil in 04/2011 at hospitalization. She is on chronic O2 at 2L/min started in 11/12. She is experiencing exertional SOB, slowly progressive.   ROV 09/28/11 -- hx HTN and diastolic CHF, CRI, A Fib/MAT, hypothyroidism. Also hx tobacco use (30 pk-yrs) and possible COPD. On tadalifil 10mg  for secondary PAH. Returns for PFT today - mixed obstruction and restriction, severe diffusion defect, no BD response.  Wearing O2 at 2L/min, forgets to increase to 3L/min with exertion.   ROV 11/30/11 -- hx HTN and diastolic CHF, CRI, A Fib/MAT, hypothyroidism. Also hx tobacco use (30 pk-yrs) and possible COPD. On tadalifil 10mg  for secondary PAH. We started spiriva in June for mixed disease. She felt that she was more SOB on the Spiriva, difficulty walking thru the house. She stopped the spiriva 2 weeks ago and feels that she is almost back to baseline. Her O2 compliance is questionable.    ROV 01/04/12 -- hx HTN and diastolic CHF, CRI, A Fib/MAT followed by Dr Gala Romney, hypothyroidism. Also hx tobacco use (30 pk-yrs) and possible COPD (mixed disease with DLCO defect). Remains on tadalifil 10mg   for her secondary PAH. Did not benefit from Spiriva, last time we did trial Symbicort to see if she would benefit. She isn't sure whether it helped her. She is severely dyspneic with exertion. Hypoxic today with walking, reports hypoxemia at rest at home on the RA.    ROV 02/27/12 -- hx HTN and diastolic CHF, CRI, A Fib/MAT followed by Dr Gala Romney, hypothyroidism. Also hx tobacco use (30 pk-yrs) and possible COPD (mixed disease with DLCO defect). We have tried spiriva and symbicort >> she was never sure that she benefited. Continues to desat  with exertion. She doesn';t always increase O2 with walking when at home. Since last time Dr Gala Romney increased tadalafil to 20mg  qd >> no change       Objective:   Physical Exam Filed Vitals:   02/27/12 1336  BP: 114/72  Pulse: 114  Temp: 97.9 F (36.6 C)   Gen: Pleasant, thin, in no distress,  normal affect on O2  ENT: No lesions,  mouth clear,  oropharynx clear, no postnasal drip  Neck: No JVD, no TMG, no carotid bruits  Lungs: No use of accessory muscles, no dullness to percussion, clear without rales or rhonchi  Cardiovascular: irregular, heart sounds normal, no murmur or gallops, no peripheral edema, milrinone infusion in place  Musculoskeletal: No deformities, no cyanosis or clubbing  Neuro: alert, non focal  Skin: Warm, no lesions or rashes   05/10/11 --  Study Conclusions  - Left ventricle: The cavity size was normal. Wall thickness was increased in a pattern of mild LVH. Systolic function was normal. The estimated ejection fraction was in the range of 50% to 55%. Doppler parameters are consistent with abnormal left ventricular relaxation (grade 1 diastolic dysfunction). - Ventricular septum: Septal motion showed "bounce". The contour showed diastolic flattening and systolic flattening. - Right ventricle: The cavity size was moderately dilated. Systolic function was mildly reduced. - Right atrium: The atrium was mildly dilated. - Tricuspid valve: Severe regurgitation. - Pulmonary arteries: Systolic pressure was moderately increased.     Assessment:     Pulmonary hypertension Please  increase your oxygen at rest to 4L/min. Contineu to increase to 5L/min with exertion.   Continue your tadalafil at 20mg  daily.  Use your lasix as directed.  Echocardiogram with Dr Gala Romney as planned Follow with Dr Delton Coombes in 2 months   COPD (chronic obstructive pulmonary disease) We will try restarting Symbicort 2 puffs twice a day until next visit Rinse your mouth out  and spit after using this inhaler.

## 2012-02-27 NOTE — Assessment & Plan Note (Signed)
Please increase your oxygen at rest to 4L/min. Contineu to increase to 5L/min with exertion.   Continue your tadalafil at 20mg  daily.  Use your lasix as directed.  Echocardiogram with Dr Gala Romney as planned Follow with Dr Delton Coombes in 2 months

## 2012-03-05 ENCOUNTER — Encounter: Payer: Self-pay | Admitting: Internal Medicine

## 2012-03-10 ENCOUNTER — Other Ambulatory Visit (HOSPITAL_COMMUNITY): Payer: Self-pay | Admitting: *Deleted

## 2012-03-10 MED ORDER — TADALAFIL (PAH) 20 MG PO TABS: 20.0000 mg | ORAL_TABLET | Freq: Every day | ORAL | Status: DC

## 2012-03-20 ENCOUNTER — Encounter: Payer: Self-pay | Admitting: Internal Medicine

## 2012-03-24 ENCOUNTER — Other Ambulatory Visit (HOSPITAL_COMMUNITY): Payer: Self-pay | Admitting: *Deleted

## 2012-03-24 MED ORDER — AMIODARONE HCL 200 MG PO TABS: 200.0000 mg | ORAL_TABLET | Freq: Every day | ORAL | Status: DC

## 2012-04-03 ENCOUNTER — Encounter: Payer: Self-pay | Admitting: Internal Medicine

## 2012-04-07 ENCOUNTER — Encounter (HOSPITAL_COMMUNITY): Payer: Self-pay

## 2012-04-07 ENCOUNTER — Ambulatory Visit (HOSPITAL_COMMUNITY)
Admission: RE | Admit: 2012-04-07 | Discharge: 2012-04-07 | Disposition: A | Discharge status: Home / Self Care | Payer: Medicare Other | Source: Ambulatory Visit | Attending: Internal Medicine | Admitting: Internal Medicine

## 2012-04-07 VITALS — BP 120/76 | HR 114 | Wt 107.4 lb

## 2012-04-07 DIAGNOSIS — I252 Old myocardial infarction: Secondary | ICD-10-CM | POA: Insufficient documentation

## 2012-04-07 DIAGNOSIS — E039 Hypothyroidism, unspecified: Secondary | ICD-10-CM | POA: Insufficient documentation

## 2012-04-07 DIAGNOSIS — I70209 Unspecified atherosclerosis of native arteries of extremities, unspecified extremity: Secondary | ICD-10-CM | POA: Insufficient documentation

## 2012-04-07 DIAGNOSIS — J4489 Other specified chronic obstructive pulmonary disease: Secondary | ICD-10-CM | POA: Insufficient documentation

## 2012-04-07 DIAGNOSIS — I2789 Other specified pulmonary heart diseases: Secondary | ICD-10-CM | POA: Insufficient documentation

## 2012-04-07 DIAGNOSIS — J449 Chronic obstructive pulmonary disease, unspecified: Secondary | ICD-10-CM | POA: Insufficient documentation

## 2012-04-07 DIAGNOSIS — I50810 Right heart failure, unspecified: Secondary | ICD-10-CM

## 2012-04-07 DIAGNOSIS — Z8541 Personal history of malignant neoplasm of cervix uteri: Secondary | ICD-10-CM | POA: Insufficient documentation

## 2012-04-07 DIAGNOSIS — I4891 Unspecified atrial fibrillation: Secondary | ICD-10-CM | POA: Insufficient documentation

## 2012-04-07 DIAGNOSIS — Z79899 Other long term (current) drug therapy: Secondary | ICD-10-CM | POA: Insufficient documentation

## 2012-04-07 DIAGNOSIS — E785 Hyperlipidemia, unspecified: Secondary | ICD-10-CM | POA: Insufficient documentation

## 2012-04-07 DIAGNOSIS — I5030 Unspecified diastolic (congestive) heart failure: Secondary | ICD-10-CM | POA: Insufficient documentation

## 2012-04-07 DIAGNOSIS — I509 Heart failure, unspecified: Secondary | ICD-10-CM | POA: Insufficient documentation

## 2012-04-07 DIAGNOSIS — R0902 Hypoxemia: Secondary | ICD-10-CM

## 2012-04-07 DIAGNOSIS — I2510 Atherosclerotic heart disease of native coronary artery without angina pectoris: Secondary | ICD-10-CM | POA: Insufficient documentation

## 2012-04-07 MED ORDER — AMIODARONE HCL 200 MG PO TABS: 100.0000 mg | ORAL_TABLET | Freq: Every day | ORAL | Status: AC

## 2012-04-07 NOTE — Progress Notes (Signed)
Dr. Robynn Pane - PCP   HPI:  Kelly Hart is a 78 year old AA female with of pulseless electrical activity arrest, right heart failure secondary to cor pulmonale,  pulmonary arterial hypertension, chronic respiratory failure, multifocal atrial tachycardia, peripheral arterial disease status post aortobifem bypass with acute occlusion of the right limb of the graft s/p thrombectomy by Dr. Arbie Cookey.  SBO admit in February.    02/08/2011 Cardiac catheterization showed minimal coronary artery disease with 30% in the right coronary artery and 40%in the distal PDA. However, right atrial pressure, we had a mean of 16, a PA pressure of  61/30 with an EDP of 40.  Pulmonary capillary wedge pressure in the right lower lobe pulmonary artery showed a wedge of 30.  LV pressure was 101/9 with an EDP of 13.  There was significant wedged LVEDP gradient concerning for mitral stenosis.  Her Fick cardiac output was 2.0 and cardiac index was 1.4, this was on low-dose dopamine.  Her PA  saturations at that time were 39%.   05/10/11: Echo LV 50-55%.  Grade 1 diastolic dysfunction. Ventricular septum: Septal motion showed "bounce", contour showed diastolic flattening and systolic flattening.  RV moderately dilated with mildly reduced.  RA mildly dilated.  Severe TR.  Pulmonary arteries with systolic pressure mod increased.   Admitted to [facility] 01/07/12  for increased dyspnea. D/C 01/10/12 discharge weight 99 pounds. Adcirca increased 20 mg daily and she was instructed to take lasix 20 mg twice a week.  RHC 01/09/12 on milrinone  RA = 4  RV = 56/1/5  PA = 66/24 (45)  LPCW = 13  RPCW = 11  Fick cardiac output/index = 5.6/3.8  PVR = 8.0 Woods  FA sat = 90%  PA sat = 69%, 70%  SVC sat = 69%   Echo 01/08/12: LVEF 60-70%.  Ventricular septum showed septal bounce.  RV mod-severely dilated.  Atrial septum bowed right to left.  PAPP 82 mmHG.    She returns for follow up today.  She feels well.  Saw Dr. Delton Coombes on 11/13 and symbicort added and  increased O2 with activity to 5L, 4L at rest.  O2 sats dropping to 70s when she walks to the bathroom.  Weight 104 pounds.  Occ dizziness with standing.  No orthopnea/PND.   Hot flashes after increase in tildalafil.  No edema.    Continues to live at [facility] in [place]. Followed by [facility] for PICC and Milrionone.   ROS: All systems negative except as listed in HPI, PMH and Problem List.  Past Medical History  Diagnosis Date  . Hypertension   . Renal insufficiency     baseline around cr 1.3  . Atrial fibrillation   . COPD (chronic obstructive pulmonary disease)   . CHF (congestive heart failure)     diastolic  . PEA (Pulseless electrical activity) Oct 2012  . PAD (peripheral artery disease)   . Hyperlipidemia   . Hypothyroidism   . History of cervical cancer   . Chronic diarrhea     secondary to treatment of her cervical cancer  . Hydronephrosis, left   . Pulmonary hypertension   . GERD (gastroesophageal reflux disease)   . Myocardial infarction     Current Outpatient Prescriptions  Medication Sig Dispense Refill  . amiodarone (PACERONE) 200 MG tablet Take 1 tablet (200 mg total) by mouth daily.  30 tablet  6  . amLODipine (NORVASC) 5 MG tablet Take 1 tablet (5 mg total) by mouth daily.  30 tablet  6  . aspirin EC 81 MG tablet Take 81 mg by mouth daily.        Marland Kitchen atorvastatin (LIPITOR) 40 MG tablet Take 40 mg by mouth daily.      . budesonide-formoterol (SYMBICORT) 160-4.5 MCG/ACT inhaler Inhale 2 puffs into the lungs 2 (two) times daily.  1 Inhaler  6  . digoxin (LANOXIN) 0.125 MG tablet Take 0.5 tablets (62.5 mcg total) by mouth daily.  15 tablet  6  . furosemide (LASIX) 20 MG tablet Take Mon Wed and Fri. 3 times a week  30 tablet  4  . levothyroxine (SYNTHROID, LEVOTHROID) 150 MCG tablet Take 150 mcg by mouth daily.      . magnesium oxide (MAG-OX) 400 MG tablet Take 200 mg by mouth daily.      . Milrinone in Dextrose (MILRINONE-DEXTROSE  IV) Inject 0.25 mcg/kg/min into the vein. Concentration: 1 mg/mL (140 mL bag)      . Multiple Vitamins-Minerals (MULTIVITAMINS THER. W/MINERALS) TABS Take 1 tablet by mouth daily.        . pantoprazole (PROTONIX) 40 MG tablet Take 40 mg by mouth daily.      . Tadalafil, PAH, 20 MG TABS Take 1 tablet (20 mg total) by mouth daily.  30 tablet  3  . [DISCONTINUED] zolpidem (AMBIEN) 5 MG tablet Take 1 tablet (5 mg total) by mouth at bedtime as needed for sleep.  30 tablet  1     PHYSICAL EXAM: Filed Vitals:   04/07/12 1115  BP: 120/76  Pulse: 114  Weight: 107 lb 6.4 oz (48.716 kg)  SpO2: 75%  Repeat O2 100%  General: Thin appearing. No resp difficulty wearing O2. Sitting in W-C on 5 liters [place]. HEENT: normal Neck: supple. JVP 5-6. Carotids 2+ bilaterally; no bruits. No lymphadenopathy or thryomegaly appreciated. Cor: PMI normal.Tachycardiac Regular rate & rhythm. 2/6 SEM at RSB. +prominent s2. +RV lift Lungs: clear with decreased BS throughout. No wheezes.  Abdomen: soft, nontender, nondistended. No hepatosplenomegaly. No bruits or masses. Good bowel sounds. Extremities: no cyanosis, clubbing, rash, edema. RUE PICC Neuro: alert & orientedx3, cranial nerves grossly intact. Moves all 4 extremities w/o difficulty. Affect pleasant.    ASSESSMENT & PLAN:

## 2012-04-07 NOTE — Assessment & Plan Note (Addendum)
O2 sats dropping into 70s with ambulation/activity.  Will increase to 6L per n/c with activity, if sats remain low she may benefit from oximizer.  Will discuss with Dr. Delton Coombes.  Refer to pulm rehab  Attending: Agreed. Discussed oximizer with Dr. Delton Coombes who will help arrange. Need to keep sats >= 90% at all times if possible. Refer to pulm rehab.

## 2012-04-07 NOTE — Patient Instructions (Addendum)
Will refer to pulmonary rehab.  Increase O2 to 6L with activity.  Decrease amiodarone 100 mg daily.

## 2012-04-07 NOTE — Assessment & Plan Note (Addendum)
Volume status well controlled on home inotropes.  Will continue current regimen of milrinone and Adcirca.  Discussed daily weights.    Patient seen and examined with Ulyess Blossom, PA-C. We discussed all aspects of the encounter. I agree with the assessment and plan as stated above.  She is improved from a HF perspective. Continue milrinone and Adcirca. Volume status is fine.

## 2012-04-07 NOTE — Assessment & Plan Note (Addendum)
Cut back amiodarone 100 mg daily  Attending: Agreed. She is maintaining SR. Continue ASA. Not coumadin candidate.

## 2012-04-11 ENCOUNTER — Other Ambulatory Visit: Payer: Self-pay | Admitting: Emergency Medicine

## 2012-04-11 DIAGNOSIS — R0902 Hypoxemia: Secondary | ICD-10-CM

## 2012-04-15 ENCOUNTER — Encounter: Payer: Self-pay | Admitting: Internal Medicine

## 2012-04-18 ENCOUNTER — Telehealth (HOSPITAL_COMMUNITY): Payer: Self-pay | Admitting: Cardiology

## 2012-04-18 NOTE — Telephone Encounter (Signed)
Medication was decreased to 100 mg, however this meds was already at 100 mg. (amirodarone) Would provider like to keep this at 100 mg or decrease to 50 mg?   Also, has questions about dental work. Would like to know if she is safe to have routine dental including fillings and having partial redone? Aware she has cardiac problems and takes a lot of meds.

## 2012-04-18 NOTE — Telephone Encounter (Signed)
Also  [facility]  David,RN called to let us know pt has had a 5-6lb weight gain over the past week. This weight increase was seen at pts most recent OV with HF team, [name] just wanted to make sure there are no changes in orders please call with any changes [phone]

## 2012-04-18 NOTE — Telephone Encounter (Signed)
If weight is the same as at office visit then ok because Dr. B felt volume status fine.  Continue current regimen.  If weight continues to climb please call back.  Continue amio at 100 mg daily.  Will need to discuss with Dr. Gala Romney dental work.    Thanks.

## 2012-04-19 ENCOUNTER — Telehealth (HOSPITAL_COMMUNITY): Payer: Self-pay | Admitting: Physician Assistant

## 2012-04-19 NOTE — Telephone Encounter (Signed)
Discussed dental work with Dr. Gala Romney and he states she should be fine to undergo dental work.

## 2012-04-29 NOTE — Telephone Encounter (Signed)
Done

## 2012-04-29 NOTE — Telephone Encounter (Signed)
Pt aware.

## 2012-04-30 ENCOUNTER — Encounter: Payer: Self-pay | Admitting: Emergency Medicine

## 2012-04-30 ENCOUNTER — Ambulatory Visit (INDEPENDENT_AMBULATORY_CARE_PROVIDER_SITE_OTHER): Payer: Medicare Other | Admitting: Emergency Medicine

## 2012-04-30 VITALS — BP 120/70 | HR 112 | Temp 98.3°F | Ht 64.0 in | Wt 110.2 lb

## 2012-04-30 DIAGNOSIS — I2789 Other specified pulmonary heart diseases: Secondary | ICD-10-CM

## 2012-04-30 DIAGNOSIS — J449 Chronic obstructive pulmonary disease, unspecified: Secondary | ICD-10-CM

## 2012-04-30 DIAGNOSIS — I272 Pulmonary hypertension, unspecified: Secondary | ICD-10-CM

## 2012-04-30 NOTE — Progress Notes (Signed)
Subjective:     Patient ID: Kelly Hart, female   DOB: [birth year], 79 y.o.   MRN: [id number] HPI 79 yo woman, hx HTN and diastolic CHF, CRI, A Fib/MAT, hypothyroidism. Also hx tobacco use (30 pk-yrs) and possible COPD. Secondary PAH with cor pulmonale on milrinone infusion and started on tadalifil in 04/2011 at hospitalization. She is on chronic O2 at 2L/min started in 11/12. She is experiencing exertional SOB, slowly progressive.   ROV 09/28/11 -- hx HTN and diastolic CHF, CRI, A Fib/MAT, hypothyroidism. Also hx tobacco use (30 pk-yrs) and possible COPD. On tadalifil 10mg  for secondary PAH. Returns for PFT today - mixed obstruction and restriction, severe diffusion defect, no BD response.  Wearing O2 at 2L/min, forgets to increase to 3L/min with exertion.   ROV 11/30/11 -- hx HTN and diastolic CHF, CRI, A Fib/MAT, hypothyroidism. Also hx tobacco use (30 pk-yrs) and possible COPD. On tadalifil 10mg  for secondary PAH. We started spiriva in June for mixed disease. She felt that she was more SOB on the Spiriva, difficulty walking thru the house. She stopped the spiriva 2 weeks ago and feels that she is almost back to baseline. Her O2 compliance is questionable.    ROV 01/04/12 -- hx HTN and diastolic CHF, CRI, A Fib/MAT followed by Dr Gala Romney, hypothyroidism. Also hx tobacco use (30 pk-yrs) and possible COPD (mixed disease with DLCO defect). Remains on tadalifil 10mg   for her secondary PAH. Did not benefit from Spiriva, last time we did trial Symbicort to see if she would benefit. She isn't sure whether it helped her. She is severely dyspneic with exertion. Hypoxic today with walking, reports hypoxemia at rest at home on the RA.    ROV 02/27/12 -- hx HTN and diastolic CHF, CRI, A Fib/MAT followed by Dr Gala Romney, hypothyroidism. Also hx tobacco use (30 pk-yrs) and possible COPD (mixed disease with DLCO defect). We have tried spiriva and symbicort >> she was never sure that she benefited. Continues to desat  with exertion. She doesn';t always increase O2 with walking when at home. Since last time Dr Gala Romney increased tadalafil to 20mg  qd >> no change  ROV 04/30/12 -- multifactorial secondary PAH, probable COPD (not on BD's). Managed on tadalafil. Last time we decided to retry Symbicort to see if she would benefit - hasn't noticed any benefit. Also increased her O2 to 4L/min at rest, 5L/min exertion. She never got TTE wth Dr Gala Romney. She was prescribed an oximizer and increase to 6L/min w exertion but doesn't like to use it due to fit and appearance. She continues to desaturate with any activity.        Objective:   Physical Exam Filed Vitals:   04/30/12 1335  BP: 120/70  Pulse: 112  Temp: 98.3 F (36.8 C)   Gen: Pleasant, thin, in no distress,  normal affect on O2  ENT: No lesions,  mouth clear,  oropharynx clear, no postnasal drip  Neck: No JVD, no TMG, no carotid bruits  Lungs: No use of accessory muscles, no dullness to percussion, clear without rales or rhonchi  Cardiovascular: irregular, heart sounds normal, no murmur or gallops, no peripheral edema, milrinone infusion in place  Musculoskeletal: No deformities, no cyanosis or clubbing  Neuro: alert, non focal  Skin: Warm, no lesions or rashes   05/10/11 --  Study Conclusions  - Left ventricle: The cavity size was normal. Wall thickness was increased in a pattern of mild LVH. Systolic function was normal. The estimated ejection fraction was in the  range of 50% to 55%. Doppler parameters are consistent with abnormal left ventricular relaxation (grade 1 diastolic dysfunction). - Ventricular septum: Septal motion showed "bounce". The contour showed diastolic flattening and systolic flattening. - Right ventricle: The cavity size was moderately dilated. Systolic function was mildly reduced. - Right atrium: The atrium was mildly dilated. - Tricuspid valve: Severe regurgitation. - Pulmonary arteries: Systolic pressure was  moderately increased.     Assessment:     COPD (chronic obstructive pulmonary disease) She doesn't feel that Symbicort has helped her. She is willing to continue it.  - SYmbicort + albuterol prn   Pulmonary hypertension - tadalafil 20mg  - need to work on keeping her oxygenated - she has been reticent to use the oximizer due to its appearance.  - will order TTE for Dr Gala Romney

## 2012-04-30 NOTE — Assessment & Plan Note (Signed)
-   tadalafil 20mg  - need to work on keeping her oxygenated - she has been reticent to use the oximizer due to its appearance.  - will order TTE for Dr Gala Romney

## 2012-04-30 NOTE — Assessment & Plan Note (Signed)
She doesn't feel that Symbicort has helped her. She is willing to continue it.  - SYmbicort + albuterol prn

## 2012-04-30 NOTE — Patient Instructions (Addendum)
Please continue your Symbicort 2 puffs twice a day You may use albuterol as needed for shortness of breath Try to start wearing your oximizer on 5-6L/min. This will give you a higher concentration of oxygen and prevent your levels from dropping.  We will perform an Echocardiogram  Follow with Dr Delton Coombes in March 2014

## 2012-05-01 ENCOUNTER — Encounter: Payer: Self-pay | Admitting: Internal Medicine

## 2012-05-06 ENCOUNTER — Telehealth (HOSPITAL_COMMUNITY): Payer: Self-pay | Admitting: Cardiology

## 2012-05-06 NOTE — Telephone Encounter (Signed)
Pt/ pts daughter called with concerns about meds. States they were having trouble getting adcirca. Something to do with insurance.  Called to speak with member of pharmacy. No problems with med and insurance last filled 05/05/12 however med is out of refills.  Additional refills given

## 2012-05-08 DIAGNOSIS — I2789 Other specified pulmonary heart diseases: Secondary | ICD-10-CM

## 2012-05-13 ENCOUNTER — Telehealth (HOSPITAL_COMMUNITY): Payer: Self-pay | Admitting: Cardiology

## 2012-05-13 NOTE — Telephone Encounter (Signed)
Pt will need a PA for milrinone. Kendal Hymen attempted to complete online however web site was down. Please call [phone] to complete and call Kendal Hymen once this is done

## 2012-05-14 NOTE — Telephone Encounter (Signed)
Have received form and filed out for PA

## 2012-05-15 ENCOUNTER — Telehealth (HOSPITAL_COMMUNITY): Payer: Self-pay | Admitting: *Deleted

## 2012-05-15 NOTE — Telephone Encounter (Signed)
Kelly Hart called to let us know that pts wt is gradually increasing, she has gained about 7 lbs over last couple of months, he states pt feels good, no edema, breathing is ok and lungs are clear, he states pt has quit smoking so feels wt gain could just be actual weight, he will continue to monitor and let us know if it continues to increase or pt becomes symptomatic

## 2012-05-16 ENCOUNTER — Telehealth (HOSPITAL_COMMUNITY): Payer: Self-pay | Admitting: Cardiology

## 2012-05-16 NOTE — Telephone Encounter (Signed)
Please call any representative regarding PA and to address additional questions. Make sure to use reference number above

## 2012-05-19 NOTE — Telephone Encounter (Signed)
Spoke w/[facility], pt's Benay Pillow was approved through 04/15/13

## 2012-05-19 NOTE — Telephone Encounter (Signed)
See phone mess 2/3

## 2012-05-20 ENCOUNTER — Other Ambulatory Visit (HOSPITAL_COMMUNITY): Payer: Self-pay | Admitting: Emergency Medicine

## 2012-05-20 DIAGNOSIS — I2789 Other specified pulmonary heart diseases: Secondary | ICD-10-CM

## 2012-05-21 ENCOUNTER — Other Ambulatory Visit: Payer: Self-pay

## 2012-05-21 ENCOUNTER — Ambulatory Visit (HOSPITAL_COMMUNITY): Payer: Medicare Other | Attending: Cardiology | Admitting: Radiology

## 2012-05-21 DIAGNOSIS — I4891 Unspecified atrial fibrillation: Secondary | ICD-10-CM | POA: Insufficient documentation

## 2012-05-21 DIAGNOSIS — I2789 Other specified pulmonary heart diseases: Secondary | ICD-10-CM

## 2012-05-21 NOTE — Progress Notes (Signed)
Echocardiogram performed.  

## 2012-05-23 ENCOUNTER — Telehealth (HOSPITAL_COMMUNITY): Payer: Self-pay | Admitting: Cardiology

## 2012-05-23 NOTE — Telephone Encounter (Signed)
Left mess for [name] that its ok to remove their equipment if pt has her own, will contact [facility] for wheelchair, continue to monitor wt if pt asymptomatic

## 2012-05-23 NOTE — Telephone Encounter (Signed)
[name] called after home visit. Would like to know if they can remove the alert home monitor system in pts home. States she has her own scale, b/p cuff, O2 sat machine etc. Having double the equipment is really quite confusing for the pt and she would prefer to use her own.  Also an we call in a order for pt to receive a wheelchair, she does uses [facility] for home O2.  Lastly [name] wanted to let us know again that pts weight has increased by 7 lbs. States she is in no distress. Denies SOB,LE edema and increased fatigue. She is doing great despite the increase of weight.

## 2012-05-28 ENCOUNTER — Encounter: Payer: Self-pay | Admitting: Internal Medicine

## 2012-06-11 ENCOUNTER — Encounter: Payer: Self-pay | Admitting: Internal Medicine

## 2012-06-13 ENCOUNTER — Telehealth (HOSPITAL_COMMUNITY): Payer: Self-pay | Admitting: Cardiology

## 2012-06-13 NOTE — Telephone Encounter (Signed)
Requesting a rx for a small wheelchair.  Fax order ti [facility]

## 2012-06-18 NOTE — Telephone Encounter (Signed)
Per insurance pt has to be seen within 30 days for coverage of wheelchair, Kelly Hart is aware pt is sch to see [place] 3/24 and she states it can wait until then

## 2012-06-26 ENCOUNTER — Encounter: Payer: Self-pay | Admitting: Internal Medicine

## 2012-06-26 ENCOUNTER — Telehealth (HOSPITAL_COMMUNITY): Payer: Self-pay | Admitting: Cardiology

## 2012-06-26 NOTE — Telephone Encounter (Signed)
Per Dr bensimhon have pt take lasix 40 mg for 2 days, left mess for [name] with orders, called and spoke w/pt's daughter [name], she is aware and let pt know

## 2012-06-26 NOTE — Telephone Encounter (Signed)
Kelly Hart wanted to call with FYI/ update. Aware he has called in the past with weight gain (from 98lbs to 108.6 as of 06/26/12)  However now pt is experiencing increased SOB with exertion. No edema, lungs are clear, no swelling. O2 sat 98% on 5L  Should her lasix be increased or any change in orders for pt?

## 2012-07-07 ENCOUNTER — Encounter (HOSPITAL_COMMUNITY): Payer: Medicare Other

## 2012-07-07 ENCOUNTER — Ambulatory Visit (HOSPITAL_COMMUNITY)
Admission: RE | Admit: 2012-07-07 | Discharge: 2012-07-07 | Disposition: A | Discharge status: Home / Self Care | Payer: Medicare Other | Source: Ambulatory Visit | Attending: Internal Medicine | Admitting: Internal Medicine

## 2012-07-07 VITALS — BP 126/74 | HR 114 | Wt 110.5 lb

## 2012-07-07 DIAGNOSIS — I272 Pulmonary hypertension, unspecified: Secondary | ICD-10-CM

## 2012-07-07 DIAGNOSIS — I10 Essential (primary) hypertension: Secondary | ICD-10-CM | POA: Insufficient documentation

## 2012-07-07 DIAGNOSIS — I509 Heart failure, unspecified: Secondary | ICD-10-CM

## 2012-07-07 DIAGNOSIS — R5381 Other malaise: Secondary | ICD-10-CM

## 2012-07-07 DIAGNOSIS — I50810 Right heart failure, unspecified: Secondary | ICD-10-CM

## 2012-07-07 DIAGNOSIS — I2789 Other specified pulmonary heart diseases: Secondary | ICD-10-CM

## 2012-07-07 NOTE — Progress Notes (Addendum)
Dr. Robynn Pane - PCP   HPI:  Kelly Hart is a 79 year old AA female with of pulseless electrical activity arrest, right heart failure secondary to cor pulmonale,  pulmonary arterial hypertension, chronic respiratory failure, multifocal atrial tachycardia, peripheral arterial disease status post aortobifem bypass with acute occlusion of the right limb of the graft s/p thrombectomy by Dr. Arbie Cookey.  [facility] admit in February.    02/08/2011 Cardiac catheterization showed minimal coronary artery disease with 30% in the right coronary artery and 40%in the distal PDA. However, right atrial pressure, we had a mean of 16, a PA pressure of  61/30 with an EDP of 40.  Pulmonary capillary wedge pressure in the right lower lobe pulmonary artery showed a wedge of 30.  LV pressure was 101/9 with an EDP of 13.  There was significant wedged LVEDP gradient concerning for mitral stenosis.  Her Fick cardiac output was 2.0 and cardiac index was 1.4, this was on low-dose dopamine.  Her PA  saturations at that time were 39%.   05/10/11: Echo LV 50-55%.  Grade 1 diastolic dysfunction. Ventricular septum: Septal motion showed "bounce", contour showed diastolic flattening and systolic flattening.  RV moderately dilated with mildly reduced.  RA mildly dilated.  Severe TR.  Pulmonary arteries with systolic pressure mod increased.   Admitted to [facility] 01/07/12  for increased dyspnea. D/C 01/10/12 discharge weight 99 pounds. Adcirca increased 20 mg daily and she was instructed to take lasix 20 mg twice a week.  [facility] 01/09/12 on milrinone  RA = 4  RV = 56/1/5  PA = 66/24 (45)  LPCW = 13  RPCW = 11  Fick cardiac output/index = 5.6/3.8  PVR = 8.0 [place]  FA sat = 90%  PA sat = 69%, 70%  SVC sat = 69%   Echo 01/08/12: LVEF 60-70%.  Ventricular septum showed septal bounce.  RV mod-severely dilated.  Atrial septum bowed right to left.  PAPP 82 mmHG.    Continues to live at [facility] in [place]. Followed by [facility] for  PICC and Milrionone.   She returns for follow up today.  She feels well except for some dizziness that has been occurring over the last 2 weeks with increase in lasix to daily.  She continues to use O2 5L at rest and 6L with activity.Can do ADLs but winded with more activity and has to take frequent breaks.  Weight is stable.  No orthopnea or PND.    ROS: All systems negative except as listed in HPI, PMH and Problem List.  Past Medical History  Diagnosis Date  . Hypertension   . Renal insufficiency     baseline around cr 1.3  . Atrial fibrillation   . COPD (chronic obstructive pulmonary disease)   . CHF (congestive heart failure)     diastolic  . PEA (Pulseless electrical activity) Oct 2012  . PAD (peripheral artery disease)   . Hyperlipidemia   . Hypothyroidism   . History of cervical cancer   . Chronic diarrhea     secondary to treatment of her cervical cancer  . Hydronephrosis, left   . Pulmonary hypertension   . GERD (gastroesophageal reflux disease)   . Myocardial infarction     Current Outpatient Prescriptions  Medication Sig Dispense Refill  . amiodarone (PACERONE) 200 MG tablet Take 0.5 tablets (100 mg total) by mouth daily.  30 tablet  6  . amLODipine (NORVASC) 5 MG tablet Take 1 tablet (5 mg total) by mouth daily.  30 tablet  6  . aspirin EC 81 MG tablet Take 81 mg by mouth daily.        Marland Kitchen atorvastatin (LIPITOR) 40 MG tablet Take 40 mg by mouth daily.      . budesonide-formoterol (SYMBICORT) 160-4.5 MCG/ACT inhaler Inhale 2 puffs into the lungs 2 (two) times daily.  1 Inhaler  6  . digoxin (LANOXIN) 0.125 MG tablet Take 0.5 tablets (62.5 mcg total) by mouth daily.  15 tablet  6  . furosemide (LASIX) 20 MG tablet Take Mon Wed and Fri. 3 times a week  30 tablet  4  . levothyroxine (SYNTHROID, LEVOTHROID) 150 MCG tablet Take 150 mcg by mouth daily.      . magnesium oxide (MAG-OX) 400 MG tablet Take 200 mg by mouth daily.      . Milrinone in Dextrose (MILRINONE-DEXTROSE  IV) Inject 0.25 mcg/kg/min into the vein. Concentration: 1 mg/mL (140 mL bag)      . Multiple Vitamins-Minerals (MULTIVITAMINS THER. W/MINERALS) TABS Take 1 tablet by mouth daily.        . pantoprazole (PROTONIX) 40 MG tablet Take 40 mg by mouth daily.      . Tadalafil, PAH, 20 MG TABS Take 1 tablet (20 mg total) by mouth daily.  30 tablet  3  . [DISCONTINUED] zolpidem (AMBIEN) 5 MG tablet Take 1 tablet (5 mg total) by mouth at bedtime as needed for sleep.  30 tablet  1   No current facility-administered medications for this encounter.     PHYSICAL EXAM: Filed Vitals:   07/07/12 1347  BP: 126/74  Pulse: 114  Weight: 110 lb 8 oz (50.122 kg)  SpO2: 95%   General: Thin appearing. No resp difficulty wearing O2. Sitting in W-C on 5 liters [place]. HEENT: normal Neck: supple. JVP flat.   Carotids 2+ bilaterally; no bruits. No lymphadenopathy or thryomegaly appreciated. Cor: PMI normal.Tachycardic, regular rhythm. 2/6 SEM at RSB. +prominent s2. +RV lift Lungs: clear with decreased BS throughout. No wheezes.  Abdomen: soft, nontender, nondistended. No hepatosplenomegaly. No bruits or masses. Good bowel sounds. Extremities: no cyanosis, clubbing, rash, edema. RUE PICC Neuro: alert & orientedx3, cranial nerves grossly intact. Moves all 4 extremities w/o difficulty. Affect pleasant.    ASSESSMENT & PLAN:

## 2012-07-07 NOTE — Assessment & Plan Note (Addendum)
Tolerating adcirca and milrinone well.  Continue  Attending: Significantly improved with milrinone but still limited. Continue current regimen.

## 2012-07-07 NOTE — Assessment & Plan Note (Addendum)
Functional status unchanged on milrinone and Adcirca.  Weight up some but related to weight gain and not fluid.  With dizziness will cut back lasix to every other day.  Continue milrinone 0.25 mcg/kg/min.    Patient seen and examined with Ulyess Blossom, PA-C. We discussed all aspects of the encounter. I agree with the assessment and plan as stated above. She has done amazingly well on milrinone. We talked about possibility of trying to wean slowly but in reviewing her echo and how sick she was prior to milrinone therapy i have recommended against weaning. Continue current therapy except for cut back on lasix to every other day. Reinforced need for daily weights and reviewed use of sliding scale diuretics.

## 2012-07-07 NOTE — Patient Instructions (Addendum)
Follow up 4 months

## 2012-07-08 ENCOUNTER — Encounter: Payer: Self-pay | Admitting: Emergency Medicine

## 2012-07-08 ENCOUNTER — Ambulatory Visit (INDEPENDENT_AMBULATORY_CARE_PROVIDER_SITE_OTHER): Payer: Medicare Other | Admitting: Emergency Medicine

## 2012-07-08 ENCOUNTER — Telehealth: Payer: Self-pay | Admitting: Emergency Medicine

## 2012-07-08 VITALS — BP 138/78 | HR 109 | Temp 97.1°F | Ht 64.0 in | Wt 111.6 lb

## 2012-07-08 DIAGNOSIS — I2789 Other specified pulmonary heart diseases: Secondary | ICD-10-CM

## 2012-07-08 DIAGNOSIS — J449 Chronic obstructive pulmonary disease, unspecified: Secondary | ICD-10-CM

## 2012-07-08 DIAGNOSIS — I272 Pulmonary hypertension, unspecified: Secondary | ICD-10-CM

## 2012-07-08 NOTE — Telephone Encounter (Signed)
Spoke with Melissa from [facility] Patient needing order stating flow liter has increased to 5L Order placed nothing further needed at this time

## 2012-07-08 NOTE — Assessment & Plan Note (Signed)
-   will work to get her better oxygenated with oximizer (she doesn;t wear it due to the wt) - no change in symbicort - milrinone and tadalafil [per Dr Gala Romney - rov 2 months

## 2012-07-08 NOTE — Progress Notes (Signed)
Subjective:     Patient ID: Kelly Hart, female   DOB: 1932/08/15, 79 y.o.   MRN: [id number] HPI 79 yo woman, hx HTN and diastolic CHF, CRI, A Fib/MAT, hypothyroidism. Also hx tobacco use (30 pk-yrs) and possible COPD. Secondary PAH with cor pulmonale on milrinone infusion and started on tadalifil in 04/2011 at hospitalization. She is on chronic O2 at 2L/min started in 11/12. She is experiencing exertional SOB, slowly progressive.   ROV 09/28/11 -- hx HTN and diastolic CHF, CRI, A Fib/MAT, hypothyroidism. Also hx tobacco use (30 pk-yrs) and possible COPD. On tadalifil 10mg  for secondary PAH. Returns for PFT today - mixed obstruction and restriction, severe diffusion defect, no BD response.  Wearing O2 at 2L/min, forgets to increase to 3L/min with exertion.   ROV 11/30/11 -- hx HTN and diastolic CHF, CRI, A Fib/MAT, hypothyroidism. Also hx tobacco use (30 pk-yrs) and possible COPD. On tadalifil 10mg  for secondary PAH. We started spiriva in June for mixed disease. She felt that she was more SOB on the Spiriva, difficulty walking thru the house. She stopped the spiriva 2 weeks ago and feels that she is almost back to baseline. Her O2 compliance is questionable.    ROV 01/04/12 -- hx HTN and diastolic CHF, CRI, A Fib/MAT followed by Dr Gala Romney, hypothyroidism. Also hx tobacco use (30 pk-yrs) and possible COPD (mixed disease with DLCO defect). Remains on tadalifil 10mg   for her secondary PAH. Did not benefit from Spiriva, last time we did trial Symbicort to see if she would benefit. She isn't sure whether it helped her. She is severely dyspneic with exertion. Hypoxic today with walking, reports hypoxemia at rest at home on the RA.    ROV 02/27/12 -- hx HTN and diastolic CHF, CRI, A Fib/MAT followed by Dr Gala Romney, hypothyroidism. Also hx tobacco use (30 pk-yrs) and possible COPD (mixed disease with DLCO defect). We have tried spiriva and symbicort >> she was never sure that she benefited. Continues to desat  with exertion. She doesn';t always increase O2 with walking when at home. Since last time Dr Gala Romney increased tadalafil to 20mg  qd >> no change  ROV 04/30/12 -- multifactorial secondary PAH, probable COPD (not on BD's). Managed on tadalafil. Last time we decided to retry Symbicort to see if she would benefit - hasn't noticed any benefit. Also increased her O2 to 4L/min at rest, 5L/min exertion. She never got TTE wth Dr Gala Romney. She was prescribed an oximizer and increase to 6L/min w exertion but doesn't like to use it due to fit and appearance. She continues to desaturate with any activity.   ROV 07/08/12 -- multifactorial secondary PAH, probable COPD, cardiomyopathy on milrinone infusion. Managed on tadalafil 20mg . Has not benefited from BD's but willing to continue symbicort. TTE done 05/23/12 >> no change PASP ( ) from 9/13. She feels that breathing is good at rest, is very limited w exertion. She is doing pulm rehab in [place] at [facility]. She has an oximizer, uses on 5L > but rarely wears it due to the wt on her ears.      Objective:   Physical Exam Filed Vitals:   07/08/12 1128  BP: 138/78  Pulse: 109  Temp: 97.1 F (36.2 C)   Gen: Pleasant, thin, in no distress,  normal affect on O2  ENT: No lesions,  mouth clear,  oropharynx clear, no postnasal drip  Neck: No JVD, no TMG, no carotid bruits  Lungs: No use of accessory muscles, no dullness to percussion, clear without  rales or rhonchi  Cardiovascular: irregular, heart sounds normal, no murmur or gallops, no peripheral edema, milrinone infusion in place  Musculoskeletal: No deformities, no cyanosis or clubbing  Neuro: alert, non focal  Skin: Warm, no lesions or rashes   05/10/11 --  Study Conclusions  - Left ventricle: The cavity size was normal. Wall thickness was increased in a pattern of mild LVH. Systolic function was normal. The estimated ejection fraction was in the range of 50% to 55%. Doppler parameters are  consistent with abnormal left ventricular relaxation (grade 1 diastolic dysfunction). - Ventricular septum: Septal motion showed "bounce". The contour showed diastolic flattening and systolic flattening. - Right ventricle: The cavity size was moderately dilated. Systolic function was mildly reduced. - Right atrium: The atrium was mildly dilated. - Tricuspid valve: Severe regurgitation. - Pulmonary arteries: Systolic pressure was moderately Increased.  TTE 05/21/12 --  Study Conclusions  - Left ventricle: The cavity size was normal. Wall thickness was increased in a pattern of mild LVH. Systolic function was normal. The estimated ejection fraction was in the range of 55% to 60%. Wall motion was normal; there were no regional wall motion abnormalities. The study is not technically sufficient to allow evaluation of LV diastolic function. - Ventricular septum: The contour showed diastolic flattening and systolic flattening. - Right ventricle: Systolic function was mildly to moderately reduced. - Atrial septum: There was an atrial septal aneurysm. - Tricuspid valve: Severe regurgitation. - Pulmonary arteries: Systolic pressure was severely increased. PA peak pressure: 83mm Hg (S). - Pericardium, extracardiac: A trivial pericardial effusion was identified.       Assessment:     Pulmonary hypertension - will work to get her better oxygenated with oximizer (she doesn;t wear it due to the wt) - no change in symbicort - milrinone and tadalafil [per Dr Gala Romney - rov 2 months

## 2012-07-08 NOTE — Patient Instructions (Addendum)
Order placed to [facility] to increase patients oximizer --patients oxygen flow has increased Stay on your medications Please Follow Up in 2 months or sooner if needed

## 2012-07-10 ENCOUNTER — Telehealth (HOSPITAL_COMMUNITY): Payer: Self-pay | Admitting: Cardiology

## 2012-07-10 NOTE — Telephone Encounter (Signed)
Miranda PharmD spoke with [name] regarding Mrs. Bussa weight gain pt is up to 110 lb. She does have some abdominal swelling and SOB. Milrinone dose was increased from 0.7 miLs/hr to 0.8 miLs/hr per [place]. Please call if any po meds should be adjusted as well.

## 2012-07-10 NOTE — Telephone Encounter (Signed)
Pt seen 3/24 per dr Gala Romney he feels wt gain is actual wt and not fluid, lasix was cut back to QOD due to dizziness

## 2012-07-11 ENCOUNTER — Telehealth: Payer: Self-pay | Admitting: Emergency Medicine

## 2012-07-11 NOTE — Telephone Encounter (Signed)
I spoke with Melissa. Calling to confirm pt needs to be on 5 liters cont. i advised yes this is correct. Nothing further was needed

## 2012-07-16 ENCOUNTER — Telehealth: Payer: Self-pay | Admitting: Emergency Medicine

## 2012-07-16 DIAGNOSIS — J449 Chronic obstructive pulmonary disease, unspecified: Secondary | ICD-10-CM

## 2012-07-16 NOTE — Telephone Encounter (Signed)
I spoke with pt daughter. She stated [facility] received order for pt to be on 5 liters. Per daughter pt is already on 5 liters cont at home. Only time pt is on pulse is when she goes out. I advised her RB wants her to be on cont flow not pulse since she was 72% on 5L pulse when she arrived at last OV here. She voiced her understanding. She stated she is not sure if RB already knew she was on 5 liters at home. Please advise thanks

## 2012-07-16 NOTE — Telephone Encounter (Signed)
I knew she was supposed to be on 5L/min via oximizer, but it sounded like she wasn't using the oximizer because it weighed on her ears. That was the thing that I wanted changed > she needs to wear 5L on the oximizer (not standard [place])

## 2012-07-16 NOTE — Telephone Encounter (Signed)
Order was not placed for her to use the oximizer. It was placed for standard [place]. I have replaced order. I spke with daughter. She is aware to have pt use 5 liters on the oximizer. She voiced her understanding. Nothing further was needed

## 2012-07-24 ENCOUNTER — Encounter: Payer: Self-pay | Admitting: Internal Medicine

## 2012-07-31 ENCOUNTER — Encounter: Payer: Self-pay | Admitting: Internal Medicine

## 2012-08-02 ENCOUNTER — Emergency Department (HOSPITAL_COMMUNITY)
Admission: EM | Admit: 2012-08-02 | Discharge: 2012-08-02 | Disposition: A | Discharge status: Home / Self Care | Payer: Medicare Other | Attending: Emergency Medicine | Admitting: Emergency Medicine

## 2012-08-02 ENCOUNTER — Encounter (HOSPITAL_COMMUNITY): Payer: Self-pay | Admitting: Physical Medicine and Rehabilitation

## 2012-08-02 DIAGNOSIS — E039 Hypothyroidism, unspecified: Secondary | ICD-10-CM | POA: Insufficient documentation

## 2012-08-02 DIAGNOSIS — E785 Hyperlipidemia, unspecified: Secondary | ICD-10-CM | POA: Insufficient documentation

## 2012-08-02 DIAGNOSIS — Z8679 Personal history of other diseases of the circulatory system: Secondary | ICD-10-CM | POA: Insufficient documentation

## 2012-08-02 DIAGNOSIS — I129 Hypertensive chronic kidney disease with stage 1 through stage 4 chronic kidney disease, or unspecified chronic kidney disease: Secondary | ICD-10-CM | POA: Insufficient documentation

## 2012-08-02 DIAGNOSIS — I5032 Chronic diastolic (congestive) heart failure: Secondary | ICD-10-CM | POA: Insufficient documentation

## 2012-08-02 DIAGNOSIS — J4489 Other specified chronic obstructive pulmonary disease: Secondary | ICD-10-CM | POA: Insufficient documentation

## 2012-08-02 DIAGNOSIS — Z452 Encounter for adjustment and management of vascular access device: Secondary | ICD-10-CM

## 2012-08-02 DIAGNOSIS — Z79899 Other long term (current) drug therapy: Secondary | ICD-10-CM | POA: Insufficient documentation

## 2012-08-02 DIAGNOSIS — K219 Gastro-esophageal reflux disease without esophagitis: Secondary | ICD-10-CM | POA: Insufficient documentation

## 2012-08-02 DIAGNOSIS — N289 Disorder of kidney and ureter, unspecified: Secondary | ICD-10-CM | POA: Insufficient documentation

## 2012-08-02 DIAGNOSIS — Z87891 Personal history of nicotine dependence: Secondary | ICD-10-CM | POA: Insufficient documentation

## 2012-08-02 DIAGNOSIS — Z7982 Long term (current) use of aspirin: Secondary | ICD-10-CM | POA: Insufficient documentation

## 2012-08-02 DIAGNOSIS — Z8541 Personal history of malignant neoplasm of cervix uteri: Secondary | ICD-10-CM | POA: Insufficient documentation

## 2012-08-02 DIAGNOSIS — Y838 Other surgical procedures as the cause of abnormal reaction of the patient, or of later complication, without mention of misadventure at the time of the procedure: Secondary | ICD-10-CM | POA: Insufficient documentation

## 2012-08-02 DIAGNOSIS — I252 Old myocardial infarction: Secondary | ICD-10-CM | POA: Insufficient documentation

## 2012-08-02 DIAGNOSIS — Z87448 Personal history of other diseases of urinary system: Secondary | ICD-10-CM | POA: Insufficient documentation

## 2012-08-02 DIAGNOSIS — J449 Chronic obstructive pulmonary disease, unspecified: Secondary | ICD-10-CM | POA: Insufficient documentation

## 2012-08-02 DIAGNOSIS — T82598A Other mechanical complication of other cardiac and vascular devices and implants, initial encounter: Secondary | ICD-10-CM | POA: Insufficient documentation

## 2012-08-02 NOTE — ED Notes (Signed)
Spoke with iv team about pt

## 2012-08-02 NOTE — ED Notes (Signed)
Pt presents to department for evaluation of leaking PICC line. Pt has R upper arm PICC line for Milrinone infusion, states PICC line has been in place x2 years with no issues. Now states catheter is leaking and will not clamp. Denies pain. No swelling/redness noted to arm.

## 2012-08-03 ENCOUNTER — Ambulatory Visit (HOSPITAL_COMMUNITY)
Admission: RE | Admit: 2012-08-03 | Discharge: 2012-08-03 | Disposition: A | Discharge status: Home / Self Care | Payer: Medicare Other | Source: Ambulatory Visit | Attending: Interventional Radiology | Admitting: Interventional Radiology

## 2012-08-03 ENCOUNTER — Other Ambulatory Visit (HOSPITAL_COMMUNITY): Payer: Self-pay | Admitting: Interventional Radiology

## 2012-08-03 DIAGNOSIS — C439 Malignant melanoma of skin, unspecified: Secondary | ICD-10-CM | POA: Insufficient documentation

## 2012-08-03 DIAGNOSIS — T82534A Leakage of infusion catheter, initial encounter: Secondary | ICD-10-CM

## 2012-08-03 DIAGNOSIS — Y849 Medical procedure, unspecified as the cause of abnormal reaction of the patient, or of later complication, without mention of misadventure at the time of the procedure: Secondary | ICD-10-CM | POA: Insufficient documentation

## 2012-08-03 DIAGNOSIS — T82598A Other mechanical complication of other cardiac and vascular devices and implants, initial encounter: Secondary | ICD-10-CM | POA: Insufficient documentation

## 2012-08-03 DIAGNOSIS — Z452 Encounter for adjustment and management of vascular access device: Secondary | ICD-10-CM | POA: Insufficient documentation

## 2012-08-03 MED ORDER — CHLORHEXIDINE GLUCONATE 4 % EX LIQD
CUTANEOUS | Status: AC
  Filled 2012-08-03: qty 30

## 2012-08-03 NOTE — ED Provider Notes (Signed)
History     CSN: [id number]  Arrival date & time 08/02/12  1802   First MD Initiated Contact with Patient 08/02/12 1842      Chief Complaint  Patient presents with  . Vascular Access Problem    (Consider location/radiation/quality/duration/timing/severity/associated sxs/prior treatment) HPI Comments: Pt is a 79 yo woman with chronic congestive heart failure, on a continuous milrinone infusion through a PICC line in her right arm.  Today she noted leaking of fluid from the PICC line.  She therefore sought treatment.  Patient is a 79 y.o. female presenting with general illness. The history is provided by the patient. No language interpreter was used.  Illness     Past Medical History  Diagnosis Date  . Hypertension   . Renal insufficiency     baseline around cr 1.3  . Atrial fibrillation   . COPD (chronic obstructive pulmonary disease)   . CHF (congestive heart failure)     diastolic  . PEA (Pulseless electrical activity) Oct 2012  . PAD (peripheral artery disease)   . Hyperlipidemia   . Hypothyroidism   . History of cervical cancer   . Chronic diarrhea     secondary to treatment of her cervical cancer  . Hydronephrosis, left   . Pulmonary hypertension   . GERD (gastroesophageal reflux disease)   . Myocardial infarction     Past Surgical History  Procedure Laterality Date  . Aortobifem bypass grafting    . Open thrombectomy  Oct 2012  . Pr vein bypass graft,aorto-fem-pop  02/04/2009  . Peripherally inserted central catheter insertion      Family History  Problem Relation Age of Onset  . Heart failure Brother   . Heart disease Father   . Throat cancer Sister   . Emphysema Father     History  Substance Use Topics  . Smoking status: Former Smoker -- 0.50 packs/day for 50 years    Types: Cigarettes    Quit date: 01/15/2011  . Smokeless tobacco: Never Used  . Alcohol Use: No    OB History   Grav Para Term Preterm Abortions TAB SAB Ect Mult Living             Review of Systems  All other systems reviewed and are negative.    Allergies  Iodine; Lisinopril; Other; and Penicillins  Home Medications   Current Outpatient Rx  Name  Route  Sig  Dispense  Refill  . albuterol (PROVENTIL) (2.5 MG/3ML) 0.083% nebulizer solution   Nebulization   Take 2.5 mg by nebulization daily as needed for wheezing.         Marland Kitchen amiodarone (PACERONE) 200 MG tablet   Oral   Take 0.5 tablets (100 mg total) by mouth daily.   30 tablet   6   . amLODipine (NORVASC) 10 MG tablet   Oral   Take 5 mg by mouth daily.         Marland Kitchen aspirin EC 81 MG tablet   Oral   Take 81 mg by mouth daily.           Marland Kitchen atorvastatin (LIPITOR) 40 MG tablet   Oral   Take 40 mg by mouth at bedtime.          . budesonide-formoterol (SYMBICORT) 160-4.5 MCG/ACT inhaler   Inhalation   Inhale 2 puffs into the lungs 2 (two) times daily.   1 Inhaler   6   . digoxin (LANOXIN) 0.125 MG tablet   Oral   Take 0.5  tablets (62.5 mcg total) by mouth daily.   15 tablet   6   . diphenhydramine-acetaminophen (TYLENOL PM) 25-500 MG TABS   Oral   Take 1 tablet by mouth at bedtime as needed (for sleep).          . furosemide (LASIX) 20 MG tablet   Oral   Take 20 mg by mouth 3 (three) times a week. Takes 20mg  on mon, wed, and fri each week.         . levothyroxine (SYNTHROID, LEVOTHROID) 150 MCG tablet   Oral   Take 150 mcg by mouth daily.         . magnesium oxide (MAG-OX) 400 MG tablet   Oral   Take 200 mg by mouth daily.         . Milrinone in Dextrose (MILRINONE-DEXTROSE IV)   Intravenous   Inject 0.25 mcg/kg/min into the vein. Concentration: 1 mg/mL (140 mL bag)         . Multiple Vitamins-Minerals (MULTIVITAMINS THER. W/MINERALS) TABS   Oral   Take 1 tablet by mouth daily.           . pantoprazole (PROTONIX) 40 MG tablet   Oral   Take 40 mg by mouth daily.         . Tadalafil, PAH, 20 MG TABS   Oral   Take 1 tablet (20 mg total) by mouth  daily.   30 tablet   3     BP 154/93  Pulse 93  Temp(Src) 97.4 F (36.3 C) (Oral)  Resp 20  SpO2 96%  Physical Exam  Nursing note and vitals reviewed. Constitutional:  Pleasant elderly lady in no distress.  Musculoskeletal:  She has a PICC line in her right upper arm.  The line is leaking slightly from the catheter where it joins to the IV infusion device's port.   DISP:   I called the Radiology Department, and they contacted the Interventional Radiology PA, who gave the patient an appointment to be seen at 10:30 A.M. Tomorrow to have the PICC line repaired or changed out.  ED Course  Procedures (including critical care time)  Labs Reviewed - No data to display Ir Fluoro Guide Cv Line Right  08/03/2012  *RADIOLOGY REPORT*  PICC EXCHANGE UNDER FLUOROSCOPIC GUIDANCE  Clinical History:  79 year old female with a right upper extremity PICC for continuous melanoma infusion.  The external portion of the PICC has become cracked and is leaking fluid.  She presents for catheter exchange under fluoroscopy.  Fluoroscopy Time: 2 minutes minutes.  Procedure:  The  arm was prepped with chlorhexidine, draped in the usual sterile fashion using maximum barrier technique (cap and mask, sterile gown, sterile gloves, large sterile sheet, hand hygiene and cutaneous antiseptic).  Local anesthesia was attained by infiltration with 1% lidocaine.  The existing PICC was cut and a 0.018-inch wire advanced into the right atrium.  The PICC was removed over the wire.  A new 34 cm cm 5 [place] triple lumen power injectable PICC was advanced, and positioned with its tip at the lower SVC/right atrial junction. Fluoroscopy during the procedure and fluoro spot radiograph confirms appropriate catheter position.  The catheter was flushed, secured to the skin with Prolene sutures, and covered with a sterile dressing.  Complications:  None.  The patient tolerated the procedure well.  IMPRESSION: Successful exchange of right  arm PICC with sonographic and fluoroscopic guidance.  The catheter is ready for use.  Signed,  Sterling Big, MD  Vascular & Interventional Radiologist [facility] Radiology   Original Report Authenticated By: Malachy Moan, [name]      1. PICC (peripherally inserted central catheter) in place            Carleene Cooper III, MD 08/03/12 1238

## 2012-08-06 DIAGNOSIS — R5381 Other malaise: Secondary | ICD-10-CM | POA: Insufficient documentation

## 2012-08-06 NOTE — Assessment & Plan Note (Signed)
She can do ADLs but overall but needs frequent rest breaks due to right heart failure, O2 dependent COPD and PAD. I do not think a walker or cane will be sufficient to improve her mobility much as she has failed these in the past. We have recommended a lightweight wheelchair to help her ambulate and complete her daily activities both at home and outside. She is to frail to use a standard wheelchair and so we have specifically requested she apply for lightweight wheelchair which I think will help her significantly. We will help her fill out the appropriate paperwork.

## 2012-08-06 NOTE — Addendum Note (Signed)
Encounter addended by: Dolores Patty, MD on: 08/06/2012  4:29 PM<BR>     Documentation filed: LOS Section, Problem List, Notes Section

## 2012-08-07 ENCOUNTER — Telehealth (HOSPITAL_COMMUNITY): Payer: Self-pay | Admitting: Cardiology

## 2012-08-07 DIAGNOSIS — I5033 Acute on chronic diastolic (congestive) heart failure: Secondary | ICD-10-CM

## 2012-08-07 DIAGNOSIS — R5381 Other malaise: Secondary | ICD-10-CM

## 2012-08-07 NOTE — Telephone Encounter (Signed)
Order placed for light weight wheel chair. 

## 2012-08-12 ENCOUNTER — Encounter: Payer: Self-pay | Admitting: Internal Medicine

## 2012-08-14 ENCOUNTER — Encounter: Payer: Self-pay | Admitting: Internal Medicine

## 2012-08-15 ENCOUNTER — Telehealth (HOSPITAL_COMMUNITY): Payer: Self-pay | Admitting: Adult Health

## 2012-08-15 DIAGNOSIS — I5033 Acute on chronic diastolic (congestive) heart failure: Secondary | ICD-10-CM

## 2012-08-15 DIAGNOSIS — N189 Chronic kidney disease, unspecified: Secondary | ICD-10-CM

## 2012-08-15 DIAGNOSIS — I129 Hypertensive chronic kidney disease with stage 1 through stage 4 chronic kidney disease, or unspecified chronic kidney disease: Secondary | ICD-10-CM

## 2012-08-15 MED ORDER — POTASSIUM CHLORIDE CRYS ER 20 MEQ PO TBCR: 20.0000 meq | EXTENDED_RELEASE_TABLET | ORAL | Status: DC

## 2012-08-15 NOTE — Telephone Encounter (Signed)
Provided lab results.  Instructed to take 20 meq of potassium Mon-Wed-Fri whic is ht e days she takes her lasix.   HH to repeat BMET next week.   Mrs Pooley verbalized understanding.   CLEGG,AMY NP-C 12:17 PM

## 2012-08-31 ENCOUNTER — Emergency Department (HOSPITAL_COMMUNITY)
Admission: EM | Admit: 2012-08-31 | Discharge: 2012-08-31 | Disposition: A | Discharge status: Home / Self Care | Payer: Medicare Other | Attending: Emergency Medicine | Admitting: Emergency Medicine

## 2012-08-31 ENCOUNTER — Emergency Department (HOSPITAL_COMMUNITY): Payer: Medicare Other

## 2012-08-31 ENCOUNTER — Encounter (HOSPITAL_COMMUNITY): Payer: Self-pay | Admitting: Nurse Practitioner

## 2012-08-31 DIAGNOSIS — J4489 Other specified chronic obstructive pulmonary disease: Secondary | ICD-10-CM | POA: Insufficient documentation

## 2012-08-31 DIAGNOSIS — Z8541 Personal history of malignant neoplasm of cervix uteri: Secondary | ICD-10-CM | POA: Insufficient documentation

## 2012-08-31 DIAGNOSIS — R42 Dizziness and giddiness: Secondary | ICD-10-CM | POA: Insufficient documentation

## 2012-08-31 DIAGNOSIS — Z880 Allergy status to penicillin: Secondary | ICD-10-CM | POA: Insufficient documentation

## 2012-08-31 DIAGNOSIS — K219 Gastro-esophageal reflux disease without esophagitis: Secondary | ICD-10-CM | POA: Insufficient documentation

## 2012-08-31 DIAGNOSIS — Z87891 Personal history of nicotine dependence: Secondary | ICD-10-CM | POA: Insufficient documentation

## 2012-08-31 DIAGNOSIS — I5030 Unspecified diastolic (congestive) heart failure: Secondary | ICD-10-CM | POA: Insufficient documentation

## 2012-08-31 DIAGNOSIS — I10 Essential (primary) hypertension: Secondary | ICD-10-CM | POA: Insufficient documentation

## 2012-08-31 DIAGNOSIS — I252 Old myocardial infarction: Secondary | ICD-10-CM | POA: Insufficient documentation

## 2012-08-31 DIAGNOSIS — Z8719 Personal history of other diseases of the digestive system: Secondary | ICD-10-CM | POA: Insufficient documentation

## 2012-08-31 DIAGNOSIS — Z79899 Other long term (current) drug therapy: Secondary | ICD-10-CM | POA: Insufficient documentation

## 2012-08-31 DIAGNOSIS — Z87448 Personal history of other diseases of urinary system: Secondary | ICD-10-CM | POA: Insufficient documentation

## 2012-08-31 DIAGNOSIS — Z7982 Long term (current) use of aspirin: Secondary | ICD-10-CM | POA: Insufficient documentation

## 2012-08-31 DIAGNOSIS — I4891 Unspecified atrial fibrillation: Secondary | ICD-10-CM | POA: Insufficient documentation

## 2012-08-31 DIAGNOSIS — E785 Hyperlipidemia, unspecified: Secondary | ICD-10-CM | POA: Insufficient documentation

## 2012-08-31 DIAGNOSIS — J449 Chronic obstructive pulmonary disease, unspecified: Secondary | ICD-10-CM | POA: Insufficient documentation

## 2012-08-31 DIAGNOSIS — R110 Nausea: Secondary | ICD-10-CM | POA: Insufficient documentation

## 2012-08-31 DIAGNOSIS — Z9889 Other specified postprocedural states: Secondary | ICD-10-CM | POA: Insufficient documentation

## 2012-08-31 DIAGNOSIS — Z8679 Personal history of other diseases of the circulatory system: Secondary | ICD-10-CM | POA: Insufficient documentation

## 2012-08-31 DIAGNOSIS — E039 Hypothyroidism, unspecified: Secondary | ICD-10-CM | POA: Insufficient documentation

## 2012-08-31 LAB — CBC
Hemoglobin: 11.4 g/dL — ABNORMAL LOW (ref 12.0–15.0)
MCH: 29.3 pg (ref 26.0–34.0)
MCHC: 33.3 g/dL (ref 30.0–36.0)
Platelets: 177 10*3/uL (ref 150–400)
RDW: 14.8 % (ref 11.5–15.5)

## 2012-08-31 LAB — BASIC METABOLIC PANEL
Calcium: 9.4 mg/dL (ref 8.4–10.5)
GFR calc Af Amer: 40 mL/min — ABNORMAL LOW (ref >90)
GFR calc non Af Amer: 34 mL/min — ABNORMAL LOW (ref >90)
Glucose, Bld: 89 mg/dL (ref 70–99)
Sodium: 139 mEq/L (ref 135–145)

## 2012-08-31 LAB — POCT I-STAT TROPONIN I

## 2012-08-31 MED ORDER — LORAZEPAM 1 MG PO TABS: 0.5000 mg | ORAL_TABLET | Freq: Two times a day (BID) | ORAL | Status: DC | PRN

## 2012-08-31 MED ORDER — MECLIZINE HCL 25 MG PO TABS
25.0000 mg | ORAL_TABLET | Freq: Once | ORAL | Status: AC
  Administered 2012-08-31: 25 mg via ORAL
  Filled 2012-08-31: qty 1

## 2012-08-31 MED ORDER — MECLIZINE HCL 50 MG PO TABS: 25.0000 mg | ORAL_TABLET | Freq: Three times a day (TID) | ORAL | Status: DC | PRN

## 2012-08-31 NOTE — ED Provider Notes (Addendum)
History     CSN: [id number]  Arrival date & time 08/31/12  1244   First MD Initiated Contact with Patient 08/31/12 1324      Chief Complaint  Patient presents with  . Dizziness    (Consider location/radiation/quality/duration/timing/severity/associated sxs/prior treatment) HPI .Marland Kitchen... sense of dizziness since Thursday especially worsened by ambulation. Complains of nausea, but no chest pain, dyspnea, slurred speech, arm or leg weakness, fever, chills, meningeal signs.  Severity is moderate. Laying still improve symptoms. Past medical history well documented.   Past Medical History  Diagnosis Date  . Hypertension   . Renal insufficiency     baseline around cr 1.3  . Atrial fibrillation   . COPD (chronic obstructive pulmonary disease)   . CHF (congestive heart failure)     diastolic  . PEA (Pulseless electrical activity) Oct 2012  . PAD (peripheral artery disease)   . Hyperlipidemia   . Hypothyroidism   . History of cervical cancer   . Chronic diarrhea     secondary to treatment of her cervical cancer  . Hydronephrosis, left   . Pulmonary hypertension   . GERD (gastroesophageal reflux disease)   . Myocardial infarction     Past Surgical History  Procedure Laterality Date  . Aortobifem bypass grafting    . Open thrombectomy  Oct 2012  . Pr vein bypass graft,aorto-fem-pop  02/04/2009  . Peripherally inserted central catheter insertion      Family History  Problem Relation Age of Onset  . Heart failure Brother   . Heart disease Father   . Throat cancer Sister   . Emphysema Father     History  Substance Use Topics  . Smoking status: Former Smoker -- 0.50 packs/day for 50 years    Types: Cigarettes    Quit date: 01/15/2011  . Smokeless tobacco: Never Used  . Alcohol Use: No    OB History   Grav Para Term Preterm Abortions TAB SAB Ect Mult Living                  Review of Systems  All other systems reviewed and are negative.    Allergies  Iodine;  Lisinopril; Other; and Penicillins  Home Medications   Current Outpatient Rx  Name  Route  Sig  Dispense  Refill  . albuterol (PROVENTIL) (2.5 MG/3ML) 0.083% nebulizer solution   Nebulization   Take 2.5 mg by nebulization every 6 (six) hours as needed for wheezing or shortness of breath.          Marland Kitchen amiodarone (PACERONE) 200 MG tablet   Oral   Take 0.5 tablets (100 mg total) by mouth daily.   30 tablet   6   . amLODipine (NORVASC) 10 MG tablet   Oral   Take 5 mg by mouth daily.         Marland Kitchen aspirin EC 81 MG tablet   Oral   Take 81 mg by mouth daily.           Marland Kitchen atorvastatin (LIPITOR) 40 MG tablet   Oral   Take 40 mg by mouth at bedtime.          . budesonide-formoterol (SYMBICORT) 160-4.5 MCG/ACT inhaler   Inhalation   Inhale 2 puffs into the lungs 2 (two) times daily.   1 Inhaler   6   . digoxin (LANOXIN) 0.125 MG tablet   Oral   Take 0.5 tablets (62.5 mcg total) by mouth daily.   15 tablet   6   .  diphenhydramine-acetaminophen (TYLENOL PM) 25-500 MG TABS   Oral   Take 1 tablet by mouth at bedtime as needed (for sleep).          . furosemide (LASIX) 20 MG tablet   Oral   Take 20 mg by mouth every Monday, Wednesday, and Friday.          . levothyroxine (SYNTHROID, LEVOTHROID) 150 MCG tablet   Oral   Take 150 mcg by mouth daily.         . magnesium oxide (MAG-OX) 400 MG tablet   Oral   Take 200 mg by mouth daily.         . Milrinone in Dextrose (MILRINONE-DEXTROSE IV)   Intravenous   Inject 0.25 mcg/kg/min into the vein. Concentration: 1 mg/mL (140 mL bag)         . Multiple Vitamins-Minerals (MULTIVITAMINS THER. W/MINERALS) TABS   Oral   Take 1 tablet by mouth daily.           . pantoprazole (PROTONIX) 40 MG tablet   Oral   Take 40 mg by mouth daily.         . potassium chloride SA (K-DUR,KLOR-CON) 20 MEQ tablet   Oral   Take 20 mEq by mouth every Monday, Wednesday, and Friday.         . Tadalafil, PAH, 20 MG TABS   Oral    Take 1 tablet (20 mg total) by mouth daily.   30 tablet   3   . LORazepam (ATIVAN) 1 MG tablet   Oral   Take 0.5 tablets (0.5 mg total) by mouth 2 (two) times daily as needed for anxiety (or dizziness).   15 tablet   0   . meclizine (ANTIVERT) 50 MG tablet   Oral   Take 0.5 tablets (25 mg total) by mouth 3 (three) times daily as needed for dizziness.   20 tablet   0     BP 143/81  Pulse 106  Temp(Src) 98.2 F (36.8 C) (Oral)  Resp 20  SpO2 97%  Physical Exam  Nursing note and vitals reviewed. Constitutional: She is oriented to person, place, and time. She appears well-developed and well-nourished.  HENT:  Head: Normocephalic and atraumatic.  Eyes: Conjunctivae and EOM are normal. Pupils are equal, round, and reactive to light.  Neck: Normal range of motion. Neck supple.  Cardiovascular: Normal rate, regular rhythm and normal heart sounds.   Pulmonary/Chest: Effort normal and breath sounds normal.  Abdominal: Soft. Bowel sounds are normal.  Musculoskeletal: Normal range of motion.  Neurological: She is alert and oriented to person, place, and time.  Skin: Skin is warm and dry.  Psychiatric: She has a normal mood and affect.    ED Course  Procedures (including critical care time)  Labs Reviewed  CBC - Abnormal; Notable for the following:    Hemoglobin 11.4 (*)    HCT 34.2 (*)    All other components within normal limits  BASIC METABOLIC PANEL - Abnormal; Notable for the following:    Creatinine, Ser 1.43 (*)    GFR calc non Af Amer 34 (*)    GFR calc Af Amer 40 (*)    All other components within normal limits  POCT I-STAT TROPONIN I   Ct Head Wo Contrast  08/31/2012   *RADIOLOGY REPORT*  Clinical Data: Dizziness and hypertension.  History heart failure.  CT HEAD WITHOUT CONTRAST  Technique:  Contiguous axial images were obtained from the base of the skull through  the vertex without contrast.  Comparison: None.  Findings: Bone windows demonstrate clear paranasal  sinuses and mastoid air cells.  Soft tissue windows demonstrate mild low density in the periventricular white matter likely related to small vessel disease.Mild cerebellar atrophy.  Physiologic calcifications in the basal ganglia.No  mass lesion, hemorrhage, hydrocephalus, acute infarct, intra-axial, or extra-axial fluid collection.  IMPRESSION: No acute intracranial abnormality.  Mild small vessel ischemic change with cerebellar atrophy.   Original Report Authenticated By: Jeronimo Greaves, [name]   Date: 08/31/2012  Rate: 107  Rhythm: sinus tachycardia  QRS Axis: normal  Intervals: normal  ST/T Wave abnormalities: normal  Conduction Disutrbances:left anterior fascicular block  Narrative Interpretation:   Old EKG Reviewed: changes noted    1. Vertigo       MDM  History and physical consistent with vertigo. CT head negative. Patient is not confused. Has full range of motion of arms and legs without deficits.  Discharge home on meclizine 25 mg 3 times a day when necessary #20 and Ativan 0.5 mg 3 times a day when necessary #15        Donnetta Hutching, MD 08/31/12 1610  Donnetta Hutching, MD 09/03/12 1650

## 2012-08-31 NOTE — ED Notes (Addendum)
States she has been dizzy since Thursday, "especially when I walk, it feels like im staggering." pt denies pain. States she has felt nauseated but denies any other complaints. Pt is A&Ox4, grips = bilateral, no arm drift or facial droop. Pt on milrinone infusion and 5L [place].

## 2012-09-10 ENCOUNTER — Ambulatory Visit (INDEPENDENT_AMBULATORY_CARE_PROVIDER_SITE_OTHER): Payer: Medicare Other | Admitting: Emergency Medicine

## 2012-09-10 ENCOUNTER — Encounter: Payer: Self-pay | Admitting: Emergency Medicine

## 2012-09-10 VITALS — BP 122/74 | HR 111 | Temp 97.9°F | Ht 64.0 in | Wt 111.6 lb

## 2012-09-10 DIAGNOSIS — I272 Pulmonary hypertension, unspecified: Secondary | ICD-10-CM

## 2012-09-10 DIAGNOSIS — I2789 Other specified pulmonary heart diseases: Secondary | ICD-10-CM

## 2012-09-10 DIAGNOSIS — J449 Chronic obstructive pulmonary disease, unspecified: Secondary | ICD-10-CM

## 2012-09-10 NOTE — Assessment & Plan Note (Addendum)
Due to L sided disease, probable lung disease and leading to R heart failure, severe hypoxemia.  - BP continues to tolerate the tadalafil, will continue  - discussed her o2 use in detail, encouraged her to wear the oximizer w exertion (really at all times).

## 2012-09-10 NOTE — Assessment & Plan Note (Addendum)
Unclear how much contribution of obstructive disease to her dyspnea and her constitutional sx. She doesn't see that Symbicort helps her. She is using albuterol nebs 2-3 x a day - continue symbicort for now - albuterol prn.

## 2012-09-10 NOTE — Progress Notes (Signed)
Subjective:     Patient ID: Kelly Hart, female   DOB: May 25, 1932, 79 y.o.   MRN: [id number] HPI 79 yo woman, hx HTN and diastolic CHF, CRI, A Fib/MAT, hypothyroidism. Also hx tobacco use (30 pk-yrs) and possible COPD. Secondary PAH with cor pulmonale on milrinone infusion and started on tadalifil in 04/2011 at hospitalization. She is on chronic O2 at 2L/min started in 11/12. She is experiencing exertional SOB, slowly progressive.   ROV 09/28/11 -- hx HTN and diastolic CHF, CRI, A Fib/MAT, hypothyroidism. Also hx tobacco use (30 pk-yrs) and possible COPD. On tadalifil 10mg  for secondary PAH. Returns for PFT today - mixed obstruction and restriction, severe diffusion defect, no BD response.  Wearing O2 at 2L/min, forgets to increase to 3L/min with exertion.   ROV 11/30/11 -- hx HTN and diastolic CHF, CRI, A Fib/MAT, hypothyroidism. Also hx tobacco use (30 pk-yrs) and possible COPD. On tadalifil 10mg  for secondary PAH. We started spiriva in June for mixed disease. She felt that she was more SOB on the Spiriva, difficulty walking thru the house. She stopped the spiriva 2 weeks ago and feels that she is almost back to baseline. Her O2 compliance is questionable.    ROV 01/04/12 -- hx HTN and diastolic CHF, CRI, A Fib/MAT followed by Dr Gala Romney, hypothyroidism. Also hx tobacco use (30 pk-yrs) and possible COPD (mixed disease with DLCO defect). Remains on tadalifil 10mg   for her secondary PAH. Did not benefit from Spiriva, last time we did trial Symbicort to see if she would benefit. She isn't sure whether it helped her. She is severely dyspneic with exertion. Hypoxic today with walking, reports hypoxemia at rest at home on the RA.    ROV 02/27/12 -- hx HTN and diastolic CHF, CRI, A Fib/MAT followed by Dr Gala Romney, hypothyroidism. Also hx tobacco use (30 pk-yrs) and possible COPD (mixed disease with DLCO defect). We have tried spiriva and symbicort >> she was never sure that she benefited. Continues to desat  with exertion. She doesn';t always increase O2 with walking when at home. Since last time Dr Gala Romney increased tadalafil to 20mg  qd >> no change  ROV 04/30/12 -- multifactorial secondary PAH, probable COPD (not on BD's). Managed on tadalafil. Last time we decided to retry Symbicort to see if she would benefit - hasn't noticed any benefit. Also increased her O2 to 4L/min at rest, 5L/min exertion. She never got TTE wth Dr Gala Romney. She was prescribed an oximizer and increase to 6L/min w exertion but doesn't like to use it due to fit and appearance. She continues to desaturate with any activity.   ROV 07/08/12 -- multifactorial secondary PAH, probable COPD, cardiomyopathy on milrinone infusion. Managed on tadalafil 20mg . Has not benefited from BD's but willing to continue symbicort. TTE done 05/23/12 >> no change PASP ( ) from 9/13. She feels that breathing is good at rest, is very limited w exertion. She is doing pulm rehab in [place] at [place]. She has an oximizer, uses on 5L > but rarely wears it due to the wt on her ears.  ROV 09/10/12 -- multifactorial secondary PAH, probable COPD (mixed disease on spiro), cardiomyopathy on milrinone infusion, tadalafil 20mg . Latest o2 orders are for 5L/min via oximizer.  Was seen in ED 5/18 for ? Vertigo > experienced imbalance, no actual falls x 3 days. Improved w antivert, but does raise the question of hypoxemia, hypotension, etc. She admits to not wearing her oximizer. Denies dyspnea but she knows that she drops to the 70's w  exertion.       Objective:   Physical Exam Filed Vitals:   09/10/12 1333  BP: 122/74  Pulse: 111  Temp: 97.9 F (36.6 C)   Gen: Pleasant, thin, in no distress,  normal affect on O2  ENT: No lesions,  mouth clear,  oropharynx clear, no postnasal drip  Neck: No JVD, no TMG, no carotid bruits  Lungs: No use of accessory muscles, no dullness to percussion, clear without rales or rhonchi  Cardiovascular: irregular, heart sounds  normal, no murmur or gallops, no peripheral edema, milrinone infusion in place  Musculoskeletal: No deformities, no cyanosis or clubbing  Neuro: alert, non focal  Skin: Warm, no lesions or rashes  BMET    Component Value Date/Time   NA 139 08/31/2012 1400   K 4.1 08/31/2012 1400   CL 108 08/31/2012 1400   CO2 20 08/31/2012 1400   GLUCOSE 89 08/31/2012 1400   BUN 18 08/31/2012 1400   CREATININE 1.43* 08/31/2012 1400   CALCIUM 9.4 08/31/2012 1400   GFRNONAA 34* 08/31/2012 1400   GFRAA 40* 08/31/2012 1400   CBC    Component Value Date/Time   WBC 7.6 08/31/2012 1400   RBC 3.89 08/31/2012 1400   HGB 11.4* 08/31/2012 1400   HCT 34.2* 08/31/2012 1400   PLT 177 08/31/2012 1400   MCV 87.9 08/31/2012 1400   MCH 29.3 08/31/2012 1400   MCHC 33.3 08/31/2012 1400   RDW 14.8 08/31/2012 1400   LYMPHSABS 1.6 01/07/2012 2240   MONOABS 0.6 01/07/2012 2240   EOSABS 0.1 01/07/2012 2240   BASOSABS 0.0 01/07/2012 2240    05/10/11 --  Study Conclusions - Left ventricle: The cavity size was normal. Wall thickness was increased in a pattern of mild LVH. Systolic function was normal. The estimated ejection fraction was in the range of 50% to 55%. Doppler parameters are consistent with abnormal left ventricular relaxation (grade 1 diastolic dysfunction). - Ventricular septum: Septal motion showed "bounce". The contour showed diastolic flattening and systolic flattening. - Right ventricle: The cavity size was moderately dilated. Systolic function was mildly reduced. - Right atrium: The atrium was mildly dilated. - Tricuspid valve: Severe regurgitation. - Pulmonary arteries: Systolic pressure was moderately Increased.  TTE 05/21/12 --  Study Conclusions - Left ventricle: The cavity size was normal. Wall thickness was increased in a pattern of mild LVH. Systolic function was normal. The estimated ejection fraction was in the range of 55% to 60%. Wall motion was normal; there were no regional wall motion  abnormalities. The study is not technically sufficient to allow evaluation of LV diastolic function. - Ventricular septum: The contour showed diastolic flattening and systolic flattening. - Right ventricle: Systolic function was mildly to moderately reduced. - Atrial septum: There was an atrial septal aneurysm. - Tricuspid valve: Severe regurgitation. - Pulmonary arteries: Systolic pressure was severely increased. PA peak pressure: 83mm Hg (S). - Pericardium, extracardiac: A trivial pericardial effusion was identified.      Assessment:     COPD (chronic obstructive pulmonary disease) Unclear how much contribution of obstructive disease to her dyspnea and her constitutional sx. She doesn't see that Symbicort helps her. She is using albuterol nebs 2-3 x a day - continue symbicort for now - albuterol prn.    Pulmonary hypertension Due to L sided disease, probable lung disease and leading to R heart failure, severe hypoxemia.  - BP continues to tolerate the tadalafil, will continue  - discussed her o2 use in detail, encouraged her to wear the  oximizer w exertion (really at all times).

## 2012-09-10 NOTE — Patient Instructions (Addendum)
Please continue your symbicort twice a day Use albuterol as needed Wear your oxygen at all time. You have shown that you can tolerate 5L/min pulsed at rest, but you REALLY NEED to USE 5L/min continuous flow and an oximizer when you exert yourself. This will help your heart function, slow the worsening of your pulmonary hypertension, and allow you to live better and longer.  Follow with Dr Delton Coombes in 3 months or sooner if you have any problems.

## 2012-09-11 ENCOUNTER — Encounter: Payer: Self-pay | Admitting: Internal Medicine

## 2012-09-25 ENCOUNTER — Encounter: Payer: Self-pay | Admitting: Internal Medicine

## 2012-10-14 DIAGNOSIS — Z029 Encounter for administrative examinations, unspecified: Secondary | ICD-10-CM

## 2012-10-23 ENCOUNTER — Encounter: Payer: Self-pay | Admitting: Internal Medicine

## 2012-10-28 ENCOUNTER — Ambulatory Visit (HOSPITAL_COMMUNITY)
Admission: RE | Admit: 2012-10-28 | Discharge: 2012-10-28 | Disposition: A | Discharge status: Home / Self Care | Payer: Medicare Other | Source: Ambulatory Visit | Attending: Cardiology | Admitting: Cardiology

## 2012-10-28 ENCOUNTER — Encounter (HOSPITAL_COMMUNITY): Payer: Self-pay

## 2012-10-28 ENCOUNTER — Other Ambulatory Visit: Payer: Self-pay

## 2012-10-28 ENCOUNTER — Encounter (HOSPITAL_COMMUNITY): Payer: Self-pay | Admitting: Family Medicine

## 2012-10-28 ENCOUNTER — Emergency Department (HOSPITAL_COMMUNITY)
Admission: EM | Admit: 2012-10-28 | Discharge: 2012-10-28 | Disposition: A | Discharge status: Home / Self Care | Payer: Medicare Other | Attending: Emergency Medicine | Admitting: Emergency Medicine

## 2012-10-28 ENCOUNTER — Emergency Department (HOSPITAL_COMMUNITY): Payer: Medicare Other

## 2012-10-28 VITALS — BP 110/70 | HR 114 | Wt 106.8 lb

## 2012-10-28 DIAGNOSIS — I5033 Acute on chronic diastolic (congestive) heart failure: Secondary | ICD-10-CM | POA: Insufficient documentation

## 2012-10-28 DIAGNOSIS — K219 Gastro-esophageal reflux disease without esophagitis: Secondary | ICD-10-CM | POA: Insufficient documentation

## 2012-10-28 DIAGNOSIS — K5289 Other specified noninfective gastroenteritis and colitis: Secondary | ICD-10-CM | POA: Insufficient documentation

## 2012-10-28 DIAGNOSIS — R0600 Dyspnea, unspecified: Secondary | ICD-10-CM

## 2012-10-28 DIAGNOSIS — C439 Malignant melanoma of skin, unspecified: Secondary | ICD-10-CM | POA: Insufficient documentation

## 2012-10-28 DIAGNOSIS — I272 Pulmonary hypertension, unspecified: Secondary | ICD-10-CM

## 2012-10-28 DIAGNOSIS — R0609 Other forms of dyspnea: Secondary | ICD-10-CM | POA: Insufficient documentation

## 2012-10-28 DIAGNOSIS — J4489 Other specified chronic obstructive pulmonary disease: Secondary | ICD-10-CM | POA: Insufficient documentation

## 2012-10-28 DIAGNOSIS — Z880 Allergy status to penicillin: Secondary | ICD-10-CM | POA: Insufficient documentation

## 2012-10-28 DIAGNOSIS — I10 Essential (primary) hypertension: Secondary | ICD-10-CM | POA: Insufficient documentation

## 2012-10-28 DIAGNOSIS — Z87891 Personal history of nicotine dependence: Secondary | ICD-10-CM | POA: Insufficient documentation

## 2012-10-28 DIAGNOSIS — Z8541 Personal history of malignant neoplasm of cervix uteri: Secondary | ICD-10-CM | POA: Insufficient documentation

## 2012-10-28 DIAGNOSIS — K529 Noninfective gastroenteritis and colitis, unspecified: Secondary | ICD-10-CM

## 2012-10-28 DIAGNOSIS — Z8679 Personal history of other diseases of the circulatory system: Secondary | ICD-10-CM | POA: Insufficient documentation

## 2012-10-28 DIAGNOSIS — Z87448 Personal history of other diseases of urinary system: Secondary | ICD-10-CM | POA: Insufficient documentation

## 2012-10-28 DIAGNOSIS — R0989 Other specified symptoms and signs involving the circulatory and respiratory systems: Secondary | ICD-10-CM | POA: Insufficient documentation

## 2012-10-28 DIAGNOSIS — I2789 Other specified pulmonary heart diseases: Secondary | ICD-10-CM

## 2012-10-28 DIAGNOSIS — I4891 Unspecified atrial fibrillation: Secondary | ICD-10-CM | POA: Insufficient documentation

## 2012-10-28 DIAGNOSIS — I5042 Chronic combined systolic (congestive) and diastolic (congestive) heart failure: Secondary | ICD-10-CM | POA: Insufficient documentation

## 2012-10-28 DIAGNOSIS — J449 Chronic obstructive pulmonary disease, unspecified: Secondary | ICD-10-CM

## 2012-10-28 DIAGNOSIS — R0602 Shortness of breath: Secondary | ICD-10-CM | POA: Insufficient documentation

## 2012-10-28 DIAGNOSIS — I252 Old myocardial infarction: Secondary | ICD-10-CM | POA: Insufficient documentation

## 2012-10-28 DIAGNOSIS — Z7982 Long term (current) use of aspirin: Secondary | ICD-10-CM | POA: Insufficient documentation

## 2012-10-28 DIAGNOSIS — E039 Hypothyroidism, unspecified: Secondary | ICD-10-CM | POA: Insufficient documentation

## 2012-10-28 DIAGNOSIS — R197 Diarrhea, unspecified: Secondary | ICD-10-CM | POA: Insufficient documentation

## 2012-10-28 DIAGNOSIS — E785 Hyperlipidemia, unspecified: Secondary | ICD-10-CM | POA: Insufficient documentation

## 2012-10-28 DIAGNOSIS — Z79899 Other long term (current) drug therapy: Secondary | ICD-10-CM | POA: Insufficient documentation

## 2012-10-28 LAB — CBC WITH DIFFERENTIAL/PLATELET
Eosinophils Absolute: 0 10*3/uL (ref 0.0–0.7)
Lymphs Abs: 0.7 10*3/uL (ref 0.7–4.0)
MCH: 28.7 pg (ref 26.0–34.0)
Neutro Abs: 4.9 10*3/uL (ref 1.7–7.7)
Neutrophils Relative %: 83 % — ABNORMAL HIGH (ref 43–77)
Platelets: 203 10*3/uL (ref 150–400)
RBC: 3.62 MIL/uL — ABNORMAL LOW (ref 3.87–5.11)
WBC: 5.9 10*3/uL (ref 4.0–10.5)

## 2012-10-28 LAB — URINALYSIS, ROUTINE W REFLEX MICROSCOPIC
Bilirubin Urine: NEGATIVE
Glucose, UA: NEGATIVE mg/dL
Ketones, ur: NEGATIVE mg/dL
pH: 6 (ref 5.0–8.0)

## 2012-10-28 LAB — COMPREHENSIVE METABOLIC PANEL
ALT: 15 U/L (ref 0–35)
AST: 24 U/L (ref 0–37)
Albumin: 3.7 g/dL (ref 3.5–5.2)
Alkaline Phosphatase: 79 U/L (ref 39–117)
CO2: 23 mEq/L (ref 19–32)
Calcium: 9.5 mg/dL (ref 8.4–10.5)
Chloride: 105 mEq/L (ref 96–112)
Creatinine, Ser: 1.37 mg/dL — ABNORMAL HIGH (ref 0.50–1.10)
GFR calc Af Amer: 42 mL/min — ABNORMAL LOW (ref >90)
GFR calc Af Amer: 39 mL/min — ABNORMAL LOW (ref >90)
GFR calc non Af Amer: 36 mL/min — ABNORMAL LOW (ref >90)
Glucose, Bld: 157 mg/dL — ABNORMAL HIGH (ref 70–99)
Potassium: 3.3 mEq/L — ABNORMAL LOW (ref 3.5–5.1)
Sodium: 142 mEq/L (ref 135–145)
Total Protein: 7.6 g/dL (ref 6.0–8.3)
Total Protein: 6.8 g/dL (ref 6.0–8.3)

## 2012-10-28 LAB — POCT I-STAT TROPONIN I: Troponin i, poc: 0.09 ng/mL (ref 0.00–0.08)

## 2012-10-28 LAB — CBC
Platelets: 214 10*3/uL (ref 150–400)
RBC: 3.91 MIL/uL (ref 3.87–5.11)
RDW: 15.8 % — ABNORMAL HIGH (ref 11.5–15.5)
WBC: 6.3 10*3/uL (ref 4.0–10.5)

## 2012-10-28 LAB — POCT PREGNANCY, URINE: Preg Test, Ur: NEGATIVE

## 2012-10-28 LAB — DIGOXIN LEVEL: Digoxin Level: 0.6 ng/mL — ABNORMAL LOW (ref 0.8–2.0)

## 2012-10-28 MED ORDER — DOXYCYCLINE HYCLATE 100 MG PO TABS: 100.0000 mg | ORAL_TABLET | Freq: Two times a day (BID) | ORAL | Status: DC

## 2012-10-28 MED ORDER — ALBUTEROL SULFATE (5 MG/ML) 0.5% IN NEBU
5.0000 mg | INHALATION_SOLUTION | Freq: Once | RESPIRATORY_TRACT | Status: AC
  Administered 2012-10-28: 5 mg via RESPIRATORY_TRACT
  Filled 2012-10-28: qty 1

## 2012-10-28 MED ORDER — LOPERAMIDE HCL 2 MG PO CAPS
4.0000 mg | ORAL_CAPSULE | ORAL | Status: DC | PRN
  Administered 2012-10-28: 4 mg via ORAL
  Filled 2012-10-28: qty 2

## 2012-10-28 MED ORDER — POTASSIUM CHLORIDE CRYS ER 20 MEQ PO TBCR
20.0000 meq | EXTENDED_RELEASE_TABLET | Freq: Once | ORAL | Status: AC
  Administered 2012-10-28: 20 meq via ORAL
  Filled 2012-10-28: qty 1

## 2012-10-28 MED ORDER — PREDNISONE 20 MG PO TABS: 40.0000 mg | ORAL_TABLET | Freq: Every day | ORAL | Status: AC

## 2012-10-28 MED ORDER — IPRATROPIUM BROMIDE 0.02 % IN SOLN
0.5000 mg | Freq: Once | RESPIRATORY_TRACT | Status: AC
  Administered 2012-10-28: 0.5 mg via RESPIRATORY_TRACT
  Filled 2012-10-28: qty 2.5

## 2012-10-28 NOTE — Patient Instructions (Addendum)
Labs today  Chest X-ray today  Prednisone 40 mg (2 tabs) daily for 5 days Doxycycline 100 mg Twice daily for 7 days  Your physician recommends that you schedule a follow-up appointment in: Monday

## 2012-10-28 NOTE — Progress Notes (Signed)
Patient ID: Kelly Hart, female   DOB: July 28, 1932, 79 y.o.   MRN: [id number] Dr. Olena Leatherwood - PCP   HPI:  Kelly Hart is a 79 year old AA female with of pulseless electrical activity arrest, COPD, right heart failure secondary to cor pulmonale,  pulmonary arterial hypertension (multifactorial but COPD plays a role), chronic respiratory failure, multifocal atrial tachycardia, peripheral arterial disease status post aortobifem bypass with acute occlusion of the right limb of the graft s/p thrombectomy by Dr. Arbie Cookey.    02/08/2011 Cardiac catheterization showed minimal coronary artery disease with 30% in the right coronary artery and 40%in the distal PDA. However, RA pressure mean 16, PA pressure 61/30.  PCWP in the right lower lobe pulmonary artery was 30.  LV pressure was 101/9 with an EDP of 13.  There was significant PCWP - LVEDP gradient concerning for mitral stenosis.  Her Fick cardiac output was 2.0 and cardiac index was 1.4, this was on low-dose dopamine.  Her PA saturation at that time was 39%.   05/10/11: Echo LV 50-55%.  Grade 1 diastolic dysfunction. Ventricular septum: Septal motion showed "bounce", contour showed diastolic flattening and systolic flattening.  RV moderately dilated with mildly reduced.  RA mildly dilated.  Severe TR.  Pulmonary arteries with systolic pressure mod increased.   Admitted to [facility] 01/07/12  for increased dyspnea. D/C 01/10/12 discharge weight 99 pounds. Adcirca increased to 20 mg daily and she was instructed to take lasix 20 mg twice a week.  She was sent home on milrinone. RHC 01/09/12 on milrinone  RA = 4  RV = 56/1/5  PA = 66/24 (45)  LPCW = 13  RPCW = 11  Fick cardiac output/index = 5.6/3.8  PVR = 8.0 [place]  FA sat = 90%  PA sat = 69%, 70%  SVC sat = 69%   Echo 01/08/12: LVEF 60-70%.  Ventricular septum showed septal bounce.  RV mod-severely dilated.  Atrial septum bowed right to left.  PASP 82 mmHG.    Echo (2/14): EF 55-60%, RV dilated with mild to moderate  systolic dysfunction, D-shaped septum,  PA systolic pressure 83 mmHg, severe TR.   Continues to live at [facility] in [place]. Followed by [facility] for PICC and Milrinone.   She returns for follow up today. She continues to use O2 5L at rest and 6L with activity. Weight is down 4 lbs.  Oxygen saturation at rest in the office today was 88-89% on 5L, which is her baseline.  She has been doing poorly for about 10 days.  She has been having episodes of watery diarrhea, up to 3 times a day, and nausea.  She has had 3 episodes of diarrhea already today.  No blood noted in stool.  No abdominal pain.  No fever.  She also has noted increased exertional dyspnea for about 10 days.  She is short of breath just walking around her house though she is comfortable at rest.  She has been producing more sputum for the last week (green-tinged at times) but no wheezing.   Her HR is elevated at 114, sinus tachycardia.  This is consistent with prior visits.    ECG: sinus tachycardia at 107, LAFB, nonspecific T wave changes  ROS: All systems negative except as listed in HPI, PMH and Problem List.  Past Medical History  Diagnosis Date  . Hypertension   . Renal insufficiency     baseline around cr 1.3  . Atrial fibrillation   . COPD (chronic  obstructive pulmonary disease)   . CHF (congestive heart failure)     diastolic  . PEA (Pulseless electrical activity) Oct 2012  . PAD (peripheral artery disease)   . Hyperlipidemia   . Hypothyroidism   . History of cervical cancer   . Chronic diarrhea     secondary to treatment of her cervical cancer  . Hydronephrosis, left   . Pulmonary hypertension   . GERD (gastroesophageal reflux disease)   . Myocardial infarction     No current facility-administered medications for this encounter.   Current Outpatient Prescriptions  Medication Sig Dispense Refill  . albuterol (PROVENTIL) (2.5 MG/3ML) 0.083% nebulizer solution Take 2.5 mg by  nebulization every 6 (six) hours as needed for wheezing or shortness of breath.       Marland Kitchen amiodarone (PACERONE) 200 MG tablet Take 0.5 tablets (100 mg total) by mouth daily.  30 tablet  6  . amLODipine (NORVASC) 10 MG tablet Take 5 mg by mouth daily.      Marland Kitchen aspirin EC 81 MG tablet Take 81 mg by mouth daily.        Marland Kitchen atorvastatin (LIPITOR) 40 MG tablet Take 40 mg by mouth at bedtime.       . budesonide-formoterol (SYMBICORT) 160-4.5 MCG/ACT inhaler Inhale 2 puffs into the lungs 2 (two) times daily.  1 Inhaler  6  . digoxin (LANOXIN) 0.125 MG tablet Take 0.5 tablets (62.5 mcg total) by mouth daily.  15 tablet  6  . diphenhydramine-acetaminophen (TYLENOL PM) 25-500 MG TABS Take 1 tablet by mouth at bedtime as needed (for sleep).       . furosemide (LASIX) 20 MG tablet Take 20 mg by mouth every Monday, Wednesday, and Friday.       . levothyroxine (SYNTHROID, LEVOTHROID) 125 MCG tablet Take 125 mcg by mouth daily before breakfast.      . LORazepam (ATIVAN) 1 MG tablet Take 0.5 tablets (0.5 mg total) by mouth 2 (two) times daily as needed for anxiety (or dizziness).  15 tablet  0  . magnesium oxide (MAG-OX) 400 MG tablet Take 200 mg by mouth daily.      . Milrinone in Dextrose (MILRINONE-DEXTROSE IV) Inject 0.25 mcg/kg/min into the vein. Concentration: 1 mg/mL (140 mL bag)      . Multiple Vitamins-Minerals (MULTIVITAMINS THER. W/MINERALS) TABS Take 1 tablet by mouth daily.        . pantoprazole (PROTONIX) 40 MG tablet Take 40 mg by mouth daily.      . potassium chloride SA (K-DUR,KLOR-CON) 20 MEQ tablet Take 20 mEq by mouth every Monday, Wednesday, and Friday.      . Tadalafil, PAH, 20 MG TABS Take 1 tablet (20 mg total) by mouth daily.  30 tablet  3  . Acetylcysteine (N-ACETYL-L-CYSTEINE PO) Take 1,000 mg by mouth daily.      Marland Kitchen doxycycline (VIBRA-TABS) 100 MG tablet Take 1 tablet (100 mg total) by mouth 2 (two) times daily.  14 tablet  0  . levothyroxine (SYNTHROID, LEVOTHROID) 100 MCG tablet Take 100  mcg by mouth daily before breakfast.      . meclizine (ANTIVERT) 50 MG tablet Take 0.5 tablets (25 mg total) by mouth 3 (three) times daily as needed for dizziness.  20 tablet  0  . predniSONE (DELTASONE) 20 MG tablet Take 2 tablets (40 mg total) by mouth daily.  10 tablet  0  . [DISCONTINUED] zolpidem (AMBIEN) 5 MG tablet Take 1 tablet (5 mg total) by mouth at bedtime as needed  for sleep.  30 tablet  1   Facility-Administered Medications Ordered in Other Encounters  Medication Dose Route Frequency Provider Last Rate Last Dose  . loperamide (IMODIUM) capsule 4 mg  4 mg Oral PRN Flint Melter, MD   4 mg at 10/28/12 1856     PHYSICAL EXAM: Filed Vitals:   10/28/12 1133  BP: 110/70  Pulse: 114  Weight: 106 lb 12.8 oz (48.444 kg)  SpO2: 89%   General: Thin. No resp difficulty wearing O2, on 5 liters [place]. HEENT: normal Neck: supple. JVP flat.   Carotids 2+ bilaterally; no bruits. No lymphadenopathy or thryomegaly appreciated. Cor: PMI normal.Tachycardic, regular rhythm. 2/6 HSM LLSB. +prominent s2. +RV lift Lungs: Decreased breath sounds throughout, occasional rhonchi, no wheezes Abdomen: soft, nontender, nondistended. No hepatosplenomegaly. No bruits or masses. Good bowel sounds. Extremities: no cyanosis, clubbing, rash, edema. RUE PICC Neuro: alert & orientedx3, cranial nerves grossly intact. Moves all 4 extremities w/o difficulty. Affect pleasant.   ASSESSMENT & PLAN: 1. Diarrhea/nausea: Patient has been having symptoms consistent with viral gastroenteritis.  No blood in stool, no fever.  Abdomen is not tender.  She is no hypotensive and though HR is elevated, this is chronic for her.  No recent antibiotics.  I think that the best course here is going to be be symptomatic treatment.  We can try to get a stool sample for culture and c difficile.  I will check BMET to make sure she is not hypokalemic and has not developed AKI.  Will also get LFTs.   2. Increased dyspnea: JVP is not  elevated and weight is down.  She has been taking her Lasix three times a week and also has been having diarrhea.  I do not think that this represent volume overload.  She does have significant COPD, followed by Dr. Delton Coombes.  She has some rhonchi on lung exam today and reports increased sputum.  I will treat her for COPD exacerbation with a short course of prednisone (40 mg daily x 5 days) and a 7 day course of doxycycline.  I will get a CXR to rule out PNA and a CBC to look at WBCs. Her oxygen level at rest, 89% on 5 L [place], seems to be near her baseline.  3. Pulmonary arterial hypertension with cor pulmonale: Multifactorial PAH, due in part to COPD.  She is currently on tadalafil and milrinone.  JVP is not significantly elevated and weight is down.  She does not seem to be more volume overloaded than her baseline today.  4. Paroxysmal atrial fibrillation/MAT: NSR today.  She is not on anticoagulation.  She is on amiodarone. Will check TSH and LFTs today.   5. PAD: s/p aortobifemoral bypass.  She is on ASA 81 and statin.   She is going to need close followup.  I will see her back on Monday.   Marca Ancona 10/28/2012

## 2012-10-28 NOTE — ED Notes (Addendum)
[facility] for [facility] appointment. Pt given PO abx rx for prophylaxis. Pt's daughter states pt has been weak for approx 2 weeks. Today, pt was on the way home from appointment and was incontinent of bowel. Denies fever. Denies abdominal pain. Pt's oxygen requirement has increased with activity. Normally wears 5 lpm, needs 6 lpm with activity. Pt is "wiped out" with simply walking to the bathroom.

## 2012-10-28 NOTE — ED Provider Notes (Signed)
History    CSN: [id number] Arrival date & time 10/28/12  1407  First MD Initiated Contact with Patient 10/28/12 1504     Chief Complaint  Patient presents with  . Emesis  . Diarrhea   (Consider location/radiation/quality/duration/timing/severity/associated sxs/prior Treatment) HPI Comments: Kelly Hart is a 79 y.o. female who was on her way home from a visit to the CHF clinic, when she suddenly developed vomiting, and diarrhea. She had similar episode several days ago. Today, in the clinic. She was evaluated for CHF and found to be at her stable baseline. No additional cardiac interventions were undertaken. Her physician prescribed doxycycline and prednisone, and discharge. Which began vomiting. The family decided to come back to the hospital and brought her to the ED, for evaluation. She's not had any preceding fever, change in cough, or significant chest pain. There's been no syncope, headache, back, pain or paresthesias. There no known sick contacts. There are no known modifying factors  The history is provided by the patient and a relative.   Past Medical History  Diagnosis Date  . Hypertension   . Renal insufficiency     baseline around cr 1.3  . Atrial fibrillation   . COPD (chronic obstructive pulmonary disease)   . CHF (congestive heart failure)     diastolic  . PEA (Pulseless electrical activity) Oct 2012  . PAD (peripheral artery disease)   . Hyperlipidemia   . Hypothyroidism   . History of cervical cancer   . Chronic diarrhea     secondary to treatment of her cervical cancer  . Hydronephrosis, left   . Pulmonary hypertension   . GERD (gastroesophageal reflux disease)   . Myocardial infarction    Past Surgical History  Procedure Laterality Date  . Aortobifem bypass grafting    . Open thrombectomy  Oct 2012  . Pr vein bypass graft,aorto-fem-pop  02/04/2009  . Peripherally inserted central catheter insertion     Family History  Problem Relation Age of Onset   . Heart failure Brother   . Heart disease Father   . Throat cancer Sister   . Emphysema Father    History  Substance Use Topics  . Smoking status: Former Smoker -- 0.50 packs/day for 50 years    Types: Cigarettes    Quit date: 01/15/2011  . Smokeless tobacco: Never Used  . Alcohol Use: No   OB History   Grav Para Term Preterm Abortions TAB SAB Ect Mult Living                 Review of Systems  All other systems reviewed and are negative.    Allergies  Iodine; Lisinopril; Other; and Penicillins  Home Medications   Current Outpatient Rx  Name  Route  Sig  Dispense  Refill  . Acetylcysteine (N-ACETYL-L-CYSTEINE PO)   Oral   Take 1,000 mg by mouth daily.         Marland Kitchen albuterol (PROVENTIL) (2.5 MG/3ML) 0.083% nebulizer solution   Nebulization   Take 2.5 mg by nebulization every 6 (six) hours as needed for wheezing or shortness of breath.          Marland Kitchen amiodarone (PACERONE) 200 MG tablet   Oral   Take 0.5 tablets (100 mg total) by mouth daily.   30 tablet   6   . amLODipine (NORVASC) 10 MG tablet   Oral   Take 5 mg by mouth daily.         Marland Kitchen aspirin EC 81 MG  tablet   Oral   Take 81 mg by mouth daily.           Marland Kitchen atorvastatin (LIPITOR) 40 MG tablet   Oral   Take 40 mg by mouth at bedtime.          . budesonide-formoterol (SYMBICORT) 160-4.5 MCG/ACT inhaler   Inhalation   Inhale 2 puffs into the lungs 2 (two) times daily.   1 Inhaler   6   . digoxin (LANOXIN) 0.125 MG tablet   Oral   Take 0.5 tablets (62.5 mcg total) by mouth daily.   15 tablet   6   . diphenhydramine-acetaminophen (TYLENOL PM) 25-500 MG TABS   Oral   Take 1 tablet by mouth at bedtime as needed (for sleep).          Marland Kitchen doxycycline (VIBRA-TABS) 100 MG tablet   Oral   Take 1 tablet (100 mg total) by mouth 2 (two) times daily.   14 tablet   0   . furosemide (LASIX) 20 MG tablet   Oral   Take 20 mg by mouth every Monday, Wednesday, and Friday.          . levothyroxine  (SYNTHROID, LEVOTHROID) 100 MCG tablet   Oral   Take 100 mcg by mouth daily before breakfast.         . levothyroxine (SYNTHROID, LEVOTHROID) 125 MCG tablet   Oral   Take 125 mcg by mouth daily before breakfast.         . LORazepam (ATIVAN) 1 MG tablet   Oral   Take 0.5 tablets (0.5 mg total) by mouth 2 (two) times daily as needed for anxiety (or dizziness).   15 tablet   0   . magnesium oxide (MAG-OX) 400 MG tablet   Oral   Take 200 mg by mouth daily.         . meclizine (ANTIVERT) 50 MG tablet   Oral   Take 0.5 tablets (25 mg total) by mouth 3 (three) times daily as needed for dizziness.   20 tablet   0   . Milrinone in Dextrose (MILRINONE-DEXTROSE IV)   Intravenous   Inject 0.25 mcg/kg/min into the vein. Concentration: 1 mg/mL (140 mL bag)         . Multiple Vitamins-Minerals (MULTIVITAMINS THER. W/MINERALS) TABS   Oral   Take 1 tablet by mouth daily.           . pantoprazole (PROTONIX) 40 MG tablet   Oral   Take 40 mg by mouth daily.         . potassium chloride SA (K-DUR,KLOR-CON) 20 MEQ tablet   Oral   Take 20 mEq by mouth every Monday, Wednesday, and Friday.         . predniSONE (DELTASONE) 20 MG tablet   Oral   Take 2 tablets (40 mg total) by mouth daily.   10 tablet   0   . Tadalafil, PAH, 20 MG TABS   Oral   Take 1 tablet (20 mg total) by mouth daily.   30 tablet   3    BP 125/70  Pulse 108  Temp(Src) 97.6 F (36.4 C) (Oral)  Resp 21  SpO2 91% Physical Exam  Nursing note and vitals reviewed. Constitutional: She is oriented to person, place, and time. She appears well-developed.  Elderly slender, frail  HENT:  Head: Normocephalic and atraumatic.  Eyes: Conjunctivae and EOM are normal. Pupils are equal, round, and reactive to light.  Neck: Normal  range of motion and phonation normal. Neck supple.  Cardiovascular: Normal rate, regular rhythm and intact distal pulses.   Pulmonary/Chest: Effort normal and breath sounds normal. No  respiratory distress. She has no wheezes. She exhibits no tenderness.  Abdominal: Soft. She exhibits no distension. There is no tenderness. There is no guarding.  Musculoskeletal: Normal range of motion. She exhibits no edema.  Neurological: She is alert and oriented to person, place, and time. She has normal strength. She exhibits normal muscle tone.  Skin: Skin is warm and dry.  Psychiatric: She has a normal mood and affect. Her behavior is normal. Judgment and thought content normal.    ED Course  Procedures (including critical care time) Medications  loperamide (IMODIUM) capsule 4 mg (4 mg Oral Given 10/28/12 1856)  potassium chloride SA (K-DUR,KLOR-CON) CR tablet 20 mEq (20 mEq Oral Given 10/28/12 1855)  albuterol (PROVENTIL) (5 MG/ML) 0.5% nebulizer solution 5 mg (5 mg Nebulization Given 10/28/12 1704)  ipratropium (ATROVENT) nebulizer solution 0.5 mg (0.5 mg Nebulization Given 10/28/12 1704)    17:40- case discussed with Dr. Shirlee Latch, who saw her in the CHF clinic, today. He felt that she had a COPD exacerbation and prescribed, prednisone, and doxycycline. He, states are her baseline O2 sats around 80% on 5-6 L of nasal cannula oxygen. She has end-stage congestive heart failure, complicated by COPD. It was his plan to treat the diarrhea symptomatically. I communicated this discussion to the patient and her family who understand, and agree with this plan. She has not produced any stool, since arriving in ED, and is hungry and thirsty. Her usual daily medications were given. She typically takes him about 1 PM, but did not today, because she was in the clinic, then came here.       Labs Reviewed  CBC WITH DIFFERENTIAL - Abnormal; Notable for the following:    RBC 3.62 (*)    Hemoglobin 10.4 (*)    HCT 31.5 (*)    RDW 15.7 (*)    Neutrophils Relative % 83 (*)    All other components within normal limits  COMPREHENSIVE METABOLIC PANEL - Abnormal; Notable for the following:    Potassium  3.3 (*)    Glucose, Bld 157 (*)    Creatinine, Ser 1.46 (*)    GFR calc non Af Amer 33 (*)    GFR calc Af Amer 39 (*)    All other components within normal limits  PRO B NATRIURETIC PEPTIDE - Abnormal; Notable for the following:    Pro B Natriuretic peptide (BNP) 3408.0 (*)    All other components within normal limits  POCT I-STAT TROPONIN I - Abnormal; Notable for the following:    Troponin i, poc 0.10 (*)    All other components within normal limits  STOOL CULTURE  OVA AND PARASITE EXAMINATION  URINALYSIS, ROUTINE W REFLEX MICROSCOPIC  POCT PREGNANCY, URINE   Dg Chest 2 View  10/28/2012   *RADIOLOGY REPORT*  Clinical Data: 79 year old female with shortness of breath. Melanoma.  CHEST - 2 VIEW  Comparison: 01/07/2012 and earlier.  Findings: Right PICC line in place.  Stable lung volumes. Stable cardiomegaly and mediastinal contours.  Diffuse increased interstitial markings, reticulonodular, but stable since 2013. Evidence of chronic lower lobe bronchiectasis.  No pneumothorax. No pleural effusion or confluent pulmonary opacity.  Calcified atherosclerosis of the aorta.  Osteopenia. No acute osseous abnormality identified.  IMPRESSION: Stable chest. No definite acute or metastatic cardiopulmonary abnormality.   Original Report Authenticated By: Odessa Fleming  III, [name]   1. Gastroenteritis   2. Congestive heart failure, chronic, combined   3. COPD (chronic obstructive pulmonary disease)     MDM  Nonspecific, vomiting, and diarrhea, improved in ED. She was able to tolerate oral liquids and food. She was treated with Imodium. She did not produce a stool sample for testing. Her cardiovascular status was felt to be normal. When she was seen by her cardiology service earlier in this afternoon. Troponin X 2 unchanged.  Doubt ACS, PE, or pneumonia. The diarrhea is nonspecific, and intermittent. She'll need further workup for it and this can be performed as an outpatient. She has a PCP back home where she  lives in [place].  Family plans on taking her there, tonight.   Nursing Notes Reviewed/ Care Coordinated, and agree without changes. Applicable Imaging Reviewed.  Interpretation of Laboratory Data incorporated into ED treatment    Plan: Home Medications- Immodium; Home Treatments and Observation- rest, oral fluids; return here if the recommended treatment, does not improve the symptoms; Recommended follow up- PCP for check up in 2 days    Flint Melter, MD 10/28/12 2049

## 2012-10-28 NOTE — ED Notes (Signed)
Per family pt has had diarrhea and vomiting for 2 weeks but worse today. Pt has been incontinent of stool.

## 2012-10-28 NOTE — ED Notes (Signed)
i-stat troponin result given to Dr.Wentz 0.09 ng/ml

## 2012-10-28 NOTE — ED Notes (Signed)
MD at bedside. Dr. Wentz. 

## 2012-10-28 NOTE — ED Notes (Signed)
Pt given a turkey sandwich and coke to drink 

## 2012-10-29 ENCOUNTER — Telehealth: Payer: Self-pay | Admitting: Emergency Medicine

## 2012-10-29 ENCOUNTER — Telehealth (HOSPITAL_COMMUNITY): Payer: Self-pay | Admitting: *Deleted

## 2012-10-29 NOTE — Telephone Encounter (Signed)
Pt.'s daughter [name] called and reported that after leaving office visit, pt was seen at the ED for nausea and diarrhea, she was given imodium. Pt.'s daughter states that the pt is really weak. Pt is resting today, instructed the daughter to give [place] a call back if pt starts to feel worst or if she need to be seen before her appt. On Monday November 03, 2012 at 11.15am.

## 2012-10-29 NOTE — Telephone Encounter (Deleted)
k

## 2012-10-29 NOTE — Telephone Encounter (Signed)
ATC NA WCB 

## 2012-10-30 NOTE — Telephone Encounter (Signed)
Spoke with daughter Kelly Hart) -- pt lives at [facility] that pt is having increased SOB with little exertion, Nausea/diarrhea, weakness x 2-3weeks.  Se[facility] in ED at [facility] 10/28/12 Pt weak since being out of hospital, trying to keep pt hydrated.  SOB has not returned as of yet since being out of hospital.   Pt to see Bensimhon on Monday 11/10/12  Dr Delton Coombes please advise if nay recs for pt at home or if you would like to see pt to f/u. Thanks.

## 2012-10-30 NOTE — Telephone Encounter (Signed)
Yes, she needs to be seen by me. Please put her in next 15 min slot

## 2012-10-30 NOTE — Telephone Encounter (Signed)
ATC, line busy, wcb. Carron Curie, CMA

## 2012-10-31 ENCOUNTER — Encounter: Payer: Self-pay | Admitting: Internal Medicine

## 2012-10-31 NOTE — Telephone Encounter (Signed)
i spoke with daughter. appt scheduled for 11/12/12 at 4:15. Nothing further was needed

## 2012-11-03 ENCOUNTER — Ambulatory Visit (HOSPITAL_COMMUNITY)
Admission: RE | Admit: 2012-11-03 | Discharge: 2012-11-03 | Disposition: A | Discharge status: Home / Self Care | Payer: Medicare Other | Source: Ambulatory Visit | Attending: Cardiology | Admitting: Cardiology

## 2012-11-03 VITALS — BP 120/74 | HR 108 | Wt 106.0 lb

## 2012-11-03 DIAGNOSIS — I272 Pulmonary hypertension, unspecified: Secondary | ICD-10-CM

## 2012-11-03 DIAGNOSIS — I4891 Unspecified atrial fibrillation: Secondary | ICD-10-CM | POA: Insufficient documentation

## 2012-11-03 DIAGNOSIS — I739 Peripheral vascular disease, unspecified: Secondary | ICD-10-CM | POA: Insufficient documentation

## 2012-11-03 DIAGNOSIS — I2789 Other specified pulmonary heart diseases: Secondary | ICD-10-CM | POA: Insufficient documentation

## 2012-11-03 DIAGNOSIS — R0600 Dyspnea, unspecified: Secondary | ICD-10-CM | POA: Insufficient documentation

## 2012-11-03 DIAGNOSIS — R197 Diarrhea, unspecified: Secondary | ICD-10-CM

## 2012-11-03 DIAGNOSIS — J449 Chronic obstructive pulmonary disease, unspecified: Secondary | ICD-10-CM

## 2012-11-03 DIAGNOSIS — J4489 Other specified chronic obstructive pulmonary disease: Secondary | ICD-10-CM | POA: Insufficient documentation

## 2012-11-03 DIAGNOSIS — R0989 Other specified symptoms and signs involving the circulatory and respiratory systems: Secondary | ICD-10-CM | POA: Insufficient documentation

## 2012-11-03 DIAGNOSIS — R0609 Other forms of dyspnea: Secondary | ICD-10-CM | POA: Insufficient documentation

## 2012-11-03 NOTE — Patient Instructions (Addendum)
Follow up in 3 weeks  Take lasix 20 mg Mon-Wed-Fri  Do the following things EVERYDAY: 1) Weigh yourself in the morning before breakfast. Write it down and keep it in a log. 2) Take your medicines as prescribed 3) Eat low salt foods-Limit salt (sodium) to 2000 mg per day.  4) Stay as active as you can everyday 5) Limit all fluids for the day to less than 2 liters

## 2012-11-03 NOTE — Progress Notes (Signed)
Patient ID: Kelly Hart, female   DOB: 09/26/1932, 79 y.o.   MRN: [id number] Dr. Olena Leatherwood - PCP   HPI:  Nehemiah is a 79 year old AA female with of pulseless electrical activity arrest, COPD, right heart failure secondary to cor pulmonale,  pulmonary arterial hypertension (multifactorial but COPD plays a role), chronic respiratory failure, multifocal atrial tachycardia, peripheral arterial disease status post aortobifem bypass with acute occlusion of the right limb of the graft s/p thrombectomy by Dr. Arbie Cookey.    02/08/2011 Cardiac catheterization showed minimal coronary artery disease with 30% in the right coronary artery and 40%in the distal PDA. However, RA pressure mean 16, PA pressure 61/30.  PCWP in the right lower lobe pulmonary artery was 30.  LV pressure was 101/9 with an EDP of 13.  There was significant PCWP - LVEDP gradient concerning for mitral stenosis.  Her Fick cardiac output was 2.0 and cardiac index was 1.4, this was on low-dose dopamine.  Her PA saturation at that time was 39%.   05/10/11: Echo LV 50-55%.  Grade 1 diastolic dysfunction. Ventricular septum: Septal motion showed "bounce", contour showed diastolic flattening and systolic flattening.  RV moderately dilated with mildly reduced.  RA mildly dilated.  Severe TR.  Pulmonary arteries with systolic pressure mod increased.   Admitted to [facility] 01/07/12  for increased dyspnea. D/C 01/10/12 discharge weight 99 pounds. Adcirca increased to 20 mg daily and she was instructed to take lasix 20 mg twice a week.  She was sent home on milrinone.  RHC 01/09/12 on milrinone  RA = 4  RV = 56/1/5  PA = 66/24 (45)  LPCW = 13  RPCW = 11  Fick cardiac output/index = 5.6/3.8  PVR = 8.0 [place]  FA sat = 90%  PA sat = 69%, 70%  SVC sat = 69%   Echo 01/08/12: LVEF 60-70%.  Ventricular septum showed septal bounce.  RV mod-severely dilated.  Atrial septum bowed right to left.  PASP 82 mmHG.    Echo (2/14): EF 55-60%, RV dilated with mild to moderate  systolic dysfunction, D-shaped septum,  PA systolic pressure 83 mmHg, severe TR.   She returns for follow up today. Last week she was evaluated in the HF clinic followed by an ED due to nausea and diarrhea as well as cough and increased dyspnea. She was placed on prednisone and doxycycline for possible COPD exacerbation. Lasix was also held for several days. Says she is feeling better but remains dyspneic with exertion. She denies PND/CP. Has had 2 episodes this month with presyncope. She continues to use O2 5L at rest and 6L with activity. She is essentially chair bound with exception of trips to the bathroom. O2 sats drop to 79% with exertion. Diarrhea has resolved. No blood noted in stool.  No abdominal pain.  Productive cough clear to green. Weight at home 106 pounds. Continues to live at [facility] in [place].  Followed by [facility] for PICC and Milrinone.  CXR last week showed no acute disease.   Labs (7/14): No UTI on UA, BNP 3408, K 3.3, creatinine 1.46, LFTs normal, digoxin 0.6, TSH normal, HCT 34.3  ROS: All systems negative except as listed in HPI, PMH and Problem List.  Past Medical History  Diagnosis Date  . Hypertension   . Renal insufficiency     baseline around cr 1.3  . Atrial fibrillation   . COPD (chronic obstructive pulmonary disease)   . CHF (congestive heart failure)  diastolic  . PEA (Pulseless electrical activity) Oct 2012  . PAD (peripheral artery disease)   . Hyperlipidemia   . Hypothyroidism   . History of cervical cancer   . Chronic diarrhea     secondary to treatment of her cervical cancer  . Hydronephrosis, left   . Pulmonary hypertension   . GERD (gastroesophageal reflux disease)   . Myocardial infarction     Current Outpatient Prescriptions  Medication Sig Dispense Refill  . Acetylcysteine (N-ACETYL-L-CYSTEINE PO) Take 1,000 mg by mouth daily.      Marland Kitchen albuterol (PROVENTIL) (2.5 MG/3ML) 0.083% nebulizer solution Take  2.5 mg by nebulization every 6 (six) hours as needed for wheezing or shortness of breath.       Marland Kitchen amiodarone (PACERONE) 200 MG tablet Take 0.5 tablets (100 mg total) by mouth daily.  30 tablet  6  . amLODipine (NORVASC) 10 MG tablet Take 5 mg by mouth daily.      Marland Kitchen aspirin EC 81 MG tablet Take 81 mg by mouth daily.        Marland Kitchen atorvastatin (LIPITOR) 40 MG tablet Take 40 mg by mouth at bedtime.       . budesonide-formoterol (SYMBICORT) 160-4.5 MCG/ACT inhaler Inhale 2 puffs into the lungs 2 (two) times daily.  1 Inhaler  6  . digoxin (LANOXIN) 0.125 MG tablet Take 0.5 tablets (62.5 mcg total) by mouth daily.  15 tablet  6  . doxycycline (VIBRA-TABS) 100 MG tablet Take 1 tablet (100 mg total) by mouth 2 (two) times daily.  14 tablet  0  . furosemide (LASIX) 20 MG tablet Take 20 mg by mouth every Monday, Wednesday, and Friday.       . levothyroxine (SYNTHROID, LEVOTHROID) 100 MCG tablet Take 100 mcg by mouth daily before breakfast.      . levothyroxine (SYNTHROID, LEVOTHROID) 125 MCG tablet Take 125 mcg by mouth daily before breakfast.      . LORazepam (ATIVAN) 1 MG tablet Take 0.5 tablets (0.5 mg total) by mouth 2 (two) times daily as needed for anxiety (or dizziness).  15 tablet  0  . magnesium oxide (MAG-OX) 400 MG tablet Take 200 mg by mouth daily.      . meclizine (ANTIVERT) 50 MG tablet Take 0.5 tablets (25 mg total) by mouth 3 (three) times daily as needed for dizziness.  20 tablet  0  . Milrinone in Dextrose (MILRINONE-DEXTROSE IV) Inject 0.25 mcg/kg/min into the vein. Concentration: 1 mg/mL (140 mL bag)      . Multiple Vitamins-Minerals (MULTIVITAMINS THER. W/MINERALS) TABS Take 1 tablet by mouth daily.        . pantoprazole (PROTONIX) 40 MG tablet Take 40 mg by mouth daily.      . potassium chloride SA (K-DUR,KLOR-CON) 20 MEQ tablet Take 20 mEq by mouth every Monday, Wednesday, and Friday.      . Tadalafil, PAH, 20 MG TABS Take 1 tablet (20 mg total) by mouth daily.  30 tablet  3  .  diphenhydramine-acetaminophen (TYLENOL PM) 25-500 MG TABS Take 1 tablet by mouth at bedtime as needed (for sleep).       . [DISCONTINUED] zolpidem (AMBIEN) 5 MG tablet Take 1 tablet (5 mg total) by mouth at bedtime as needed for sleep.  30 tablet  1   No current facility-administered medications for this encounter.     PHYSICAL EXAM: Filed Vitals:   11/03/12 1125  BP: 120/74  Pulse: 108  Weight: 106 lb (48.081 kg)  SpO2: 87%  General: Thin. No resp difficulty wearing O2, on 5 liters [place]. HEENT: normal Neck: supple. JVP flat.   Carotids 2+ bilaterally; no bruits. No lymphadenopathy or thryomegaly appreciated. Cor: PMI normal.Tachycardic, regular rhythm. 2/6 HSM LLSB. +prominent s2. +RV lift Lungs: Decreased breath sounds throughout, occasional rhonchi, no wheezes Abdomen: soft, nontender, nondistended. No hepatosplenomegaly. No bruits or masses. Good bowel sounds. Extremities: no cyanosis, clubbing, rash, edema. RUE PICC Neuro: alert & orientedx3, cranial nerves grossly intact. Moves all 4 extremities w/o difficulty. Affect pleasant.   ASSESSMENT & PLAN: 1. Diarrhea/nausea: Resolved.   2. Dyspnea at baseline. JVP is not elevated, weight is unchanged.  Restart Lasix 3 times a week. Continue oxygen at 5 liters at rest and 6 liters with exertion. Continue doxycycline total for 7 days (treatment for possible COPD exacerbation).  She has completed prednisone course.   3.  Pulmonary arterial hypertension with cor pulmonale: Multifactorial PAH, due in part to COPD.  She is currently on tadalafil and milrinone (milrinone likely primarily treating RV failure and elevated PA pressure).  She has not tolerated up- titration of tadalafil in the past due to hypotension. JVP is not significantly elevated and weight is stable.  Continue HH for home Milrinone. Follow up with Dr Delton Coombes next week. Discussed Hospice option however she declines.    4. Paroxysmal atrial fibrillation/MAT: NSR today.  She is  not on anticoagulation.  She is on amiodarone with recent TSH and LFTs normal.    5. PAD: s/p aortobifemoral bypass.  She is on ASA 81 and statin.   CLEGG,AMY 11/03/2012  Patient seen with NP, agree with the above note.  Improved from last week but still dyspneic with mild exertion (NYHA class IV).  Has not tolerated uptitration of tadalafil.  She now has end-stage heart and lung disease.  We talked about hospice today.  She is going to think on it.   Marca Ancona 11/03/2012

## 2012-11-06 ENCOUNTER — Encounter: Payer: Self-pay | Admitting: Internal Medicine

## 2012-11-12 ENCOUNTER — Ambulatory Visit (INDEPENDENT_AMBULATORY_CARE_PROVIDER_SITE_OTHER): Payer: Medicare Other | Admitting: Emergency Medicine

## 2012-11-12 ENCOUNTER — Encounter: Payer: Self-pay | Admitting: Emergency Medicine

## 2012-11-12 VITALS — BP 126/72 | HR 115 | Ht 64.0 in | Wt 107.6 lb

## 2012-11-12 DIAGNOSIS — I2789 Other specified pulmonary heart diseases: Secondary | ICD-10-CM

## 2012-11-12 DIAGNOSIS — I272 Pulmonary hypertension, unspecified: Secondary | ICD-10-CM

## 2012-11-12 NOTE — Progress Notes (Signed)
Subjective:     Patient ID: Kelly Hart, female   DOB: [birth year], 79 y.o.   MRN: [id number] HPI 79 yo woman, hx HTN and diastolic CHF, CRI, A Fib/MAT, hypothyroidism. Also hx tobacco use (30 pk-yrs) and possible COPD. Secondary PAH with cor pulmonale on milrinone infusion and started on tadalifil in 04/2011 at hospitalization. She is on chronic O2 at 2L/min started in 11/12. She is experiencing exertional SOB, slowly progressive.   ROV 09/28/11 -- hx HTN and diastolic CHF, CRI, A Fib/MAT, hypothyroidism. Also hx tobacco use (30 pk-yrs) and possible COPD. On tadalifil 10mg  for secondary PAH. Returns for PFT today - mixed obstruction and restriction, severe diffusion defect, no BD response.  Wearing O2 at 2L/min, forgets to increase to 3L/min with exertion.   ROV 11/30/11 -- hx HTN and diastolic CHF, CRI, A Fib/MAT, hypothyroidism. Also hx tobacco use (30 pk-yrs) and possible COPD. On tadalifil 10mg  for secondary PAH. We started spiriva in June for mixed disease. She felt that she was more SOB on the Spiriva, difficulty walking thru the house. She stopped the spiriva 2 weeks ago and feels that she is almost back to baseline. Her O2 compliance is questionable.    ROV 01/04/12 -- hx HTN and diastolic CHF, CRI, A Fib/MAT followed by Dr Gala Romney, hypothyroidism. Also hx tobacco use (30 pk-yrs) and possible COPD (mixed disease with DLCO defect). Remains on tadalifil 10mg   for her secondary PAH. Did not benefit from Spiriva, last time we did trial Symbicort to see if she would benefit. She isn't sure whether it helped her. She is severely dyspneic with exertion. Hypoxic today with walking, reports hypoxemia at rest at home on the RA.    ROV 02/27/12 -- hx HTN and diastolic CHF, CRI, A Fib/MAT followed by Dr Gala Romney, hypothyroidism. Also hx tobacco use (30 pk-yrs) and possible COPD (mixed disease with DLCO defect). We have tried spiriva and symbicort >> she was never sure that she benefited. Continues to desat  with exertion. She doesn';t always increase O2 with walking when at home. Since last time Dr Gala Romney increased tadalafil to 20mg  qd >> no change  ROV 04/30/12 -- multifactorial secondary PAH, probable COPD (not on BD's). Managed on tadalafil. Last time we decided to retry Symbicort to see if she would benefit - hasn't noticed any benefit. Also increased her O2 to 4L/min at rest, 5L/min exertion. She never got TTE wth Dr Gala Romney. She was prescribed an oximizer and increase to 6L/min w exertion but doesn't like to use it due to fit and appearance. She continues to desaturate with any activity.   ROV 07/08/12 -- multifactorial secondary PAH, probable COPD, cardiomyopathy on milrinone infusion. Managed on tadalafil 20mg . Has not benefited from BD's but willing to continue symbicort. TTE done 05/23/12 >> no change PASP ( ) from 9/13. She feels that breathing is good at rest, is very limited w exertion. She is doing pulm rehab in [place] at [facility]. She has an oximizer, uses on 5L > but rarely wears it due to the wt on her ears.  ROV 09/10/12 -- multifactorial secondary PAH, probable COPD (mixed disease on spiro), cardiomyopathy on milrinone infusion, tadalafil 20mg . Latest o2 orders are for 5L/min via oximizer.  Was seen in ED 5/18 for ? Vertigo > experienced imbalance, no actual falls x 3 days. Improved w antivert, but does raise the question of hypoxemia, hypotension, etc. She admits to not wearing her oximizer. Denies dyspnea but she knows that she drops to the 70's w  exertion.   ROV 7/301/4 -- multifactorial secondary PAH, probable COPD (mixed disease on spiro), cardiomyopathy on milrinone infusion, tadalafil 20mg . On 5L/min per oximizer although today she is on pulsed and desats to the 50's. No flares. PASP was 83 in February. She is interested in possibly going up on tadalafil.       Objective:   Physical Exam Filed Vitals:   11/12/12 1616  BP: 126/72  Pulse: 115   Gen: Pleasant, thin, in no  distress,  normal affect on O2  ENT: No lesions,  mouth clear,  oropharynx clear, no postnasal drip  Neck: No JVD, no TMG, no carotid bruits  Lungs: No use of accessory muscles, no dullness to percussion, clear without rales or rhonchi  Cardiovascular: irregular, heart sounds normal, no murmur or gallops, no peripheral edema, milrinone infusion in place  Musculoskeletal: No deformities, no cyanosis or clubbing  Neuro: alert, non focal  Skin: Warm, no lesions or rashes  BMET    Component Value Date/Time   NA 142 10/28/2012 1417   K 3.3* 10/28/2012 1417   CL 105 10/28/2012 1417   CO2 22 10/28/2012 1417   GLUCOSE 157* 10/28/2012 1417   BUN 14 10/28/2012 1417   CREATININE 1.46* 10/28/2012 1417   CALCIUM 9.1 10/28/2012 1417   GFRNONAA 33* 10/28/2012 1417   GFRAA 39* 10/28/2012 1417   CBC    Component Value Date/Time   WBC 5.9 10/28/2012 1417   RBC 3.62* 10/28/2012 1417   HGB 10.4* 10/28/2012 1417   HCT 31.5* 10/28/2012 1417   PLT 203 10/28/2012 1417   MCV 87.0 10/28/2012 1417   MCH 28.7 10/28/2012 1417   MCHC 33.0 10/28/2012 1417   RDW 15.7* 10/28/2012 1417   LYMPHSABS 0.7 10/28/2012 1417   MONOABS 0.3 10/28/2012 1417   EOSABS 0.0 10/28/2012 1417   BASOSABS 0.0 10/28/2012 1417    05/10/11 --  Study Conclusions - Left ventricle: The cavity size was normal. Wall thickness was increased in a pattern of mild LVH. Systolic function was normal. The estimated ejection fraction was in the range of 50% to 55%. Doppler parameters are consistent with abnormal left ventricular relaxation (grade 1 diastolic dysfunction). - Ventricular septum: Septal motion showed "bounce". The contour showed diastolic flattening and systolic flattening. - Right ventricle: The cavity size was moderately dilated. Systolic function was mildly reduced. - Right atrium: The atrium was mildly dilated. - Tricuspid valve: Severe regurgitation. - Pulmonary arteries: Systolic pressure was moderately Increased.  TTE  05/21/12 --  Study Conclusions - Left ventricle: The cavity size was normal. Wall thickness was increased in a pattern of mild LVH. Systolic function was normal. The estimated ejection fraction was in the range of 55% to 60%. Wall motion was normal; there were no regional wall motion abnormalities. The study is not technically sufficient to allow evaluation of LV diastolic function. - Ventricular septum: The contour showed diastolic flattening and systolic flattening. - Right ventricle: Systolic function was mildly to moderately reduced. - Atrial septum: There was an atrial septal aneurysm. - Tricuspid valve: Severe regurgitation. - Pulmonary arteries: Systolic pressure was severely increased. PA peak pressure: 83mm Hg (S). - Pericardium, extracardiac: A trivial pericardial effusion was identified.      Assessment:     Pulmonary hypertension Multifactorial PAH, on milrinone. Not clear to me that increase in tadalafil will help here, but she is interested in this. I will speak to Dr Gala Romney about a possible repeat TTE, possibly increasing therapy. Again, I suspect that changing  one med will lead to a cascade of changes (w her milrinone, etc).

## 2012-11-12 NOTE — Assessment & Plan Note (Signed)
Multifactorial PAH, on milrinone. Not clear to me that increase in tadalafil will help here, but she is interested in this. I will speak to Dr Gala Romney about a possible repeat TTE, possibly increasing therapy. Again, I suspect that changing one med will lead to a cascade of changes (w her milrinone, etc).

## 2012-11-12 NOTE — Patient Instructions (Addendum)
We will discuss possibly increasing your tadalafil with Dr Gala Romney. We may decide to repeat your echocardiogram before making a change Your oxygen needs to be on 5L/min continuous through the oximizer.  Follow with Dr Delton Coombes in 1 month or sooner if you have any problems.

## 2012-11-14 ENCOUNTER — Telehealth: Payer: Self-pay | Admitting: Emergency Medicine

## 2012-11-14 DIAGNOSIS — J449 Chronic obstructive pulmonary disease, unspecified: Secondary | ICD-10-CM

## 2012-11-14 DIAGNOSIS — I272 Pulmonary hypertension, unspecified: Secondary | ICD-10-CM

## 2012-11-14 NOTE — Telephone Encounter (Signed)
Patient Instructions    We will discuss possibly increasing your tadalafil with Dr Gala Romney. We may decide to repeat your echocardiogram before making a change  Your oxygen needs to be on 5L/min continuous through the oximizer.  Follow with Dr Delton Coombes in 1 month or sooner if you have any problems  ---  I spoke with daughter. Made her aware will send referral to [facility] Nothing further needed

## 2012-11-25 ENCOUNTER — Telehealth: Payer: Self-pay | Admitting: Emergency Medicine

## 2012-11-25 ENCOUNTER — Encounter (HOSPITAL_COMMUNITY): Payer: Self-pay | Admitting: *Deleted

## 2012-11-25 ENCOUNTER — Inpatient Hospital Stay (HOSPITAL_COMMUNITY)
Admission: EM | Admit: 2012-11-25 | Discharge: 2012-12-10 | DRG: 286 | Disposition: A | Discharge status: Hospice | Payer: Medicare Other | Attending: Internal Medicine | Admitting: Internal Medicine

## 2012-11-25 ENCOUNTER — Emergency Department (HOSPITAL_COMMUNITY): Payer: Medicare Other

## 2012-11-25 DIAGNOSIS — R5381 Other malaise: Secondary | ICD-10-CM | POA: Diagnosis present

## 2012-11-25 DIAGNOSIS — J441 Chronic obstructive pulmonary disease with (acute) exacerbation: Secondary | ICD-10-CM | POA: Diagnosis present

## 2012-11-25 DIAGNOSIS — I739 Peripheral vascular disease, unspecified: Secondary | ICD-10-CM | POA: Diagnosis present

## 2012-11-25 DIAGNOSIS — I498 Other specified cardiac arrhythmias: Secondary | ICD-10-CM | POA: Diagnosis present

## 2012-11-25 DIAGNOSIS — Z87891 Personal history of nicotine dependence: Secondary | ICD-10-CM

## 2012-11-25 DIAGNOSIS — N189 Chronic kidney disease, unspecified: Secondary | ICD-10-CM | POA: Diagnosis present

## 2012-11-25 DIAGNOSIS — Z8674 Personal history of sudden cardiac arrest: Secondary | ICD-10-CM

## 2012-11-25 DIAGNOSIS — I2789 Other specified pulmonary heart diseases: Secondary | ICD-10-CM | POA: Diagnosis present

## 2012-11-25 DIAGNOSIS — Z66 Do not resuscitate: Secondary | ICD-10-CM | POA: Diagnosis not present

## 2012-11-25 DIAGNOSIS — I252 Old myocardial infarction: Secondary | ICD-10-CM

## 2012-11-25 DIAGNOSIS — R0902 Hypoxemia: Secondary | ICD-10-CM

## 2012-11-25 DIAGNOSIS — Z515 Encounter for palliative care: Secondary | ICD-10-CM

## 2012-11-25 DIAGNOSIS — I509 Heart failure, unspecified: Secondary | ICD-10-CM | POA: Diagnosis present

## 2012-11-25 DIAGNOSIS — N179 Acute kidney failure, unspecified: Secondary | ICD-10-CM | POA: Diagnosis not present

## 2012-11-25 DIAGNOSIS — E039 Hypothyroidism, unspecified: Secondary | ICD-10-CM

## 2012-11-25 DIAGNOSIS — E785 Hyperlipidemia, unspecified: Secondary | ICD-10-CM | POA: Diagnosis present

## 2012-11-25 DIAGNOSIS — J449 Chronic obstructive pulmonary disease, unspecified: Secondary | ICD-10-CM

## 2012-11-25 DIAGNOSIS — Z9981 Dependence on supplemental oxygen: Secondary | ICD-10-CM

## 2012-11-25 DIAGNOSIS — R0602 Shortness of breath: Secondary | ICD-10-CM

## 2012-11-25 DIAGNOSIS — I4891 Unspecified atrial fibrillation: Secondary | ICD-10-CM

## 2012-11-25 DIAGNOSIS — I272 Pulmonary hypertension, unspecified: Secondary | ICD-10-CM

## 2012-11-25 DIAGNOSIS — I2609 Other pulmonary embolism with acute cor pulmonale: Principal | ICD-10-CM | POA: Diagnosis present

## 2012-11-25 DIAGNOSIS — Z8541 Personal history of malignant neoplasm of cervix uteri: Secondary | ICD-10-CM

## 2012-11-25 DIAGNOSIS — D649 Anemia, unspecified: Secondary | ICD-10-CM

## 2012-11-25 DIAGNOSIS — I129 Hypertensive chronic kidney disease with stage 1 through stage 4 chronic kidney disease, or unspecified chronic kidney disease: Secondary | ICD-10-CM | POA: Diagnosis present

## 2012-11-25 DIAGNOSIS — I5033 Acute on chronic diastolic (congestive) heart failure: Secondary | ICD-10-CM

## 2012-11-25 DIAGNOSIS — D638 Anemia in other chronic diseases classified elsewhere: Secondary | ICD-10-CM | POA: Diagnosis present

## 2012-11-25 DIAGNOSIS — J9620 Acute and chronic respiratory failure, unspecified whether with hypoxia or hypercapnia: Secondary | ICD-10-CM | POA: Diagnosis present

## 2012-11-25 DIAGNOSIS — I50810 Right heart failure, unspecified: Secondary | ICD-10-CM

## 2012-11-25 DIAGNOSIS — K219 Gastro-esophageal reflux disease without esophagitis: Secondary | ICD-10-CM | POA: Diagnosis present

## 2012-11-25 DIAGNOSIS — I10 Essential (primary) hypertension: Secondary | ICD-10-CM

## 2012-11-25 LAB — COMPREHENSIVE METABOLIC PANEL
ALT: 18 U/L (ref 0–35)
AST: 26 U/L (ref 0–37)
Albumin: 3.6 g/dL (ref 3.5–5.2)
Alkaline Phosphatase: 85 U/L (ref 39–117)
BUN: 14 mg/dL (ref 6–23)
Chloride: 108 mEq/L (ref 96–112)
Potassium: 3.7 mEq/L (ref 3.5–5.1)
Sodium: 141 mEq/L (ref 135–145)
Total Bilirubin: 0.5 mg/dL (ref 0.3–1.2)

## 2012-11-25 LAB — CBC WITH DIFFERENTIAL/PLATELET
Basophils Absolute: 0 10*3/uL (ref 0.0–0.1)
Basophils Relative: 0 % (ref 0–1)
Hemoglobin: 10 g/dL — ABNORMAL LOW (ref 12.0–15.0)
MCHC: 31.5 g/dL (ref 30.0–36.0)
Monocytes Relative: 7 % (ref 3–12)
Neutro Abs: 5.4 10*3/uL (ref 1.7–7.7)
Neutrophils Relative %: 81 % — ABNORMAL HIGH (ref 43–77)
Platelets: 247 10*3/uL (ref 150–400)

## 2012-11-25 MED ORDER — IPRATROPIUM BROMIDE 0.02 % IN SOLN
0.5000 mg | RESPIRATORY_TRACT | Status: DC
  Administered 2012-11-25 – 2012-11-30 (×30): 0.5 mg via RESPIRATORY_TRACT
  Filled 2012-11-25 (×31): qty 2.5

## 2012-11-25 MED ORDER — ALBUTEROL SULFATE (5 MG/ML) 0.5% IN NEBU
2.5000 mg | INHALATION_SOLUTION | RESPIRATORY_TRACT | Status: DC
  Administered 2012-11-25 – 2012-11-30 (×31): 2.5 mg via RESPIRATORY_TRACT
  Filled 2012-11-25 (×32): qty 0.5

## 2012-11-25 MED ORDER — LEVOFLOXACIN IN D5W 750 MG/150ML IV SOLN
750.0000 mg | INTRAVENOUS | Status: DC
  Administered 2012-11-25: 750 mg via INTRAVENOUS
  Filled 2012-11-25: qty 150

## 2012-11-25 MED ORDER — METHYLPREDNISOLONE SODIUM SUCC 125 MG IJ SOLR
125.0000 mg | Freq: Once | INTRAMUSCULAR | Status: AC
  Administered 2012-11-25: 125 mg via INTRAVENOUS
  Filled 2012-11-25: qty 2

## 2012-11-25 NOTE — Telephone Encounter (Signed)
Kelly Hart returned call - advised of the Tadalafil increase to 40mg  daily.  Kelly Hart okay with this change and verbalized her understanding.  Per Kelly Hart, a refill is NOT needed at this time.  Pt does have an appt scheduled with RB 8.27.14 @ 1145.  Will sign and forward to RB and FYI.

## 2012-11-25 NOTE — ED Notes (Signed)
Respiratory at bedside for breathing treatment.

## 2012-11-25 NOTE — ED Notes (Signed)
Critical care MD at bedside 

## 2012-11-25 NOTE — ED Notes (Signed)
The pts sats have been in the 70s all day even with home 02.  She has had nausea also.  Unknown if temp

## 2012-11-25 NOTE — ED Notes (Signed)
C/o pain in the back of her head and when she lies down her chest hurts on the lt

## 2012-11-25 NOTE — Telephone Encounter (Signed)
ATC to [facility] x1 > automated voice answered w/ "please enter to your access code."  There was no option to leave a message.  WCB.

## 2012-11-25 NOTE — Telephone Encounter (Signed)
ATC [name]. Was NA and not able to leave VM. I received a message to enter access code. wcb

## 2012-11-25 NOTE — Telephone Encounter (Signed)
I spoke with daughter. She stated pt feels like she is more SOB. Just trying to stand up she is very SOB. Her sats can drop into the 70's-80's. She used 5 liters O2 at rest and 6 liters w/ exertion. She also uses the oximyzer and this is cont flow. Kelly Hart wants to know if anything else can be done to help pt or has everything been done and no options left. Pt has pending appt with RB on 12/10/12. Please advise thanks  Allergies  Allergen Reactions  . Iodine Itching, Swelling and Other (See Comments)    Per patient-caused loss of sight for a few mins.   . Lisinopril Itching, Swelling and Other (See Comments)    Facial swelling-lips, tongue, etc.   . Other Other (See Comments)    All IV dyes-caused loss of sight for a few minutes, believes maybe argon dye.  Marland Kitchen Penicillins Itching

## 2012-11-25 NOTE — Consult Note (Addendum)
Admit date: 11/25/2012 Referring Physician  Dr. Gwendolyn Grant Primary Physician  Dr. Olena Leatherwood Primary Cardiologist  Dr. Gala Romney Reason for Consultation  SOB  HPI: This is a 79 yo woman with hx HTN and diastolic CHF, CRI, A Fib/MAT, pulmonary HTN with cor pulmonale,  hypothyroidism. Also hx tobacco use (30 pk-yrs) and COPD. Secondary PAH with cor pulmonale on milrinone infusion and started on tadalifil in 04/2011 at hospitalization. She is on chronic O2 at 2L/min started in 11/12. She has been experiencing exertional SOB, slowly progressive. She was seen in pulmonary clinic 7/30 for hypoxia with O2 sat 54% on 5L and increased SOB.  Her Tadalafil was increased to 40mg  daily today after she reported to Dr. Delton Coombes increasing SOB over the past week starting last Thursday.  Her sats have been dropping in to the 70-80's despite using O2 at 5L and 6L with exertion.  She has had some productive cough with brown sputum.  She denies any fever or chills.  She denies any LE edema.  She occasionally has some mild chest pain increased with deep breathing and some back discomfort.      PMH:   Past Medical History  Diagnosis Date  . Hypertension   . Renal insufficiency     baseline around cr 1.3  . Atrial fibrillation   . COPD (chronic obstructive pulmonary disease)   . CHF (congestive heart failure)     diastolic  . PEA (Pulseless electrical activity) Oct 2012  . PAD (peripheral artery disease)   . Hyperlipidemia   . Hypothyroidism   . History of cervical cancer   . Chronic diarrhea     secondary to treatment of her cervical cancer  . Hydronephrosis, left   . Pulmonary hypertension   . GERD (gastroesophageal reflux disease)   . Myocardial infarction      PSH:   Past Surgical History  Procedure Laterality Date  . Aortobifem bypass grafting    . Open thrombectomy  Oct 2012  . Pr vein bypass graft,aorto-fem-pop  02/04/2009  . Peripherally inserted central catheter insertion      Allergies:  Iodine;  Lisinopril; Other; and Penicillins Prior to Admit Meds:   (Not in a hospital admission) Fam HX:    Family History  Problem Relation Age of Onset  . Heart failure Brother   . Heart disease Father   . Throat cancer Sister   . Emphysema Father    Social HX:    History   Social History  . Marital Status: Divorced    Spouse Name: N/A    Number of Children: N/A  . Years of Education: N/A   Occupational History  . RETIRED     [employer]   Social History Main Topics  . Smoking status: Former Smoker -- 0.50 packs/day for 50 years    Types: Cigarettes    Quit date: 01/15/2011  . Smokeless tobacco: Never Used  . Alcohol Use: No  . Drug Use: No  . Sexually Active: Not on file   Other Topics Concern  . Not on file   Social History Narrative   She is widowed and lives alone.  She has 4 daughters who provide a lot of support.  She is a former Scientist, product/process development.     ROS:  All 11 ROS were addressed and are negative except what is stated in the HPI  Physical Exam: Blood pressure 135/75, pulse 108, temperature 98.3 F (36.8 C), temperature source Oral, resp. rate 26, SpO2 86.00%.    General:  Well developed, well nourished, in no acute distress Head: Eyes PERRLA, No xanthomas.   Normal cephalic and atramatic  Lungs: coarse BS at bases Heart:   Tachy and regular S1 S2 Pulses are 2+ & equal.2/6 SM at RUSB            No carotid bruit. No JVD.  No abdominal bruits. No femoral bruits. Abdomen: Bowel sounds are positive, abdomen soft and non-tender without masses  Extremities:   No clubbing, cyanosis or edema.  DP +1 Neuro: Alert and oriented X 3. Psych:  Good affect, responds appropriately    Labs:   Lab Results  Component Value Date   WBC 6.7 11/25/2012   HGB 10.0* 11/25/2012   HCT 31.7* 11/25/2012   MCV 84.5 11/25/2012   PLT 247 11/25/2012   No results found for this basename: NA, K, CL, CO2, BUN, CREATININE, CALCIUM, LABALBU, PROT, BILITOT, ALKPHOS, ALT, AST, GLUCOSE,  in the last  168 hours No results found for this basename: PTT   Lab Results  Component Value Date   INR 1.24 01/09/2012   INR 1.20 02/08/2011   INR 1.36 02/04/2009   Lab Results  Component Value Date   TROPONINI <0.30 01/08/2012     Lab Results  Component Value Date   CHOL 101 05/12/2011   No results found for this basename: HDL   No results found for this basename: LDLCALC   Lab Results  Component Value Date   TRIG 172* 05/12/2011   No results found for this basename: CHOLHDL   No results found for this basename: LDLDIRECT      Radiology:  No results found.  EKG:  Sinus tachycardia at 112bpm  ASSESSMENT:  1.  Acute SOB of ? Etiology  - most likely multifactorial fromacute on chronic diastolic CHF/COPD exacerbation with possible bronchitis.  She does have a left shift on her differential despite her WBC being normal.  She has had a cough productive of brownish sputum.  BNP is elevated but it appears to be chronically elevated in the past.  She has no JVD on exam and no LE edema.  I am not convinced this is primary acute CHF and feel it is more COPD exacerbation.  Her breathing has symptomatically improved in the ER with breathing treatments. 2.  Hypoxia secondary to pulmonary HTN/?acute CHF/?copd exacerbation 3.  HTN 4.  Tachycardia with history of MAT - HR appears to be at baseline based on Dr. Alford Highland OV note of 10/28/12 5.  Nonobstructive ASCAD by cath 2012  PLAN:   1.  Continue Milrinone infusion 2.  Pulmonary/CCM to admit 3.  Continue current diuretic management for now.  She has a chronically elevated BNP at baseline.    Quintella Reichert, MD  11/25/2012  11:12 PM

## 2012-11-25 NOTE — Telephone Encounter (Signed)
I talked to Dr Gala Romney, I think we can try to increase the tadalafil to 40mg  qd. Have her do this now, then I need to see her soon after

## 2012-11-25 NOTE — ED Notes (Signed)
MD Turner at bedside for pt evaluation and further plan of care  Updated. Pt on venti mask with increase in oxygen level. Pt continues to deny and pain or complaints at this time. INAD, resp e/u and will continue to monitor pt.

## 2012-11-25 NOTE — ED Provider Notes (Signed)
CSN: [id number]     Arrival date & time 11/25/12  2130 History     First MD Initiated Contact with Patient 11/25/12 2222     Chief Complaint  Patient presents with  . Shortness of Breath   (Consider location/radiation/quality/duration/timing/severity/associated sxs/prior Treatment) Patient is a 79 y.o. female presenting with shortness of breath. The history is provided by the patient.  Shortness of Breath Severity:  Severe Onset quality:  Gradual Timing:  Constant Progression:  Worsening Chronicity:  New Context: not emotional upset, not smoke exposure, not URI and not weather changes   Relieved by:  Nothing Worsened by:  Nothing tried Associated symptoms: chest pain (mild, occasional) and wheezing   Associated symptoms: no abdominal pain, no fever, no rash and no vomiting     Past Medical History  Diagnosis Date  . Hypertension   . Renal insufficiency     baseline around cr 1.3  . Atrial fibrillation   . COPD (chronic obstructive pulmonary disease)   . CHF (congestive heart failure)     diastolic  . PEA (Pulseless electrical activity) Oct 2012  . PAD (peripheral artery disease)   . Hyperlipidemia   . Hypothyroidism   . History of cervical cancer   . Chronic diarrhea     secondary to treatment of her cervical cancer  . Hydronephrosis, left   . Pulmonary hypertension   . GERD (gastroesophageal reflux disease)   . Myocardial infarction    Past Surgical History  Procedure Laterality Date  . Aortobifem bypass grafting    . Open thrombectomy  Oct 2012  . Pr vein bypass graft,aorto-fem-pop  02/04/2009  . Peripherally inserted central catheter insertion     Family History  Problem Relation Age of Onset  . Heart failure Brother   . Heart disease Father   . Throat cancer Sister   . Emphysema Father    History  Substance Use Topics  . Smoking status: Former Smoker -- 0.50 packs/day for 50 years    Types: Cigarettes    Quit date: 01/15/2011  . Smokeless tobacco:  Never Used  . Alcohol Use: No   OB History   Grav Para Term Preterm Abortions TAB SAB Ect Mult Living                 Review of Systems  Constitutional: Negative for fever.  Respiratory: Positive for shortness of breath and wheezing.   Cardiovascular: Positive for chest pain (mild, occasional).  Gastrointestinal: Negative for vomiting and abdominal pain.  Skin: Negative for rash.  All other systems reviewed and are negative.    Allergies  Iodine; Lisinopril; Other; and Penicillins  Home Medications   Current Outpatient Rx  Name  Route  Sig  Dispense  Refill  . albuterol (PROVENTIL) (2.5 MG/3ML) 0.083% nebulizer solution   Nebulization   Take 2.5 mg by nebulization every 6 (six) hours as needed for wheezing or shortness of breath.          Marland Kitchen amiodarone (PACERONE) 200 MG tablet   Oral   Take 0.5 tablets (100 mg total) by mouth daily.   30 tablet   6   . amLODipine (NORVASC) 10 MG tablet   Oral   Take 5 mg by mouth daily.         Marland Kitchen aspirin EC 81 MG tablet   Oral   Take 81 mg by mouth daily.           Marland Kitchen atorvastatin (LIPITOR) 40 MG tablet   Oral  Take 40 mg by mouth at bedtime.          . budesonide-formoterol (SYMBICORT) 160-4.5 MCG/ACT inhaler   Inhalation   Inhale 2 puffs into the lungs 2 (two) times daily.   1 Inhaler   6   . digoxin (LANOXIN) 0.125 MG tablet   Oral   Take 0.5 tablets (62.5 mcg total) by mouth daily.   15 tablet   6   . diphenhydramine-acetaminophen (TYLENOL PM) 25-500 MG TABS   Oral   Take 1 tablet by mouth at bedtime as needed (for sleep).          . furosemide (LASIX) 20 MG tablet   Oral   Take 20 mg by mouth every Monday, Wednesday, and Friday.          . levothyroxine (SYNTHROID, LEVOTHROID) 125 MCG tablet   Oral   Take 125 mcg by mouth daily before breakfast.         . LORazepam (ATIVAN) 1 MG tablet   Oral   Take 0.5 tablets (0.5 mg total) by mouth 2 (two) times daily as needed for anxiety (or dizziness).    15 tablet   0   . magnesium oxide (MAG-OX) 400 MG tablet   Oral   Take 400 mg by mouth daily.          . meclizine (ANTIVERT) 25 MG tablet   Oral   Take 25 mg by mouth 3 (three) times daily as needed for dizziness.         . Milrinone in Dextrose (MILRINONE-DEXTROSE IV)   Intravenous   Inject 0.25 mcg/kg/min into the vein. Concentration: 1 mg/mL (140 mL bag)         . Multiple Vitamins-Minerals (MULTIVITAMINS THER. W/MINERALS) TABS   Oral   Take 1 tablet by mouth daily.          . pantoprazole (PROTONIX) 40 MG tablet   Oral   Take 40 mg by mouth daily.         . potassium chloride SA (K-DUR,KLOR-CON) 20 MEQ tablet   Oral   Take 20 mEq by mouth every Monday, Wednesday, and Friday.         . Tadalafil, PAH, (ADCIRCA) 20 MG TABS   Oral   Take 20 mg by mouth daily.         . Acetylcysteine (N-ACETYL-L-CYSTEINE PO)   Oral   Take 1,000 mg by mouth daily.          BP 135/75  Pulse 108  Temp(Src) 98.3 F (36.8 C) (Oral)  Resp 26  SpO2 86% Physical Exam  Nursing note and vitals reviewed. Constitutional: She is oriented to person, place, and time. She appears well-developed and well-nourished. No distress.  HENT:  Head: Normocephalic and atraumatic.  Eyes: EOM are normal. Pupils are equal, round, and reactive to light.  Neck: Normal range of motion. Neck supple.  Cardiovascular: Regular rhythm.  Tachycardia present.  Exam reveals no friction rub.   No murmur heard. Pulmonary/Chest: She is in respiratory distress. She has wheezes (diffuse, moderate-severe). She has no rales.  Abdominal: Soft. She exhibits no distension. There is no tenderness. There is no rebound.  Musculoskeletal: Normal range of motion. She exhibits no edema.  Neurological: She is alert and oriented to person, place, and time.  Skin: She is not diaphoretic.    ED Course   Procedures (including critical care time)  Labs Reviewed  CBC WITH DIFFERENTIAL - Abnormal; Notable for the  following:  RBC 3.75 (*)    Hemoglobin 10.0 (*)    HCT 31.7 (*)    RDW 16.6 (*)    Neutrophils Relative % 81 (*)    Lymphocytes Relative 11 (*)    All other components within normal limits  COMPREHENSIVE METABOLIC PANEL - Abnormal; Notable for the following:    Glucose, Bld 113 (*)    Creatinine, Ser 1.28 (*)    GFR calc non Af Amer 39 (*)    GFR calc Af Amer 45 (*)    All other components within normal limits  PRO B NATRIURETIC PEPTIDE - Abnormal; Notable for the following:    Pro B Natriuretic peptide (BNP) 5459.0 (*)    All other components within normal limits  TROPONIN I  DIGOXIN LEVEL   No results found. 1. Shortness of breath   2. COPD (chronic obstructive pulmonary disease)   3. CHF exacerbation     MDM   The patient is a 79 year old female with history of severe COPD and CHF requiring lower known drip continuously for past 2 years. She presented with shortness of breath. Began today. Sats in the 70s on her normal 5 L of oxygen on by nasal cannula at baseline. Patient denies any cough, fevers. Here, patient is tachycardic which is her baseline. She has oxygen saturations in the 60s to 80s, however is mostly in the 80s. She is mildly labored with her breathing. On 6 L nasal cannula, she is maintaining her sats in the 80s. She has normal blood pressures. Lungs with diffuse wheezing. Chest x-ray without gross volume overload. She has no JVD or peripheral edema. Labs show BNP around her baseline at around 6000. His normal white count. Chest x-ray does not show pneumonia. I will admit her to the pulmonary critical care service for COPD exacerbation. Dr. Armanda Magic of cardiology also saw patient in consultation in ED and is in agreement with plan.  Dagmar Hait, MD 11/26/12 1051

## 2012-11-26 DIAGNOSIS — J9620 Acute and chronic respiratory failure, unspecified whether with hypoxia or hypercapnia: Secondary | ICD-10-CM

## 2012-11-26 DIAGNOSIS — I4891 Unspecified atrial fibrillation: Secondary | ICD-10-CM

## 2012-11-26 DIAGNOSIS — D649 Anemia, unspecified: Secondary | ICD-10-CM

## 2012-11-26 DIAGNOSIS — R0602 Shortness of breath: Secondary | ICD-10-CM

## 2012-11-26 LAB — BASIC METABOLIC PANEL
BUN: 12 mg/dL (ref 6–23)
CO2: 22 mEq/L (ref 19–32)
Chloride: 106 mEq/L (ref 96–112)
Creatinine, Ser: 1.14 mg/dL — ABNORMAL HIGH (ref 0.50–1.10)

## 2012-11-26 LAB — CBC
HCT: 29.2 % — ABNORMAL LOW (ref 36.0–46.0)
MCV: 84.4 fL (ref 78.0–100.0)
RBC: 3.46 MIL/uL — ABNORMAL LOW (ref 3.87–5.11)
WBC: 4.5 10*3/uL (ref 4.0–10.5)

## 2012-11-26 LAB — URINALYSIS, ROUTINE W REFLEX MICROSCOPIC
Glucose, UA: NEGATIVE mg/dL
Hgb urine dipstick: NEGATIVE
Leukocytes, UA: NEGATIVE
Specific Gravity, Urine: 1.007 (ref 1.005–1.030)
pH: 5.5 (ref 5.0–8.0)

## 2012-11-26 LAB — CARBOXYHEMOGLOBIN: Carboxyhemoglobin: 1.5 % (ref 0.5–1.5)

## 2012-11-26 LAB — MRSA PCR SCREENING: MRSA by PCR: NEGATIVE

## 2012-11-26 LAB — GLUCOSE, CAPILLARY: Glucose-Capillary: 132 mg/dL — ABNORMAL HIGH (ref 70–99)

## 2012-11-26 MED ORDER — HEPARIN SODIUM (PORCINE) 5000 UNIT/ML IJ SOLN
5000.0000 [IU] | Freq: Three times a day (TID) | INTRAMUSCULAR | Status: DC
  Administered 2012-11-26 – 2012-11-27 (×4): 5000 [IU] via SUBCUTANEOUS
  Filled 2012-11-26 (×7): qty 1

## 2012-11-26 MED ORDER — FUROSEMIDE 20 MG PO TABS
20.0000 mg | ORAL_TABLET | ORAL | Status: DC
  Filled 2012-11-26: qty 1

## 2012-11-26 MED ORDER — IPRATROPIUM BROMIDE 0.02 % IN SOLN: 0.5000 mg | RESPIRATORY_TRACT | Status: DC

## 2012-11-26 MED ORDER — TADALAFIL (PAH) 20 MG PO TABS
20.0000 mg | ORAL_TABLET | Freq: Every day | ORAL | Status: DC
  Administered 2012-11-26 – 2012-11-28 (×3): 20 mg via ORAL
  Filled 2012-11-26 (×4): qty 1

## 2012-11-26 MED ORDER — FUROSEMIDE 10 MG/ML IJ SOLN
40.0000 mg | Freq: Once | INTRAMUSCULAR | Status: AC
  Administered 2012-11-26: 40 mg via INTRAVENOUS
  Filled 2012-11-26: qty 4

## 2012-11-26 MED ORDER — MAGNESIUM OXIDE 400 (241.3 MG) MG PO TABS
400.0000 mg | ORAL_TABLET | Freq: Every day | ORAL | Status: DC
  Administered 2012-11-26 – 2012-12-10 (×15): 400 mg via ORAL
  Filled 2012-11-26 (×15): qty 1

## 2012-11-26 MED ORDER — LORAZEPAM 0.5 MG PO TABS
0.5000 mg | ORAL_TABLET | Freq: Two times a day (BID) | ORAL | Status: DC | PRN
  Administered 2012-11-26 – 2012-11-28 (×3): 0.5 mg via ORAL
  Filled 2012-11-26 (×3): qty 1

## 2012-11-26 MED ORDER — LEVOFLOXACIN IN D5W 500 MG/100ML IV SOLN
500.0000 mg | INTRAVENOUS | Status: DC
  Filled 2012-11-26: qty 100

## 2012-11-26 MED ORDER — ALBUTEROL SULFATE (5 MG/ML) 0.5% IN NEBU: 2.5000 mg | INHALATION_SOLUTION | RESPIRATORY_TRACT | Status: DC | PRN

## 2012-11-26 MED ORDER — LEVOTHYROXINE SODIUM 125 MCG PO TABS
125.0000 ug | ORAL_TABLET | Freq: Every day | ORAL | Status: DC
  Administered 2012-11-26 – 2012-12-10 (×15): 125 ug via ORAL
  Filled 2012-11-26 (×16): qty 1

## 2012-11-26 MED ORDER — METHYLPREDNISOLONE SODIUM SUCC 125 MG IJ SOLR
80.0000 mg | Freq: Two times a day (BID) | INTRAMUSCULAR | Status: DC
  Administered 2012-11-26 – 2012-11-27 (×2): 80 mg via INTRAVENOUS
  Filled 2012-11-26 (×4): qty 1.28

## 2012-11-26 MED ORDER — ADULT MULTIVITAMIN W/MINERALS CH
1.0000 | ORAL_TABLET | Freq: Every day | ORAL | Status: DC
  Administered 2012-11-26 – 2012-12-10 (×14): 1 via ORAL
  Filled 2012-11-26 (×15): qty 1

## 2012-11-26 MED ORDER — POTASSIUM CHLORIDE CRYS ER 20 MEQ PO TBCR
20.0000 meq | EXTENDED_RELEASE_TABLET | ORAL | Status: DC
  Filled 2012-11-26: qty 1

## 2012-11-26 MED ORDER — ALBUTEROL SULFATE (5 MG/ML) 0.5% IN NEBU: 2.5000 mg | INHALATION_SOLUTION | RESPIRATORY_TRACT | Status: DC

## 2012-11-26 MED ORDER — MECLIZINE HCL 25 MG PO TABS
25.0000 mg | ORAL_TABLET | Freq: Three times a day (TID) | ORAL | Status: DC | PRN
  Filled 2012-11-26: qty 1

## 2012-11-26 MED ORDER — AMIODARONE HCL 100 MG PO TABS
100.0000 mg | ORAL_TABLET | Freq: Every day | ORAL | Status: DC
  Filled 2012-11-26: qty 1

## 2012-11-26 MED ORDER — ATORVASTATIN CALCIUM 40 MG PO TABS
40.0000 mg | ORAL_TABLET | Freq: Every day | ORAL | Status: DC
  Administered 2012-11-26 – 2012-12-04 (×10): 40 mg via ORAL
  Filled 2012-11-26 (×12): qty 1

## 2012-11-26 MED ORDER — POTASSIUM CHLORIDE CRYS ER 20 MEQ PO TBCR
40.0000 meq | EXTENDED_RELEASE_TABLET | Freq: Once | ORAL | Status: AC
  Administered 2012-11-26: 40 meq via ORAL
  Filled 2012-11-26: qty 2

## 2012-11-26 MED ORDER — IPRATROPIUM BROMIDE 0.02 % IN SOLN
0.5000 mg | RESPIRATORY_TRACT | Status: DC | PRN
  Administered 2012-11-27: 0.5 mg via RESPIRATORY_TRACT

## 2012-11-26 MED ORDER — AMLODIPINE BESYLATE 5 MG PO TABS
5.0000 mg | ORAL_TABLET | Freq: Every day | ORAL | Status: DC
  Administered 2012-11-26: 5 mg via ORAL
  Filled 2012-11-26 (×2): qty 1

## 2012-11-26 MED ORDER — PANTOPRAZOLE SODIUM 40 MG PO TBEC
40.0000 mg | DELAYED_RELEASE_TABLET | Freq: Every day | ORAL | Status: DC
  Administered 2012-11-26 – 2012-12-10 (×15): 40 mg via ORAL
  Filled 2012-11-26 (×15): qty 1

## 2012-11-26 MED ORDER — BUDESONIDE-FORMOTEROL FUMARATE 160-4.5 MCG/ACT IN AERO
2.0000 | INHALATION_SPRAY | Freq: Two times a day (BID) | RESPIRATORY_TRACT | Status: DC
  Administered 2012-11-26 – 2012-12-09 (×28): 2 via RESPIRATORY_TRACT
  Filled 2012-11-26: qty 6

## 2012-11-26 MED ORDER — MILRINONE IN DEXTROSE 20 MG/100ML IV SOLN
0.1250 ug/kg/min | INTRAVENOUS | Status: DC
  Administered 2012-11-26: 0.25 ug/kg/min via INTRAVENOUS
  Administered 2012-11-26 – 2012-12-04 (×7): 0.375 ug/kg/min via INTRAVENOUS
  Administered 2012-12-05: 0.125 ug/kg/min via INTRAVENOUS
  Filled 2012-11-26 (×14): qty 100

## 2012-11-26 MED ORDER — DIGOXIN 125 MCG PO TABS
0.0625 ug | ORAL_TABLET | Freq: Every day | ORAL | Status: DC
  Administered 2012-11-26: 62.5 ug via ORAL
  Filled 2012-11-26 (×2): qty 0.5

## 2012-11-26 MED ORDER — ASPIRIN EC 81 MG PO TBEC
81.0000 mg | DELAYED_RELEASE_TABLET | Freq: Every day | ORAL | Status: DC
  Administered 2012-11-26 – 2012-12-10 (×15): 81 mg via ORAL
  Filled 2012-11-26 (×15): qty 1

## 2012-11-26 MED ORDER — METHYLPREDNISOLONE SODIUM SUCC 125 MG IJ SOLR
80.0000 mg | Freq: Three times a day (TID) | INTRAMUSCULAR | Status: DC
  Administered 2012-11-26: 80 mg via INTRAVENOUS
  Filled 2012-11-26 (×4): qty 1.28

## 2012-11-26 NOTE — Progress Notes (Signed)
  Echocardiogram 2D Echocardiogram has been performed.  Jorje Guild 11/26/2012, 12:03 PM

## 2012-11-26 NOTE — Consult Note (Addendum)
Advanced Heart Failure Team Consult Note   Primary Cardiologist:  DB  Reason for Consultation: Respiratory failure  HPI:    Kelly Hart is a 79 year old AA female with of pulseless electrical activity arrest, COPD, right heart failure secondary to cor pulmonale, pulmonary arterial hypertension (multifactorial but COPD plays a role), chronic respiratory failure, multifocal atrial tachycardia, peripheral arterial disease status post aortobifem bypass.  RHC 01/09/12 on milrinone  RA = 4  RV = 56/1/5  PA = 66/24 (45)  LPCW = 13  RPCW = 11  Fick cardiac output/index = 5.6/3.8  PVR = 8.0 Woods  FA sat = 90%  PA sat = 69%, 70%  SVC sat = 69%   Echo 01/08/12: LVEF 60-70%. Ventricular septum showed septal bounce. RV mod-severely dilated. Atrial septum bowed right to left. PASP 82 mmHG.  Echo (2/14): EF 55-60%, RV dilated with mild to moderate systolic dysfunction, D-shaped septum, PA systolic pressure 83 mmHg, severe TR.   [name] done very well on milrinone at home x 2 years. Over last few week has had progressive DOE and unable to keep her sats >90% despite 5L O2. Occasional exertional presyncope. No fevers, chills, cough or edema.   Review of Systems: [y] = yes, [ ]  = no   General: Weight gain [ ] ; Weight loss [ ] ; Anorexia [ ] ; Fatigue [ ] ; Fever [ ] ; Chills [ ] ; Weakness [ ]   Cardiac: Chest pain/pressure [ ] ; Resting SOB [ ] ; Exertional SOB Cove.Etienne ]; Orthopnea [ ] ; Pedal Edema [ ] ; Palpitations [ ] ; Syncope [ ] ; Presyncope [ ] ; Paroxysmal nocturnal dyspnea[ ]   Pulmonary: Cough [ ] ; Wheezing[ ] ; Hemoptysis[ ] ; Sputum [ ] ; Snoring [ ]   GI: Vomiting[ ] ; Dysphagia[ ] ; Melena[ ] ; Hematochezia [ ] ; Heartburn[ ] ; Abdominal pain [ ] ; Constipation [ ] ; Diarrhea [ ] ; BRBPR [ ]   GU: Hematuria[ ] ; Dysuria [ ] ; Nocturia[ ]   Vascular: Pain in legs with walking [ ] ; Pain in feet with lying flat [ ] ; Non-healing sores [ ] ; Stroke [ ] ; TIA [ ] ; Slurred speech [ ] ;  Neuro: Headaches[ ] ; Vertigo[ ] ; Seizures[ ] ;  Paresthesias[ ] ;Blurred vision [ ] ; Diplopia [ ] ; Vision changes [ ]   Ortho/Skin: Arthritis [ ] ; Joint pain [ ] ; Muscle pain [ ] ; Joint swelling [ ] ; Back Pain [ ] ; Rash [ ]   Psych: Depression[ ] ; Anxiety[ ]   Heme: Bleeding problems [ ] ; Clotting disorders [ ] ; Anemia Cove.[place]ienne ]  Endocrine: Diabetes [ ] ; Thyroid dysfunction[ ]   Home Medications Prior to Admission medications   Medication Sig Start Date End Date Taking? Authorizing Provider  albuterol (PROVENTIL) (2.5 MG/3ML) 0.083% nebulizer solution Take 2.5 mg by nebulization every 6 (six) hours as needed for wheezing or shortness of breath.    Yes Historical Provider, MD  amiodarone (PACERONE) 200 MG tablet Take 0.5 tablets (100 mg total) by mouth daily. 04/07/12  Yes Hadassah Pais, PA-C  amLODipine (NORVASC) 10 MG tablet Take 5 mg by mouth daily.   Yes Historical Provider, MD  aspirin EC 81 MG tablet Take 81 mg by mouth daily.     Yes Historical Provider, MD  atorvastatin (LIPITOR) 40 MG tablet Take 40 mg by mouth at bedtime.  05/03/11  Yes Dolores Patty, MD  budesonide-formoterol [facility]) 160-4.5 MCG/ACT inhaler Inhale 2 puffs into the lungs 2 (two) times daily. 02/27/12  Yes Leslye Peer, MD  digoxin (LANOXIN) 0.125 MG tablet Take 0.5 tablets (62.5 mcg total) by mouth daily. 10/11/11  Yes Amy D Clegg, NP  diphenhydramine-acetaminophen (TYLENOL PM) 25-500 MG TABS Take 1 tablet by mouth at bedtime as needed (for sleep).    Yes Historical Provider, MD  furosemide (LASIX) 20 MG tablet Take 20 mg by mouth every Monday, Wednesday, and Friday.  02/11/12  Yes Amy D Clegg, NP  levothyroxine (SYNTHROID, LEVOTHROID) 125 MCG tablet Take 125 mcg by mouth daily before breakfast.   Yes Historical Provider, MD  LORazepam (ATIVAN) 1 MG tablet Take 0.5 tablets (0.5 mg total) by mouth 2 (two) times daily as needed for anxiety (or dizziness). 08/31/12  Yes Donnetta Hutching, MD  magnesium oxide (MAG-OX) 400 MG tablet Take 400 mg by mouth daily.    Yes Historical  Provider, MD  meclizine (ANTIVERT) 25 MG tablet Take 25 mg by mouth 3 (three) times daily as needed for dizziness.   Yes Historical Provider, MD  Milrinone in Dextrose (MILRINONE-DEXTROSE IV) Inject 0.25 mcg/kg/min into the vein. Concentration: 1 mg/mL (140 mL bag)   Yes Historical Provider, MD  Multiple Vitamins-Minerals (MULTIVITAMINS THER. W/MINERALS) TABS Take 1 tablet by mouth daily.    Yes Historical Provider, MD  pantoprazole (PROTONIX) 40 MG tablet Take 40 mg by mouth daily. 02/22/11  Yes Amy D Filbert Schilder, NP  potassium chloride SA (K-DUR,KLOR-CON) 20 MEQ tablet Take 20 mEq by mouth every Monday, Wednesday, and Friday. 08/15/12  Yes Amy D Clegg, NP  Tadalafil, PAH, (ADCIRCA) 20 MG TABS Take 20 mg by mouth daily.   Yes Historical Provider, MD  Acetylcysteine (N-ACETYL-L-CYSTEINE PO) Take 1,000 mg by mouth daily.    Historical Provider, MD    Past Medical History: Past Medical History  Diagnosis Date  . Hypertension   . Renal insufficiency     baseline around cr 1.3  . Atrial fibrillation   . COPD (chronic obstructive pulmonary disease)   . CHF (congestive heart failure)     diastolic  . PEA (Pulseless electrical activity) Oct 2012  . PAD (peripheral artery disease)   . Hyperlipidemia   . Hypothyroidism   . History of cervical cancer   . Chronic diarrhea     secondary to treatment of her cervical cancer  . Hydronephrosis, left   . Pulmonary hypertension   . GERD (gastroesophageal reflux disease)   . Myocardial infarction     Past Surgical History: Past Surgical History  Procedure Laterality Date  . Aortobifem bypass grafting    . Open thrombectomy  Oct 2012  . Pr vein bypass graft,aorto-fem-pop  02/04/2009  . Peripherally inserted central catheter insertion      Family History: Family History  Problem Relation Age of Onset  . Heart failure Brother   . Heart disease Father   . Throat cancer Sister   . Emphysema Father     Social History: History   Social History  .  Marital Status: Divorced    Spouse Name: N/A    Number of Children: N/A  . Years of Education: N/A   Occupational History  . RETIRED     [employer]   Social History Main Topics  . Smoking status: Former Smoker -- 0.50 packs/day for 50 years    Types: Cigarettes    Quit date: 01/15/2011  . Smokeless tobacco: Never Used  . Alcohol Use: No  . Drug Use: No  . Sexual Activity: None   Other Topics Concern  . None   Social History Narrative   She is widowed and lives alone.  She has 4 daughters who provide a lot  of support.  She is a former Scientist, product/process development.    Allergies:  Allergies  Allergen Reactions  . Iodine Itching, Swelling and Other (See Comments)    Per patient-caused loss of sight for a few mins.   . Lisinopril Itching, Swelling and Other (See Comments)    Facial swelling-lips, tongue, etc.   . Other Other (See Comments)    All IV dyes-caused loss of sight for a few minutes, believes maybe argon dye.  Marland Kitchen Penicillins Itching    Objective:    Vital Signs:   Temp:  [97.6 F (36.4 C)-98.3 F (36.8 C)] 97.6 F (36.4 C) (08/13 0400) Pulse Rate:  [98-118] 100 (08/13 0600) Resp:  [14-29] 14 (08/13 0600) BP: (111-135)/(61-109) 121/74 mmHg (08/13 0600) SpO2:  [68 %-100 %] 98 % (08/13 0600) FiO2 (%):  [50 %] 50 % (08/13 0517) Weight:  [48.8 kg (107 lb 9.4 oz)-48.9 kg (107 lb 12.9 oz)] 48.9 kg (107 lb 12.9 oz) (08/13 0200) Last BM Date: 11/25/12  Weight change: Filed Weights   11/26/12 0017 11/26/12 0200  Weight: 48.8 kg (107 lb 9.4 oz) 48.9 kg (107 lb 12.9 oz)    Intake/Output:   Intake/Output Summary (Last 24 hours) at 11/26/12 5409 Last data filed at 11/26/12 0600  Gross per 24 hour  Intake  14.37 ml  Output    100 ml  Net -85.63 ml     Physical Exam: General: Thin. Frail Wearing face mask. Talks in full sentences, HEENT: normal  Neck: supple. JVP to jaw Carotids 2+ bilaterally; no bruits. No lymphadenopathy or thryomegaly appreciated.  Cor: PMI  normal.Tachycardic, regular rhythm. Loud TR murmur  +prominent s2. +RV lift  Lungs: Decreased breath sounds throughout, occasional rhonchi, no wheezes  Abdomen: soft, nontender, nondistended. No hepatosplenomegaly. No bruits or masses. Good bowel sounds.  Extremities: no cyanosis, clubbing, rash, edema. RUE PICC  Neuro: alert & orientedx3, cranial nerves grossly intact. Moves all 4 extremities w/o difficulty. Affect pleasant.   Telemetry: Sinus tach  Labs: Basic Metabolic Panel:  Recent Labs Lab 11/25/12 2230 11/26/12 0444  NA 141 139  K 3.7 3.6  CL 108 106  CO2 22 22  GLUCOSE 113* 144*  BUN 14 12  CREATININE 1.28* 1.14*  CALCIUM 8.9 8.7    Liver Function Tests:  Recent Labs Lab 11/25/12 2230  AST 26  ALT 18  ALKPHOS 85  BILITOT 0.5  PROT 6.9  ALBUMIN 3.6   No results found for this basename: LIPASE, AMYLASE,  in the last 168 hours No results found for this basename: AMMONIA,  in the last 168 hours  CBC:  Recent Labs Lab 11/25/12 2230 11/26/12 0444  WBC 6.7 4.5  NEUTROABS 5.4  --   HGB 10.0* 9.3*  HCT 31.7* 29.2*  MCV 84.5 84.4  PLT 247 222    Cardiac Enzymes:  Recent Labs Lab 11/25/12 2231  TROPONINI <0.30    BNP: BNP (last 3 results)  Recent Labs  01/07/12 2229 10/28/12 1418 11/25/12 2231  PROBNP 1049.0* 3408.0* 5459.0*    CBG:  Recent Labs Lab 11/26/12 0110  GLUCAP 132*    Coagulation Studies: No results found for this basename: LABPROT, INR,  in the last 72 hours  Other results:  Imaging: Dg Chest Port 1 View  11/25/2012   *RADIOLOGY REPORT*  Clinical Data: Off and shortness of breath.  PORTABLE CHEST - 1 VIEW  Comparison: Chest x-ray 10/18/2012.  Findings: Right upper extremity PICC with tip terminating in the distal superior vena  cava. There is cephalization of the pulmonary vasculature and slight indistinctness of the interstitial markings suggestive of mild pulmonary edema.  Mild cardiomegaly.  Small right pleural  effusion.  Extensive atherosclerosis in the thoracic aorta and great vessels.  IMPRESSION: 1.  Findings, as above, concerning for mild congestive heart failure. 2.  Extensive atherosclerosis.   Original Report Authenticated By: Trudie Reed, [name]      Medications:     Current Medications: . ipratropium  0.5 mg Nebulization Q4H   And  . albuterol  2.5 mg Nebulization Q4H  . amiodarone  100 mg Oral Daily  . amLODipine  5 mg Oral Daily  . aspirin EC  81 mg Oral Daily  . atorvastatin  40 mg Oral QHS  . budesonide-formoterol  2 puff Inhalation BID  . digoxin  62.5 mcg Oral Daily  . furosemide  20 mg Oral Q M,W,F  . heparin subcutaneous  5,000 Units Subcutaneous Q8H  . [START ON 11/27/2012] levofloxacin (LEVAQUIN) IV  500 mg Intravenous Q48H  . levothyroxine  125 mcg Oral QAC breakfast  . magnesium oxide  400 mg Oral Daily  . methylPREDNISolone (SOLU-MEDROL) injection  80 mg Intravenous Q8H  . multivitamin with minerals  1 tablet Oral Daily  . pantoprazole  40 mg Oral Daily  . potassium chloride SA  20 mEq Oral Q M,W,F  . Tadalafil (PAH)  20 mg Oral Daily     Infusions: . milrinone 0.25 mcg/kg/min (11/26/12 0207)      Assessment:   1. Acute on chronic respiratory failure 2. Cor pulmonale, acute on chronic 3. Acute on chronic diastolic HF. 4. Severe COPD 5. Mixed PAH 6. PAH   Plan/Discussion:    Kelly Hart is a delightful 79 y/o woman with severe COPD, mixed PAH and RV failure who has been well maintained on milrinone for almost 2 years after a PEA arrest.   I am unclear as to what has caused her decompensation and suspect it may be multifactorial. Her BP is well preserved so argues against progressive RV failure but her PA pressures have been very high. Based on CXR and pBNP, I am hoping she is just having a mild flare of her diastolic HF. Will check echo, CVP and co-ox. Give one-dose IV lasix. Hold amio for now. We may need to consider going up on milrinone or considering  Flolan (we didn't do this in past as left-sided pressure were also pretty high initially)  Agree with steroid, nebs and abx for now but likely can pull back soon. Appreciate CCM care. I will follow.   Length of Stay: 1  Kelly Hart 11/26/2012, 7:22 AM  Advanced Heart Failure Team Pager [phone] (M-F; 7a - 4p)  Please contact [facility] for night-coverage after hours (4p -7a ) and weekends on [email or web address]  Addendum: Co-ox is 45%. I am worried she has worsening PH and RV failure. Increase milrinone to 0.375. Will get echo today. Will probably need RHC tomorrow or Friday.  Kelly Beckles,MD 7:55 AM

## 2012-11-26 NOTE — Progress Notes (Signed)
Chaplain Note:  Chaplain visited with pt who was resting in bed, awake, alert, and oriented.  Pt's daughter was at bedside in support of pt.  At pt's request, chaplain assisted pt in completing a health care power of attorney.  Document was signed in the presence of witnesses and notarized.  Chaplain made copies of the original document.  Chaplain gave one copy to the pt's nurse for placement in pt's chart.  Chaplain gave the pt the original document and three copies of the original.  Chaplain provided spiritual comfort, support, and prayer for pt and pt's daughter.  Pt, pt's daughter, and pt's nurse expressed appreciation for chaplain support.  Chaplain will follow up as needed.   11/26/12 1400  Clinical Encounter Type  Visited With Patient and family together  Visit Type Spiritual support;Other (Comment) (Advanced Directives)  Referral From Nurse  Spiritual Encounters  Spiritual Needs Prayer;Emotional;Other (Comment) (Advanced Directive)  Stress Factors  Patient Stress Factors Health changes;Major life changes  Family Stress Factors Major life changes  Advance Directives (For Healthcare)  Advance Directive Patient has advance directive, copy in chart  Type of Advance Directive Healthcare Power of Attorney  Patient requests advance directive information Advance directive packet given [facility] Power of Attorney completed and placed in chart.)   Verdie Shire, [place] [phone]

## 2012-11-26 NOTE — Progress Notes (Signed)
VASCULAR LAB PRELIMINARY  PRELIMINARY  PRELIMINARY  PRELIMINARY  Bilateral lower extremity venous duplex  completed.    Preliminary report:  Bilateral:  No evidence of DVT, superficial thrombosis, or Baker's Cyst.    Keelen Quevedo, RVT 11/26/2012, 11:48 AM

## 2012-11-26 NOTE — H&P (Signed)
PULMONARY  / CRITICAL CARE MEDICINE  Name: Kelly Hart MRN: [id number] DOB: [birth year]    ADMISSION DATE:  11/25/2012 CONSULTATION DATE:  11/25/2012  REFERRING MD :  EDP PRIMARY SERVICE:  PCCM  CHIEF COMPLAINT:  Acute respiratory failure  BRIEF PATIENT DESCRIPTION: 79 yo with past medical history of COPD (Dr. Delton Coombes), CHF (Dr. Gala Romney) and pulmonary hypertesnion on Milrinone and oxygen admitted for acute on chronic hypoxemic respiratory failure.  SIGNIFICANT EVENTS / STUDIES:   LINES / TUBES:  CULTURES:  ANTIBIOTICS: Levaquin 8/12 >>>  HISTORY OF PRESENT ILLNESS:  79 yo with past medical history of COPD (Dr. Delton Coombes), CHF (Dr. Gala Romney) and pulmonary hypertesnion, on home oxygen @ 5 L with baseline SpO2 = 88% and home Milrinone infusion admitted with weakness and progressive dyspnea since 2 weeks ago.  Denies orthopnea, paroxysmal nocturnal dyspnea, chest pain, palpitations, pedal edema, fever, chills , cough.  PAST MEDICAL HISTORY :  Past Medical History  Diagnosis Date  . Hypertension   . Renal insufficiency     baseline around cr 1.3  . Atrial fibrillation   . COPD (chronic obstructive pulmonary disease)   . CHF (congestive heart failure)     diastolic  . PEA (Pulseless electrical activity) Oct 2012  . PAD (peripheral artery disease)   . Hyperlipidemia   . Hypothyroidism   . History of cervical cancer   . Chronic diarrhea     secondary to treatment of her cervical cancer  . Hydronephrosis, left   . Pulmonary hypertension   . GERD (gastroesophageal reflux disease)   . Myocardial infarction    Past Surgical History  Procedure Laterality Date  . Aortobifem bypass grafting    . Open thrombectomy  Oct 2012  . Pr vein bypass graft,aorto-fem-pop  02/04/2009  . Peripherally inserted central catheter insertion     Prior to Admission medications   Medication Sig Start Date End Date Taking? Authorizing Provider  albuterol (PROVENTIL) (2.5 MG/3ML) 0.083% nebulizer  solution Take 2.5 mg by nebulization every 6 (six) hours as needed for wheezing or shortness of breath.    Yes Historical Provider, MD  amiodarone (PACERONE) 200 MG tablet Take 0.5 tablets (100 mg total) by mouth daily. 04/07/12  Yes Hadassah Pais, PA-C  amLODipine (NORVASC) 10 MG tablet Take 5 mg by mouth daily.   Yes Historical Provider, MD  aspirin EC 81 MG tablet Take 81 mg by mouth daily.     Yes Historical Provider, MD  atorvastatin (LIPITOR) 40 MG tablet Take 40 mg by mouth at bedtime.  05/03/11  Yes Dolores Patty, MD  budesonide-formoterol [facility]) 160-4.5 MCG/ACT inhaler Inhale 2 puffs into the lungs 2 (two) times daily. 02/27/12  Yes Leslye Peer, MD  digoxin (LANOXIN) 0.125 MG tablet Take 0.5 tablets (62.5 mcg total) by mouth daily. 10/11/11  Yes Amy D Clegg, NP  diphenhydramine-acetaminophen (TYLENOL PM) 25-500 MG TABS Take 1 tablet by mouth at bedtime as needed (for sleep).    Yes Historical Provider, MD  furosemide (LASIX) 20 MG tablet Take 20 mg by mouth every Monday, Wednesday, and Friday.  02/11/12  Yes Amy D Clegg, NP  levothyroxine (SYNTHROID, LEVOTHROID) 125 MCG tablet Take 125 mcg by mouth daily before breakfast.   Yes Historical Provider, MD  LORazepam (ATIVAN) 1 MG tablet Take 0.5 tablets (0.5 mg total) by mouth 2 (two) times daily as needed for anxiety (or dizziness). 08/31/12  Yes Donnetta Hutching, MD  magnesium oxide (MAG-OX) 400 MG tablet Take 400 mg by  mouth daily.    Yes Historical Provider, MD  meclizine (ANTIVERT) 25 MG tablet Take 25 mg by mouth 3 (three) times daily as needed for dizziness.   Yes Historical Provider, MD  Milrinone in Dextrose (MILRINONE-DEXTROSE IV) Inject 0.25 mcg/kg/min into the vein. Concentration: 1 mg/mL (140 mL bag)   Yes Historical Provider, MD  Multiple Vitamins-Minerals (MULTIVITAMINS THER. W/MINERALS) TABS Take 1 tablet by mouth daily.    Yes Historical Provider, MD  pantoprazole (PROTONIX) 40 MG tablet Take 40 mg by mouth daily. 02/22/11   Yes Amy D Filbert Schilder, NP  potassium chloride SA (K-DUR,KLOR-CON) 20 MEQ tablet Take 20 mEq by mouth every Monday, Wednesday, and Friday. 08/15/12  Yes Amy D Clegg, NP  Tadalafil, PAH, (ADCIRCA) 20 MG TABS Take 20 mg by mouth daily.   Yes Historical Provider, MD  Acetylcysteine (N-ACETYL-L-CYSTEINE PO) Take 1,000 mg by mouth daily.    Historical Provider, MD   Allergies  Allergen Reactions  . Iodine Itching, Swelling and Other (See Comments)    Per patient-caused loss of sight for a few mins.   . Lisinopril Itching, Swelling and Other (See Comments)    Facial swelling-lips, tongue, etc.   . Other Other (See Comments)    All IV dyes-caused loss of sight for a few minutes, believes maybe argon dye.  Marland Kitchen Penicillins Itching   FAMILY HISTORY:  Family History  Problem Relation Age of Onset  . Heart failure Brother   . Heart disease Father   . Throat cancer Sister   . Emphysema Father    SOCIAL HISTORY:  reports that she quit smoking about 22 months ago. Her smoking use included Cigarettes. She has a 25 pack-year smoking history. She has never used smokeless tobacco. She reports that she does not drink alcohol or use illicit drugs.  REVIEW OF SYSTEMS:  As per HPI  INTERVAL HISTORY:  VITAL SIGNS: Temp:  [98.3 F (36.8 C)] 98.3 F (36.8 C) (08/12 2150) Pulse Rate:  [108-118] 110 (08/12 2330) Resp:  [18-26] 22 (08/12 2330) BP: (111-135)/(61-76) 129/76 mmHg (08/12 2330) SpO2:  [68 %-100 %] 96 % (08/12 2330)  HEMODYNAMICS:   VENTILATOR SETTINGS:   INTAKE / OUTPUT: Intake/Output   None    PHYSICAL EXAMINATION: General:  Appears acutely ill Neuro:  Awake, alert, oriented, nonfocal HEENT:  Dry membranes, PERRL Cardiovascular:  RRR, systolic murmur left parasternal border Lungs:  Bilateral diminished air entry, coarse rhonchi, no rales Abdomen:  Soft, nontender, bowel sounds diminished Musculoskeletal:  Moves all extremities, no edema Skin:  Intact  LABS:  Recent Labs Lab  11/25/12 2230 11/25/12 2231  HGB 10.0*  --   WBC 6.7  --   PLT 247  --   NA 141  --   K 3.7  --   CL 108  --   CO2 22  --   GLUCOSE 113*  --   BUN 14  --   CREATININE 1.28*  --   CALCIUM 8.9  --   AST 26  --   ALT 18  --   ALKPHOS 85  --   BILITOT 0.5  --   PROT 6.9  --   ALBUMIN 3.6  --   TROPONINI  --  <0.30  PROBNP  --  5459.0*   No results found for this basename: GLUCAP,  in the last 168 hours  CXR:  8/12 >>> PICC in good position, pulmonary vascular congestion, no overt airspace disease, no overt airspace disease.  ASSESSMENT / PLAN:  Acute on chronic hypoxemic respiratory failure Chronic diastolic CHF, with possible exacerbation Acute exacerbation of COPD Chronic pulmonary hypertension PE is possible but unlikely Chronic renal failure HTN Hypothyroidism GERD  -->  Admit to ICU -->  Continue preadmission Rx -->  Hold more aggressive diuresis as clinically dry -->  Hesitant to CTA as impared renal function -->  TTE and venous Doppler BL LE -->  Bronchodilators -->  Solu-Medrol -->  Levofloxacin -->  Supplemental oxygen for goal SpO2 88-90 -->  May need NIMV -->  Consult CHF service in AM -->  Discuss Acetylcysteine PO with Dr. Delton Coombes in AM (was prescribed, never took, indication is unclear) -->  Heparin for DVT Px  I have personally obtained a history, examined the patient, evaluated laboratory and imaging results, formulated the assessment and plan and placed orders.  CRITICAL CARE:  The patient is critically ill with multiple organ systems failure and requires high complexity decision making for assessment and support, frequent evaluation and titration of therapies, application of advanced monitoring technologies and extensive interpretation of multiple databases. Critical Care Time devoted to patient care services described in this note is 45 minutes.   Lonia Farber, MD Pulmonary and Critical Care Medicine [facility] Pager: [phone]  11/26/2012, 12:08 AM

## 2012-11-26 NOTE — Progress Notes (Signed)
Utilization review completed.  P.J. Trevis Eden,RN,BSN Case Manager [phone]  

## 2012-11-26 NOTE — H&P (Signed)
PULMONARY  / CRITICAL CARE MEDICINE  Name: Kelly Hart MRN: [id number] DOB: 06-26-1932    ADMISSION DATE:  11/25/2012 CONSULTATION DATE:  11/25/2012  REFERRING MD :  EDP PRIMARY SERVICE:  PCCM  CHIEF COMPLAINT:  Acute respiratory failure  BRIEF PATIENT DESCRIPTION: 79 yo with past medical history of COPD (Dr. Delton Coombes), CHF (Dr. Gala Romney) and pulmonary hypertesnion on Milrinone and oxygen admitted for acute on chronic hypoxemic respiratory failure.  SIGNIFICANT EVENTS / STUDIES:  8/13 doppler>>> 8/13 echo>>> 8/13- hypoxia, distress  LINES / TUBES: HOME PICC>>>  CULTURES:  ANTIBIOTICS: Levaquin 8/12 >>>  INTERVAL HISTORY: Hypoxia to 1oo% NRB  VITAL SIGNS: Temp:  [97.6 F (36.4 C)-98.3 F (36.8 C)] 97.8 F (36.6 C) (08/13 0829) Pulse Rate:  [91-118] 101 (08/13 0900) Resp:  [14-29] 24 (08/13 0900) BP: (111-142)/(61-109) 128/86 mmHg (08/13 0900) SpO2:  [68 %-100 %] 94 % (08/13 0900) FiO2 (%):  [50 %] 50 % (08/13 0900) Weight:  [48.8 kg (107 lb 9.4 oz)-48.9 kg (107 lb 12.9 oz)] 48.9 kg (107 lb 12.9 oz) (08/13 0200)  HEMODYNAMICS: CVP:  [13 mmHg] 13 mmHg VENTILATOR SETTINGS: Vent Mode:  [-]  FiO2 (%):  [50 %] 50 % INTAKE / OUTPUT: Intake/Output     08/12 0701 - 08/13 0700 08/13 0701 - 08/14 0700   I.V. (mL/kg) 18.1 (0.4) 9.4 (0.2)   Total Intake(mL/kg) 18.1 (0.4) 9.4 (0.2)   Urine (mL/kg/hr) 100    Total Output 100     Net -81.9 +9.4         PHYSICAL EXAMINATION: General:  Appears acutely ill, mild distress worsen with exertion Neuro:  Awake, alert, oriented, nonfocal HEENT:  PERRL, limited jvd Cardiovascular:  RRR, systolic murmur left parasternal border Lungs:  reduced Abdomen:  Soft, nontender, bowel sounds diminished Musculoskeletal:  Moves all extremities, no edema Skin:  Intact  LABS:  Recent Labs Lab 11/25/12 2230 11/25/12 2231 11/26/12 0444  HGB 10.0*  --  9.3*  WBC 6.7  --  4.5  PLT 247  --  222  NA 141  --  139  K 3.7  --  3.6  CL 108   --  106  CO2 22  --  22  GLUCOSE 113*  --  144*  BUN 14  --  12  CREATININE 1.28*  --  1.14*  CALCIUM 8.9  --  8.7  AST 26  --   --   ALT 18  --   --   ALKPHOS 85  --   --   BILITOT 0.5  --   --   PROT 6.9  --   --   ALBUMIN 3.6  --   --   TROPONINI  --  <0.30  --   PROBNP  --  5459.0*  --     Recent Labs Lab 11/26/12 0110  GLUCAP 132*    CXR:  8/12 >>> PICC in good position, pulmonary vascular congestion, no overt airspace disease, no overt airspace disease.  ASSESSMENT / PLAN:  Acute on chronic hypoxemic respiratory failure Chronic diastolic CHF, with possible exacerbation Acute exacerbation of COPD Chronic pulmonary hypertension R/o PE Chronic renal failure HTN Hypothyroidism GERD  -->  Even if doppler legs neg, still risk in situ pe = do CT chest, but allergy listed -->consider VQ -->  TTE and venous Doppler BL LE - pending -->  Bronchodilators -->  Solu-Medrol, slight redcution -->  Levofloxacin empiric for now, may consider 5 days -->  Supplemental oxygen for goal  SpO2 88-90 -->  May need NIMV, consider 4 hrs on 2-4 hours off x 24 hrs as we use lasix -->  Consult CHF , agree likely lasix is main stay of treatment to improve -->  Heparin for DVT Px --> avoid hypoxia with pa htn -->flolane consideration , agree, follow clinical course and echo pa pressures today prior  I have personally obtained a history, examined the patient, evaluated laboratory and imaging results, formulated the assessment and plan and placed orders.  CRITICAL CARE:  The patient is critically ill with multiple organ systems failure and requires high complexity decision making for assessment and support, frequent evaluation and titration of therapies, application of advanced monitoring technologies and extensive interpretation of multiple databases. Critical Care Time devoted to patient care services described in this note is 30 minutes.   Nelda Bucks., MD Pulmonary and Critical  Care Medicine [facility] Pager: [phone]  11/26/2012, 11:32 AM

## 2012-11-26 NOTE — Care Management Note (Addendum)
Page 1 of 2   12/09/2012     10:03:34 AM   CARE MANAGEMENT NOTE 12/09/2012  Patient:  Kelly Hart, Kelly Hart   Account Number:  [id number]  Date Initiated:  11/26/2012  Documentation initiated by:  [name]  Subjective/Objective Assessment:   Admitted with progressive SOB - on 5 liters [place] oxygen at home and milrinone drip.     Action/Plan:   Anticipated DC Date:  12/10/2012   Anticipated DC Plan:  HOME W HOSPICE CARE  In-house referral  Clinical Social Worker  Hospice / Palliative Care      DC Planning Services  CM consult      [facility] Choice  HOSPICE   Choice offered to / List presented to:  [name]   DME arranged  OXYGEN  HOSPITAL BED        HH arranged  HH-1 RN  HH-10 DISEASE MANAGEMENT  HH-6 SOCIAL WORKER      HH agency  OTHER - SEE NOTE   Status of service:  Completed, signed off Medicare Important Message given?   (If response is "NO", the following Medicare IM given date fields will be blank) Date Medicare IM given:   Date Additional Medicare IM given:    Discharge Disposition:  HOME W HOSPICE CARE  Per UR Regulation:  Reviewed for med. necessity/level of care/duration of stay  If discussed at Long Length of Stay Meetings, dates discussed:   12/02/2012  12/04/2012  12/09/2012    Comments:  Contact:  Kelly Hart,Kelly Hart Daughter [phone]                 [facility] Daughter [phone]                  [facility] Daughter [phone]                  [facility] Daughter [phone]  8/26 0959 Kelly Hart Kelly Weinheimer rn,bsn pt and da have chosen [facility] for home hospice and would like [facility] for o2 and hosp bed. orders [facility]ed to [facility]. they will arrange eq. da Kelly Hart will recieve eq [phone]. [facility] [phone] and fax [phone]. have made sw ref for amb transport to [facility] on 8/27, out of facility dnr has been signed. pt off milrinone drip since 8/23.  8/22 1341p Kelly Hart Kelly Crescenzo rn,bsn spoke w Kelly  Hart of [name] they do not take milrinone but do have transition program that is not billed to any ins but they follow pt until they choose full hospice. this may be option if she goes home w [name] and [facility] will follow and when pt ready they will admit when she stops milrinone. spoke w pt and fam. they are deciding on what they would like to do. pt wants to go home w milrinone and also md may try to wean prior to disch. cm will cont to follow and assist as pt progresses and assist as needed.  8/21 1552 Kelly Hart Kelly Clum rn,bsn recieved ref for hospice in [place]. ? if hospice in [place] will take pt w home milrinone. have spoke w Kelly Hart home heat[facility] who referrd me to [facility] in [place] that covers Kelly Hart. have call into them to see if they take home milrinone. it may be am before they call per sec. no fam in room and pt sleeping on my visit. will cont to follow. call back from [facility] and they do not take milrinone drips. have cal into  [facility] to see if they will take ilrinone drip.  8/20 0944 Kelly Hart Kelly Meloy rn,bsn per cm sec no copay for tyvaso. spoke w cm carolyn at [facility] and [facility] is tier 5 drug. pt has cm for home w [facility] her name is Kelly Hart [phone]. md states to get pal care consult today. will cont to follow.  8/19 1121a Kelly Hart Kelly Sessler rn,bsn per cm sec pt in catsrophic phase w ins. tyvaso has no copay but needs prior auth at [phone].  11-26-12 11am Kelly Hart, RNBSN [phone] Family and patient interested on POA.  Patient awake, alert - per nurse not on any pain meds at this time.  SW called - with referral.  Consult placed.Act w [facility] home health for iv milrinone, [facility] for supply of milrinone.  [facility] home health [phone], fax [phone]

## 2012-11-26 NOTE — Progress Notes (Signed)
Pt placed on 50% venturi mask due to low sat's. Pt sat's increased now 97%.

## 2012-11-27 ENCOUNTER — Encounter (HOSPITAL_COMMUNITY): Payer: Medicare Other

## 2012-11-27 ENCOUNTER — Inpatient Hospital Stay (HOSPITAL_COMMUNITY): Payer: Medicare Other

## 2012-11-27 ENCOUNTER — Encounter (HOSPITAL_COMMUNITY)
Admission: EM | Disposition: A | Discharge status: Hospice | Payer: Self-pay | Source: Home / Self Care | Attending: Internal Medicine

## 2012-11-27 DIAGNOSIS — I279 Pulmonary heart disease, unspecified: Secondary | ICD-10-CM

## 2012-11-27 DIAGNOSIS — N179 Acute kidney failure, unspecified: Secondary | ICD-10-CM

## 2012-11-27 HISTORY — PX: RIGHT HEART CATHETERIZATION: SHX5447

## 2012-11-27 LAB — COMPREHENSIVE METABOLIC PANEL
AST: 20 U/L (ref 0–37)
Albumin: 3.2 g/dL — ABNORMAL LOW (ref 3.5–5.2)
BUN: 24 mg/dL — ABNORMAL HIGH (ref 6–23)
Calcium: 8.9 mg/dL (ref 8.4–10.5)
Chloride: 106 mEq/L (ref 96–112)
Creatinine, Ser: 2.04 mg/dL — ABNORMAL HIGH (ref 0.50–1.10)
Total Bilirubin: 0.4 mg/dL (ref 0.3–1.2)

## 2012-11-27 LAB — CBC WITH DIFFERENTIAL/PLATELET
Eosinophils Relative: 0 % (ref 0–5)
Hemoglobin: 8.3 g/dL — ABNORMAL LOW (ref 12.0–15.0)
Lymphocytes Relative: 5 % — ABNORMAL LOW (ref 12–46)
Lymphs Abs: 0.3 10*3/uL — ABNORMAL LOW (ref 0.7–4.0)
MCV: 83.3 fL (ref 78.0–100.0)
Monocytes Relative: 5 % (ref 3–12)
Neutrophils Relative %: 89 % — ABNORMAL HIGH (ref 43–77)
Platelets: 225 10*3/uL (ref 150–400)
RBC: 3.11 MIL/uL — ABNORMAL LOW (ref 3.87–5.11)
WBC: 5.1 10*3/uL (ref 4.0–10.5)

## 2012-11-27 LAB — POCT I-STAT 3, VENOUS BLOOD GAS (G3P V)
Acid-base deficit: 5 mmol/L — ABNORMAL HIGH (ref 0.0–2.0)
Acid-base deficit: 5 mmol/L — ABNORMAL HIGH (ref 0.0–2.0)
Acid-base deficit: 3 mmol/L — ABNORMAL HIGH (ref 0.0–2.0)
Bicarbonate: 19.3 mEq/L — ABNORMAL LOW (ref 20.0–24.0)
O2 Saturation: 69 %
O2 Saturation: 70 %
TCO2: 20 mmol/L (ref 0–100)
TCO2: 20 mmol/L (ref 0–100)
pCO2, Ven: 32.4 mmHg — ABNORMAL LOW (ref 45.0–50.0)
pH, Ven: 7.418 — ABNORMAL HIGH (ref 7.250–7.300)
pO2, Ven: 36 mmHg (ref 30.0–45.0)
pO2, Ven: 31 mmHg (ref 30.0–45.0)

## 2012-11-27 LAB — CREATININE, SERUM
Creatinine, Ser: 1.7 mg/dL — ABNORMAL HIGH (ref 0.50–1.10)
GFR calc Af Amer: 32 mL/min — ABNORMAL LOW (ref >90)
GFR calc non Af Amer: 28 mL/min — ABNORMAL LOW (ref >90)

## 2012-11-27 LAB — POCT I-STAT 3, ART BLOOD GAS (G3+)
Acid-base deficit: 4 mmol/L — ABNORMAL HIGH (ref 0.0–2.0)
Bicarbonate: 18.8 mEq/L — ABNORMAL LOW (ref 20.0–24.0)
TCO2: 20 mmol/L (ref 0–100)

## 2012-11-27 LAB — CARBOXYHEMOGLOBIN
Carboxyhemoglobin: 1.5 % (ref 0.5–1.5)
Methemoglobin: 0.8 % (ref 0.0–1.5)
O2 Saturation: 63.2 %
Total hemoglobin: 8.4 g/dL — ABNORMAL LOW (ref 12.0–16.0)

## 2012-11-27 LAB — CBC
HCT: 27.5 % — ABNORMAL LOW (ref 36.0–46.0)
MCHC: 32.7 g/dL (ref 30.0–36.0)
Platelets: 254 10*3/uL (ref 150–400)
RBC: 3.32 MIL/uL — ABNORMAL LOW (ref 3.87–5.11)
RDW: 16.7 % — ABNORMAL HIGH (ref 11.5–15.5)

## 2012-11-27 LAB — MAGNESIUM: Magnesium: 2 mg/dL (ref 1.5–2.5)

## 2012-11-27 SURGERY — RIGHT HEART CATH
Anesthesia: LOCAL

## 2012-11-27 MED ORDER — SODIUM CHLORIDE 0.9 % IJ SOLN: 3.0000 mL | Freq: Two times a day (BID) | INTRAMUSCULAR | Status: DC

## 2012-11-27 MED ORDER — HEPARIN SODIUM (PORCINE) 1000 UNIT/ML IJ SOLN
INTRAMUSCULAR | Status: AC
  Filled 2012-11-27: qty 1

## 2012-11-27 MED ORDER — BIOTENE DRY MOUTH MT LIQD
15.0000 mL | Freq: Two times a day (BID) | OROMUCOSAL | Status: DC
  Administered 2012-11-27 – 2012-12-10 (×23): 15 mL via OROMUCOSAL

## 2012-11-27 MED ORDER — METHYLPREDNISOLONE SODIUM SUCC 40 MG IJ SOLR
20.0000 mg | Freq: Two times a day (BID) | INTRAMUSCULAR | Status: DC
  Administered 2012-11-27 – 2012-11-28 (×3): 20 mg via INTRAVENOUS
  Filled 2012-11-27 (×4): qty 0.5

## 2012-11-27 MED ORDER — SODIUM CHLORIDE 0.9 % IJ SOLN
3.0000 mL | Freq: Two times a day (BID) | INTRAMUSCULAR | Status: DC
  Administered 2012-11-27: 3 mL via INTRAVENOUS

## 2012-11-27 MED ORDER — ACETAMINOPHEN 325 MG PO TABS
650.0000 mg | ORAL_TABLET | ORAL | Status: DC | PRN
  Administered 2012-12-07: 650 mg via ORAL
  Filled 2012-11-27: qty 2

## 2012-11-27 MED ORDER — SODIUM CHLORIDE 0.9 % IJ SOLN
3.0000 mL | Freq: Two times a day (BID) | INTRAMUSCULAR | Status: DC
  Administered 2012-11-27 – 2012-11-30 (×5): 3 mL via INTRAVENOUS

## 2012-11-27 MED ORDER — SODIUM CHLORIDE 0.9 % IJ SOLN: 3.0000 mL | INTRAMUSCULAR | Status: DC | PRN

## 2012-11-27 MED ORDER — VERAPAMIL HCL 2.5 MG/ML IV SOLN
INTRAVENOUS | Status: AC
  Filled 2012-11-27: qty 2

## 2012-11-27 MED ORDER — LIDOCAINE HCL (PF) 1 % IJ SOLN
INTRAMUSCULAR | Status: AC
  Filled 2012-11-27: qty 30

## 2012-11-27 MED ORDER — SODIUM CHLORIDE 0.9 % IV SOLN: 250.0000 mL | INTRAVENOUS | Status: DC | PRN

## 2012-11-27 MED ORDER — HEPARIN SODIUM (PORCINE) 1000 UNIT/ML IJ SOLN
5000.0000 [IU] | Freq: Once | INTRAMUSCULAR | Status: DC
  Filled 2012-11-27: qty 5

## 2012-11-27 MED ORDER — ONDANSETRON HCL 4 MG/2ML IJ SOLN: 4.0000 mg | Freq: Four times a day (QID) | INTRAMUSCULAR | Status: DC | PRN

## 2012-11-27 MED ORDER — HEPARIN SODIUM (PORCINE) 5000 UNIT/ML IJ SOLN
5000.0000 [IU] | Freq: Three times a day (TID) | INTRAMUSCULAR | Status: DC
  Administered 2012-11-27 – 2012-12-04 (×21): 5000 [IU] via SUBCUTANEOUS
  Filled 2012-11-27 (×23): qty 1

## 2012-11-27 MED ORDER — HEPARIN (PORCINE) IN NACL 2-0.9 UNIT/ML-% IJ SOLN
INTRAMUSCULAR | Status: AC
  Filled 2012-11-27: qty 500

## 2012-11-27 NOTE — Progress Notes (Signed)
Dr. Gala Romney relayed that Kelly Hart catheter is curled in the right atrium.  Order received to remove swan ganz catheter.  Will continue to monitor pt closely.

## 2012-11-27 NOTE — Progress Notes (Signed)
Theone Murdoch catheter removed without incident.  Cordes and sheath intact.

## 2012-11-27 NOTE — CV Procedure (Signed)
Cardiac Cath Procedure Note:  Indication:  PAH  Procedures performed:  1) Right heart catheterization 2) Left heart catheterization  Description of procedure:   A 7 FR venous sheath was placed in the right internal jugular vein using a modified Seldinger technique. A standard Swan-Ganz catheter was used for the procedure.   The risks and indication of the procedure were explained. Consent was signed and placed on the chart. An appropriate timeout was taken prior to the procedure. After a normal Allen's test was confirmed, the right wrist was prepped and draped in the routine sterile fashion and anesthetized with 1% local lidocaine.   A 5 FR arterial sheath was then placed in the right radial artery using a modified Seldinger technique. Systemic heparin was administered. 3mg  IV verapamil was given through the sheath. A standard pigtail catheter was used to measure the LV pressure.   Complications: None apparent.  Findings:  Done on milrinone 0.375 mcg/kg/min  RA = 13 RV = 74/0/15 PA =  88/28 (47) R lower PCW = 27 R upper PCW = 20 L upper PCW = 12 Fick cardiac output/index = 5.6/3.7 PVR = 6.3 Woods Ao sat = 94% PA sat = 69%, 70% SVC sat = 62%  Ao Pressure: 121/60 (83) LV Pressure: 131/0/13 There was no signficant gradient across the aortic valve on pullback.  Assessment:  1. Severe pulmonary HTN 2. Normal LV filling pressure 3. Differential PCW pressure R>L 4. Normal cardiac output  Plan/Discussion:  Very difficult situation. She has severe PAH despite ongoing milrinone support. Her left-sided pressures are normal despite the elevated R wedge measurement. I suspect she may have pulmonary venous disease on the right. Will check VQ.  Given severe PAH ongoing hypoxemia will need to discuss with [facility] to see if she is candidate for Flolan.   Tashawn Greff 2:34 PM

## 2012-11-27 NOTE — Progress Notes (Addendum)
Advanced Heart Failure Team Consult Note   Primary Cardiologist:  DB  Reason for Consultation: Respiratory failure  HPI:    Kelly Hart is a 79 year old AA female with of pulseless electrical activity arrest, COPD, right heart failure secondary to cor pulmonale, pulmonary arterial hypertension (multifactorial but COPD plays a role), chronic respiratory failure, multifocal atrial tachycardia, peripheral arterial disease status post aortobifem bypass.  RHC 01/09/12 on milrinone  RA = 4  RV = 56/1/5  PA = 66/24 (45)  LPCW = 13  RPCW = 11  Fick cardiac output/index = 5.6/3.8  PVR = 8.0 Woods  FA sat = 90%  PA sat = 69%, 70%  SVC sat = 69%   Echo 01/08/12: LVEF 60-70%. Ventricular septum showed septal bounce. RV mod-severely dilated. Atrial septum bowed right to left. PASP 82 mmHG.  Echo (2/14): EF 55-60%, RV dilated with mild to moderate systolic dysfunction, D-shaped septum, PA systolic pressure 83 mmHg, severe TR.   Still requiring high flow O2 with borderline oxygenation. Sats dropped to 75% with just turning. Yesterday milrinone increased and we gave her one dose IV lasix.   Feels ok this am. CVP 12. Co-ox 46%->63%    Objective:    Vital Signs:   Temp:  [97.4 F (36.3 C)-98.1 F (36.7 C)] 98.1 F (36.7 C) (08/14 0411) Pulse Rate:  [32-124] 94 (08/14 0700) Resp:  [14-36] 19 (08/14 0700) BP: (91-142)/(51-91) 97/56 mmHg (08/14 0700) SpO2:  [89 %-100 %] 99 % (08/14 0700) FiO2 (%):  [50 %] 50 % (08/14 0400) Weight:  [50.5 kg (111 lb 5.3 oz)] 50.5 kg (111 lb 5.3 oz) (08/14 0400) Last BM Date: 11/25/12  Weight change: Filed Weights   11/26/12 0017 11/26/12 0200 11/27/12 0400  Weight: 48.8 kg (107 lb 9.4 oz) 48.9 kg (107 lb 12.9 oz) 50.5 kg (111 lb 5.3 oz)    Intake/Output:   Intake/Output Summary (Last 24 hours) at 11/27/12 0751 Last data filed at 11/27/12 0700  Gross per 24 hour  Intake 130.35 ml  Output    725 ml  Net -594.65 ml     Physical Exam: General: Thin.  Frail Wearing face mask. Talks in full sentences, HEENT: normal  Neck: supple. JVP to jaw Carotids 2+ bilaterally; no bruits. No lymphadenopathy or thryomegaly appreciated.  Cor: PMI normal.Tachycardic, regular rhythm. Loud TR murmur  +prominent s2. +RV lift  Lungs: Decreased breath sounds throughout, occasional rhonchi, no wheezes  Abdomen: soft, nontender, nondistended. No hepatosplenomegaly. No bruits or masses. Good bowel sounds.  Extremities: no cyanosis, clubbing, rash, edema. RUE PICC  Neuro: alert & orientedx3, cranial nerves grossly intact. Moves all 4 extremities w/o difficulty. Affect pleasant.   Telemetry: Sinus tach  Labs: Basic Metabolic Panel:  Recent Labs Lab 11/25/12 2230 11/26/12 0444 11/27/12 0425  NA 141 139 139  K 3.7 3.6 4.0  CL 108 106 106  CO2 22 22 21   GLUCOSE 113* 144* 146*  BUN 14 12 24*  CREATININE 1.28* 1.14* 2.04*  CALCIUM 8.9 8.7 8.9  MG  --   --  2.0    Liver Function Tests:  Recent Labs Lab 11/25/12 2230 11/27/12 0425  AST 26 20  ALT 18 15  ALKPHOS 85 73  BILITOT 0.5 0.4  PROT 6.9 6.0  ALBUMIN 3.6 3.2*   No results found for this basename: LIPASE, AMYLASE,  in the last 168 hours No results found for this basename: AMMONIA,  in the last 168 hours  CBC:  Recent Labs Lab  11/25/12 2230 11/26/12 0444 11/27/12 0425  WBC 6.7 4.5 5.1  NEUTROABS 5.4  --  4.6  HGB 10.0* 9.3* 8.3*  HCT 31.7* 29.2* 25.9*  MCV 84.5 84.4 83.3  PLT 247 222 225    Cardiac Enzymes:  Recent Labs Lab 11/25/12 2231  TROPONINI <0.30    BNP: BNP (last 3 results)  Recent Labs  01/07/12 2229 10/28/12 1418 11/25/12 2231  PROBNP 1049.0* 3408.0* 5459.0*    CBG:  Recent Labs Lab 11/26/12 0110  GLUCAP 132*    Coagulation Studies: No results found for this basename: LABPROT, INR,  in the last 72 hours  Other results:  Imaging: Dg Chest Port 1 View  11/27/2012   *RADIOLOGY REPORT*  Clinical Data: Congestion.  Short of breath.  Evaluate  endotracheal tube.  PORTABLE CHEST - 1 VIEW  Comparison: 11/25/2012.  Findings: There is no endotracheal tube present.  Right upper extremity PICC in the mid-to-lower SVC is unchanged.  Diffuse interstitial pattern also appears similar.  Cardiomegaly.  Tortuous thoracic aorta with arch atherosclerosis. Aortic branch vessel atherosclerosis also appears similar.  IMPRESSION: No interval change.  Cardiomegaly and diffuse interstitial pattern likely representing interstitial pulmonary edema.   Original Report Authenticated By: Andreas Newport, [name]   Dg Chest Port 1 View  11/25/2012   *RADIOLOGY REPORT*  Clinical Data: Off and shortness of breath.  PORTABLE CHEST - 1 VIEW  Comparison: Chest x-ray 10/18/2012.  Findings: Right upper extremity PICC with tip terminating in the distal superior vena cava. There is cephalization of the pulmonary vasculature and slight indistinctness of the interstitial markings suggestive of mild pulmonary edema.  Mild cardiomegaly.  Small right pleural effusion.  Extensive atherosclerosis in the thoracic aorta and great vessels.  IMPRESSION: 1.  Findings, as above, concerning for mild congestive heart failure. 2.  Extensive atherosclerosis.   Original Report Authenticated By: Trudie Reed, [name]     Medications:     Current Medications: . ipratropium  0.5 mg Nebulization Q4H   And  . albuterol  2.5 mg Nebulization Q4H  . amLODipine  5 mg Oral Daily  . aspirin EC  81 mg Oral Daily  . atorvastatin  40 mg Oral QHS  . budesonide-formoterol  2 puff Inhalation BID  . digoxin  62.5 mcg Oral Daily  . heparin subcutaneous  5,000 Units Subcutaneous Q8H  . levofloxacin (LEVAQUIN) IV  500 mg Intravenous Q48H  . levothyroxine  125 mcg Oral QAC breakfast  . magnesium oxide  400 mg Oral Daily  . methylPREDNISolone (SOLU-MEDROL) injection  80 mg Intravenous Q12H  . multivitamin with minerals  1 tablet Oral Daily  . pantoprazole  40 mg Oral Daily  . Tadalafil (PAH)  20 mg Oral Daily     Infusions: . milrinone 0.375 mcg/kg/min (11/26/12 1537)     Assessment:   1. Acute on chronic respiratory failure 2. Cor pulmonale, acute on chronic 3. Acute on chronic diastolic HF. 4. Severe COPD 5. Mixed PAH 6. PAH 7. Acute renal failure  Plan/Discussion:    I suspect she now has end-stage PAH/RHF with severe hypoxemia. Milrinone increased yesterday with improvement in her co-ox but no improvement in saturations. Will plan to take her to cath lab today for swan placement. May need to consider Flolan. Previously unable to tolerate titration of AdCirca.  We considered PE but I think that she wouldn't have survived an acute PE. I favor progression of her existing disease. Can consider VQ if not improving. Amio toxicity also a possibility sand  amio stopped. CT chest in 2012 with diffuse interstitial pattern. May need to repeat. Check ESR. Hold lasix given renal failure.  Can wean steroids, etc as I don think think this is a parenchymal or airway issue. D/w. Dr. Tyson Alias.  The patient is critically ill with multiple organ systems failure and requires high complexity decision making for assessment and support, frequent evaluation and titration of therapies, application of advanced monitoring technologies and extensive interpretation of multiple databases.   Critical Care Time devoted to patient care services described in this note is 35 Minutes.   Length of Stay: 2  Keenya Matera,MD 7:51 AM

## 2012-11-27 NOTE — H&P (Addendum)
PULMONARY  / CRITICAL CARE MEDICINE  Name: Kelly Hart MRN: [id number] DOB: Apr 19, 1932    ADMISSION DATE:  11/25/2012 CONSULTATION DATE:  11/25/2012  REFERRING MD :  EDP PRIMARY SERVICE:  PCCM  CHIEF COMPLAINT:  Acute respiratory failure  BRIEF PATIENT DESCRIPTION: 79 yo with past medical history of COPD (Dr. Delton Coombes), CHF (Dr. Gala Romney) and pulmonary hypertesnion on Milrinone and oxygen admitted for acute on chronic hypoxemic respiratory failure.  SIGNIFICANT EVENTS / STUDIES:  8/13 doppler>>> 8/13 echo>>> 8/13- hypoxia, distress 8/14- no NIMV, bump crt with lasix  LINES / TUBES: HOME PICC>>>  CULTURES:  ANTIBIOTICS: Levaquin 8/12 >>>8/14  INTERVAL HISTORY: Requires 50% fio2  VITAL SIGNS: Temp:  [97.4 F (36.3 C)-98.1 F (36.7 C)] 98.1 F (36.7 C) (08/14 0808) Pulse Rate:  [32-124] 108 (08/14 0800) Resp:  [14-36] 18 (08/14 0800) BP: (91-132)/(51-91) 106/65 mmHg (08/14 0800) SpO2:  [89 %-100 %] 94 % (08/14 0800) FiO2 (%):  [50 %] 50 % (08/14 0800) Weight:  [50.5 kg (111 lb 5.3 oz)] 50.5 kg (111 lb 5.3 oz) (08/14 0400)  HEMODYNAMICS: CVP:  [9 mmHg-16 mmHg] 12 mmHg VENTILATOR SETTINGS: Vent Mode:  [-]  FiO2 (%):  [50 %] 50 % INTAKE / OUTPUT: Intake/Output     08/13 0701 - 08/14 0700 08/14 0701 - 08/15 0700   I.V. (mL/kg) 130.4 (2.6) 5.5 (0.1)   Total Intake(mL/kg) 130.4 (2.6) 5.5 (0.1)   Urine (mL/kg/hr) 725 (0.6)    Total Output 725     Net -594.7 +5.5         PHYSICAL EXAMINATION: General:  In bed, no distress at rest Neuro:  Awake, alert, oriented, nonfocal HEENT:  PERRL Cardiovascular:  RRR, systolic murmur left parasternal border Lungs:  Reduced but no active bronchospasm Abdomen:  Soft, nontender, bowel sounds diminished Musculoskeletal:  Moves all extremities, no edema Skin:  Intact  LABS:  Recent Labs Lab 11/25/12 2230 11/25/12 2231 11/26/12 0444 11/27/12 0425  HGB 10.0*  --  9.3* 8.3*  WBC 6.7  --  4.5 5.1  PLT 247  --  222 225  NA  141  --  139 139  K 3.7  --  3.6 4.0  CL 108  --  106 106  CO2 22  --  22 21  GLUCOSE 113*  --  144* 146*  BUN 14  --  12 24*  CREATININE 1.28*  --  1.14* 2.04*  CALCIUM 8.9  --  8.7 8.9  MG  --   --   --  2.0  AST 26  --   --  20  ALT 18  --   --  15  ALKPHOS 85  --   --  73  BILITOT 0.5  --   --  0.4  PROT 6.9  --   --  6.0  ALBUMIN 3.6  --   --  3.2*  TROPONINI  --  <0.30  --   --   PROBNP  --  5459.0*  --   --     Recent Labs Lab 11/26/12 0110  GLUCAP 132*    CXR:  8/14- chronic int changes, line wnl, no infiltrate  ASSESSMENT / PLAN:  Acute on chronic hypoxemic respiratory failure Chronic diastolic CHF, with possible exacerbation Acute exacerbation of COPD- unimpressed Chronic pulmonary hypertension R/o PE (d dimer up but not impressive, doppler neg, clinical suspicion low) Chronic renal failure HTN Hypothyroidism GERD  -->  Even if doppler legs neg, still risk in situ pe =  do CT chest, but allergy listed -->consider VQ if status remains on 50-% without other noted cause -->  Bronchodilators -->  Solu-Medrol, to 20 q12h then rapid pred taper planned if clinical status remains same -->  Levofloxacin, dc no sign infection, at risk cdiff -->  Supplemental oxygen for goal SpO2 92% -->  Dc NIMV, not needed -->  Heparin for DVT Px --> avoid hypoxia with pa htn -->flolane consideration , for swan and then decide --> if in am crt up further, add maintenance fluids  To 2900 after Pa cath To chf service, will round in am as well  Nelda Bucks., MD Pulmonary and Critical Care Medicine [facility] Pager: [phone]  11/27/2012, 8:55 AM

## 2012-11-27 NOTE — Progress Notes (Signed)
Dr. Gala Romney notified that swan ganz waveform appears inaccurate.  Orders received.  Will continue to monitor pt closely.

## 2012-11-28 ENCOUNTER — Inpatient Hospital Stay (HOSPITAL_COMMUNITY): Payer: Medicare Other

## 2012-11-28 DIAGNOSIS — R0902 Hypoxemia: Secondary | ICD-10-CM

## 2012-11-28 DIAGNOSIS — I279 Pulmonary heart disease, unspecified: Secondary | ICD-10-CM

## 2012-11-28 LAB — POCT I-STAT 3, ART BLOOD GAS (G3+)
Acid-base deficit: 3 mmol/L — ABNORMAL HIGH (ref 0.0–2.0)
Bicarbonate: 19.1 mEq/L — ABNORMAL LOW (ref 20.0–24.0)
O2 Saturation: 72 %
Patient temperature: 98.6
TCO2: 20 mmol/L (ref 0–100)
pH, Arterial: 7.49 — ABNORMAL HIGH (ref 7.350–7.450)

## 2012-11-28 LAB — BASIC METABOLIC PANEL
BUN: 28 mg/dL — ABNORMAL HIGH (ref 6–23)
Calcium: 8.6 mg/dL (ref 8.4–10.5)
Creatinine, Ser: 1.56 mg/dL — ABNORMAL HIGH (ref 0.50–1.10)
GFR calc Af Amer: 36 mL/min — ABNORMAL LOW (ref >90)
GFR calc non Af Amer: 31 mL/min — ABNORMAL LOW (ref >90)

## 2012-11-28 LAB — CARBOXYHEMOGLOBIN
Methemoglobin: 0.5 % (ref 0.0–1.5)
Total hemoglobin: 8.2 g/dL — ABNORMAL LOW (ref 12.0–16.0)

## 2012-11-28 MED ORDER — TECHNETIUM TC 99M DIETHYLENETRIAME-PENTAACETIC ACID: 40.0000 | Freq: Once | INTRAVENOUS | Status: AC | PRN

## 2012-11-28 MED ORDER — TECHNETIUM TO 99M ALBUMIN AGGREGATED
6.0000 | Freq: Once | INTRAVENOUS | Status: AC | PRN
  Administered 2012-11-28: 6 via INTRAVENOUS

## 2012-11-28 MED ORDER — FENTANYL CITRATE 0.05 MG/ML IJ SOLN
12.5000 ug | Freq: Once | INTRAMUSCULAR | Status: AC
  Administered 2012-11-28: 12.5 ug via INTRAVENOUS
  Filled 2012-11-28: qty 2

## 2012-11-28 NOTE — Progress Notes (Signed)
  VQ scan no evidence of PE. There was equal perfusion to both lungs despite regional differences in PCWP.   She has been on NO all day and seems to be maintaining her sats a bit better. Now on 6L  but still desaturates with activity. In order to better characterize response to NO, I replaced her PA catheter and PA pressures have decreased dramatically with peak PA pressures now in 55-60 range.   We drew ABG off O2 and sats in high 60s low 70s.  Will keep swan in for 24-48 hours to continue to monitor response to NO. Will discuss with [facility] if she is candidate for Flolan or if we should try Tyvaso. I favor Flolan.  Kelly Hefel,MD 7:20 PM

## 2012-11-28 NOTE — Progress Notes (Signed)
PULMONARY  / CRITICAL CARE MEDICINE  Name: Kelly Hart MRN: [id number] DOB: January 16, 1933    ADMISSION DATE:  11/25/2012 CONSULTATION DATE:  11/25/2012  REFERRING MD :  EDP PRIMARY SERVICE:  PCCM  CHIEF COMPLAINT:  Acute respiratory failure  BRIEF PATIENT DESCRIPTION: 79 yo with past medical history of COPD (Dr. Delton Coombes), CHF (Dr. Gala Romney) and pulmonary hypertesnion on Milrinone and oxygen admitted for acute on chronic hypoxemic respiratory failure.  SIGNIFICANT EVENTS / STUDIES:  8/13 doppler>>> 8/13 echo>>> 8/13- hypoxia, distress 8/14- no NIMV, bump crt with lasix  LINES / TUBES: HOME PICC>>>  CULTURES:  ANTIBIOTICS: Levaquin 8/12 >>>8/14  INTERVAL HISTORY: Requires 50% fio2  VITAL SIGNS: Temp:  [97.5 F (36.4 C)-98.6 F (37 C)] 98.5 F (36.9 C) (08/15 0711) Pulse Rate:  [103-119] 111 (08/15 1055) Resp:  [15-32] 22 (08/15 1055) BP: (111-151)/(64-113) 149/90 mmHg (08/15 1055) SpO2:  [78 %-100 %] 78 % (08/15 1055) FiO2 (%):  [40 %-50 %] 50 % (08/15 1055) Weight:  [48.3 kg (106 lb 7.7 oz)] 48.3 kg (106 lb 7.7 oz) (08/15 0500)  HEMODYNAMICS: PAP: (78-85)/(2-16) 78/2 mmHg CVP:  [9 mmHg-15 mmHg] 9 mmHg VENTILATOR SETTINGS: Vent Mode:  [-]  FiO2 (%):  [40 %-50 %] 50 % INTAKE / OUTPUT: Intake/Output     08/14 0701 - 08/15 0700 08/15 0701 - 08/16 0700   P.O. 600    I.V. (mL/kg) 395.3 (8.2) 25.5 (0.5)   Total Intake(mL/kg) 995.3 (20.6) 25.5 (0.5)   Urine (mL/kg/hr) 530 (0.5) 85 (0.4)   Total Output 530 85   Net +465.3 -59.5         PHYSICAL EXAMINATION: General:  In bed, no distress at rest Neuro:  Awake, alert, oriented, nonfocal HEENT:  PERRL Cardiovascular:  RRR, systolic murmur left parasternal border Lungs:  Reduced but no active bronchospasm Abdomen:  Soft, nontender, bowel sounds diminished Musculoskeletal:  Moves all extremities, no edema Skin:  Intact  LABS:  Recent Labs Lab 11/25/12 2230 11/25/12 2231 11/26/12 0444 11/27/12 0425  11/27/12 0900 11/27/12 1434 11/27/12 1610 11/28/12 0410  HGB 10.0*  --  9.3* 8.3*  --   --  9.0*  --   WBC 6.7  --  4.5 5.1  --   --  7.8  --   PLT 247  --  222 225  --   --  254  --   NA 141  --  139 139  --   --   --  136  K 3.7  --  3.6 4.0  --   --   --  4.0  CL 108  --  106 106  --   --   --  104  CO2 22  --  22 21  --   --   --  22  GLUCOSE 113*  --  144* 146*  --   --   --  130*  BUN 14  --  12 24*  --   --   --  28*  CREATININE 1.28*  --  1.14* 2.04*  --   --  1.70* 1.56*  CALCIUM 8.9  --  8.7 8.9  --   --   --  8.6  MG  --   --   --  2.0  --   --   --   --   AST 26  --   --  20  --   --   --   --   ALT 18  --   --  15  --   --   --   --   ALKPHOS 85  --   --  73  --   --   --   --   BILITOT 0.5  --   --  0.4  --   --   --   --   PROT 6.9  --   --  6.0  --   --   --   --   ALBUMIN 3.6  --   --  3.2*  --   --   --   --   INR  --   --   --   --  1.29  --   --   --   TROPONINI  --  <0.30  --   --   --   --   --   --   PROBNP  --  5459.0*  --   --   --   --   --   --   PHART  --   --   --   --   --  7.453*  --   --   PCO2ART  --   --   --   --   --  26.9*  --   --   PO2ART  --   --   --   --   --  65.0*  --   --     Recent Labs Lab 11/26/12 0110  GLUCAP 132*    CXR:  8/14- chronic int changes, line wnl, no infiltrate  ASSESSMENT / PLAN:  Acute on chronic hypoxemic respiratory failure Chronic diastolic CHF, with possible exacerbation Acute exacerbation of COPD- unimpressed Chronic pulmonary hypertension R/o PE (d dimer up but not impressive, doppler neg, clinical suspicion low) Chronic renal failure HTN Hypothyroidism GERD  --> Even if doppler legs neg, still risk in situ pe, consider VQ if status remains on 50-% without other noted cause. --> Bronchodilators as ordered. --> Solu-Medrol, to 20 q12h then rapid pred taper planned if clinical status remains same. --> D/C abx. --> Supplemental oxygen for goal SpO2 92%. --> D/C BiPAP. --> Heparin for DVT  Px. --> Avoid hypoxia with pa htn --> Flolane consideration , for swan and then decide --> Cr is improving, will continue current fluid management.  PCCM will sign off, please call back if needed.  Koren Bound, MD Pulmonary and Critical Care Medicine [facility] Pager: [phone]  11/28/2012, 11:03 AM

## 2012-11-28 NOTE — Progress Notes (Signed)
Advanced Heart Failure Team Consult Note   Primary Cardiologist:  DB  Reason for Consultation: Respiratory failure  HPI:    Kelly Hart is a 79 year old AA female with of pulseless electrical activity arrest, COPD, right heart failure secondary to cor pulmonale, pulmonary arterial hypertension (multifactorial but COPD plays a role), chronic respiratory failure, multifocal atrial tachycardia, peripheral arterial disease status post aortobifem bypass.  RHC 11/28/12 Done on milrinone 0.375 mcg/kg/min  RA = 13  RV = 74/0/15  PA = 88/28 (47)  R lower PCW = 27  R upper PCW = 20  L upper PCW = 12  Fick cardiac output/index = 5.6/3.7  PVR = 6.3 Woods  Ao sat = 94%  PA sat = 69%, 70%  SVC sat = 62%  Echo 01/08/12: LVEF 60-70%. Ventricular septum showed septal bounce. RV mod-severely dilated. Atrial septum bowed right to left. PASP 82 mmHG.  Echo (2/14): EF 55-60%, RV dilated with mild to moderate systolic dysfunction, D-shaped septum, PA systolic pressure 83 mmHg, severe TR.   RHC yesterday PA pressures 90. Going for VQ scan today to r/o PE. Wt down 5 lbs. SBP 115-120s. Remains ST 110-115. Still on Venti mask 50% with O2 sats >95%. Complains of upper back pain under L shoulder blade. Cr improving 1.56  CT chest (non contrast): Cardiomegaly with prominent interstitial markings and bronchial  wall thickening, likely emphysema with superimposed acute or  chronic bronchitis.  Feels ok this am. CVP 12. Co-ox 46%->63%>66%  ESR 3   Objective:    Vital Signs:   Temp:  [97.5 F (36.4 C)-98.6 F (37 C)] 98.5 F (36.9 C) (08/15 0711) Pulse Rate:  [103-119] 110 (08/15 0711) Resp:  [14-32] 16 (08/15 0600) BP: (106-142)/(60-113) 114/79 mmHg (08/15 0700) SpO2:  [89 %-100 %] 97 % (08/15 0711) FiO2 (%):  [40 %-50 %] 50 % (08/15 0400) Weight:  [106 lb 7.7 oz (48.3 kg)] 106 lb 7.7 oz (48.3 kg) (08/15 0500) Last BM Date: 11/25/12  Weight change: Filed Weights   11/26/12 0200 11/27/12 0400 11/28/12  0500  Weight: 107 lb 12.9 oz (48.9 kg) 111 lb 5.3 oz (50.5 kg) 106 lb 7.7 oz (48.3 kg)    Intake/Output:   Intake/Output Summary (Last 24 hours) at 11/28/12 0715 Last data filed at 11/28/12 0600  Gross per 24 hour  Intake 969.83 ml  Output    530 ml  Net 439.83 ml     Physical Exam: General: Thin. Frail Wearing venti mask HEENT: normal  Neck: supple. JVP to 8-9 Carotids 2+ bilaterally; no bruits. No lymphadenopathy or thryomegaly appreciated; R cordis Cor: PMI normal.Tachycardic, regular rhythm. Loud TR murmur  +prominent s2. +RV lift  Lungs: Decreased breath sounds throughout, occasional rhonchi, no wheezes  Abdomen: soft, nontender, nondistended. No hepatosplenomegaly. No bruits or masses. Good bowel sounds.  Extremities: no cyanosis, clubbing, rash, edema. RUE PICC  Neuro: alert & orientedx3, cranial nerves grossly intact. Moves all 4 extremities w/o difficulty. Affect pleasant.   Telemetry: Sinus tach  Labs: Basic Metabolic Panel:  Recent Labs Lab 11/25/12 2230 11/26/12 0444 11/27/12 0425 11/27/12 1610 11/28/12 0410  NA 141 139 139  --  136  K 3.7 3.6 4.0  --  4.0  CL 108 106 106  --  104  CO2 22 22 21   --  22  GLUCOSE 113* 144* 146*  --  130*  BUN 14 12 24*  --  28*  CREATININE 1.28* 1.14* 2.04* 1.70* 1.56*  CALCIUM 8.9 8.7 8.9  --  8.6  MG  --   --  2.0  --   --     Liver Function Tests:  Recent Labs Lab 11/25/12 2230 11/27/12 0425  AST 26 20  ALT 18 15  ALKPHOS 85 73  BILITOT 0.5 0.4  PROT 6.9 6.0  ALBUMIN 3.6 3.2*   No results found for this basename: LIPASE, AMYLASE,  in the last 168 hours No results found for this basename: AMMONIA,  in the last 168 hours  CBC:  Recent Labs Lab 11/25/12 2230 11/26/12 0444 11/27/12 0425 11/27/12 1610  WBC 6.7 4.5 5.1 7.8  NEUTROABS 5.4  --  4.6  --   HGB 10.0* 9.3* 8.3* 9.0*  HCT 31.7* 29.2* 25.9* 27.5*  MCV 84.5 84.4 83.3 82.8  PLT 247 222 225 254    Cardiac Enzymes:  Recent Labs Lab  11/25/12 2231  TROPONINI <0.30    BNP: BNP (last 3 results)  Recent Labs  01/07/12 2229 10/28/12 1418 11/25/12 2231  PROBNP 1049.0* 3408.0* 5459.0*    CBG:  Recent Labs Lab 11/26/12 0110  GLUCAP 132*    Coagulation Studies:  Recent Labs  11/27/12 0900  LABPROT 15.8*  INR 1.29    Other results:  Imaging: Ct Chest Wo Contrast  11/28/2012   *RADIOLOGY REPORT*  Clinical Data: Shortness of breath  CT CHEST WITHOUT CONTRAST  Technique:  Multidetector CT imaging of the chest was performed following the standard protocol without IV contrast.  Comparison: Chest radiograph same date, chest CT 02/02/2011  Findings: Moderate to severe atheromatous aortic calcification without aneurysm.  Streak artifact from the patient's arm obscures detail.  Moderate cardiomegaly persists.  No lymphadenopathy.  No pericardial or pleural effusion.  Examination is degraded by patient motion.  Diffusely mildly prominent interstitial markings with evidence of emphysema again noted.  Areas of bibasilar bronchial wall thickening and curvilinear basilar atelectasis or scarring noted.  Pleural effusions have resolved.  Central airways are patent.  No focal consolidation, mass, or nodule.  No acute osseous finding.  High-resolution CT images confirm the above findings without evidence for air trapping.  IMPRESSION: Cardiomegaly with prominent interstitial markings and bronchial wall thickening, likely emphysema with superimposed acute or chronic bronchitis.   Original Report Authenticated By: Christiana Pellant, [name]   Dg Chest Port 1 View  11/27/2012   *RADIOLOGY REPORT*  Clinical Data: Central line placement.  PORTABLE CHEST - 1 VIEW  Comparison: 11/27/2012.  Findings: The right PICC line is stable.  The right IJ Swan-Ganz catheter is looped in the right atrium.  The cardiac silhouette, mediastinal and hilar contours are stable.  Slight improved lung aeration with some persistent interstitial edema.  IMPRESSION:   1.  Right IJ Swan-Ganz catheter is looped in the right atrium. 2.  Improved lung aeration with resolving interstitial edema.   Original Report Authenticated By: Rudie Meyer, [name]   Dg Chest Port 1 View  11/27/2012   *RADIOLOGY REPORT*  Clinical Data: Congestion.  Short of breath.  Evaluate endotracheal tube.  PORTABLE CHEST - 1 VIEW  Comparison: 11/25/2012.  Findings: There is no endotracheal tube present.  Right upper extremity PICC in the mid-to-lower SVC is unchanged.  Diffuse interstitial pattern also appears similar.  Cardiomegaly.  Tortuous thoracic aorta with arch atherosclerosis. Aortic branch vessel atherosclerosis also appears similar.  IMPRESSION: No interval change.  Cardiomegaly and diffuse interstitial pattern likely representing interstitial pulmonary edema.   Original Report Authenticated By: Andreas Newport, [name]     Medications:  Current Medications: . ipratropium  0.5 mg Nebulization Q4H   And  . albuterol  2.5 mg Nebulization Q4H  . antiseptic oral rinse  15 mL Mouth Rinse BID  . aspirin EC  81 mg Oral Daily  . atorvastatin  40 mg Oral QHS  . budesonide-formoterol  2 puff Inhalation BID  . heparin  5,000 Units Subcutaneous Q8H  . heparin  5,000 Units Intravenous Once  . levothyroxine  125 mcg Oral QAC breakfast  . magnesium oxide  400 mg Oral Daily  . methylPREDNISolone (SOLU-MEDROL) injection  20 mg Intravenous Q12H  . multivitamin with minerals  1 tablet Oral Daily  . pantoprazole  40 mg Oral Daily  . sodium chloride  3 mL Intravenous Q12H  . Tadalafil (PAH)  20 mg Oral Daily    Infusions: . milrinone 0.375 mcg/kg/min (11/28/12 1610)     Assessment:   1. Acute on chronic respiratory failure 2. Cor pulmonale, acute on chronic 3. Acute on chronic diastolic HF. 4. Severe COPD 5. Mixed PAH 6. PAH 7. Acute renal failure  Plan/Discussion:    She has end-stage PAH and is requiring 50% O2 to maintain her sats despite milrinone. I discussed case with Dr.  Monia Pouch at [facility] and we will plan empiric trial of NO today to see if it improves oxygenation without producing pulmonary edema. Will also get VQ today to look for PE and also differential lung perfusion given difference between PCWPs in various lung regions. Although, CT chest was non-contrast there was no obvious compressions of external compression of pulmonary veins. If responds to NO will consider Flolan versus inhaled prostanoid.  Prognosis is guarded.   The patient is critically ill with multiple organ systems failure and requires high complexity decision making for assessment and support, frequent evaluation and titration of therapies, application of advanced monitoring technologies and extensive interpretation of multiple databases.   Critical Care Time devoted to patient care services described in this note is 35 Minutes.  Length of Stay: 3  Ulla Potash B,MD 7:15 AM  Patient seen and examined with Ulla Potash, NP. We discussed all aspects of the encounter. I have edited the note above.  I agree with the assessment and plan as stated above. Plan for NO challenge today. Keep in ICU.   Psalms Olarte,MD 8:23 AM

## 2012-11-28 NOTE — Procedures (Signed)
Pulmonary Artery Catheter Insertion Procedure Note DORENDA PFANNENSTIEL [id number] October 12, 1932  Procedure: Insertion of  PA Catheter Indications:  Assessment of PA pressures while on nitric oxide  Procedure Details Consent: Risks of procedure as well as the alternatives and risks of each were explained to the (patient/caregiver).  Consent for procedure obtained. Time Out: Verified patient identification, verified procedure, site/side was marked, verified correct patient position, special equipment/implants available, medications/allergies/relevent history reviewed, required imaging and test results available.  Performed  Maximum sterile technique was used including antiseptics, cap, gloves, gown, hand hygiene, mask and sheet. The patient previously had a PA catheter placed yesterday but this became dislodged and had to be removed last evening. The previously placed sheath was still in place. This was copiously scrubbed with multiple chlorhexidine swabs. The patient was then draped in full sterile fashion. A wire was placed through the existing sheath and the sheath was removed. A new sheath was placed in the RIC over the wire and secured into position with a suture. The PA catheter was then navigated into the pulmonary artery using hemodynamic tracing for guidance. There were no apparent complications. Patient will receive 1g vancomycin post-procedure as a precautionary measure given need to exchange sheath over a wire.  Evaluation Blood flow good Complications: No apparent complications Patient did tolerate procedure well. Chest X-ray ordered to verify placement.  CXR: pending.  Markies Mowatt 11/28/2012, 7:10 PM

## 2012-11-29 ENCOUNTER — Inpatient Hospital Stay (HOSPITAL_COMMUNITY): Payer: Medicare Other

## 2012-11-29 LAB — BASIC METABOLIC PANEL
Calcium: 8.5 mg/dL (ref 8.4–10.5)
Creatinine, Ser: 1.35 mg/dL — ABNORMAL HIGH (ref 0.50–1.10)
GFR calc Af Amer: 42 mL/min — ABNORMAL LOW (ref >90)
GFR calc non Af Amer: 37 mL/min — ABNORMAL LOW (ref >90)
Sodium: 137 mEq/L (ref 135–145)

## 2012-11-29 LAB — CARBOXYHEMOGLOBIN
O2 Saturation: 63.5 %
Total hemoglobin: 7.2 g/dL — ABNORMAL LOW (ref 12.0–16.0)

## 2012-11-29 MED ORDER — TORSEMIDE 20 MG PO TABS
20.0000 mg | ORAL_TABLET | Freq: Every day | ORAL | Status: DC
  Administered 2012-11-29: 20 mg via ORAL
  Filled 2012-11-29 (×2): qty 1

## 2012-11-29 MED ORDER — TADALAFIL (PAH) 20 MG PO TABS
20.0000 mg | ORAL_TABLET | Freq: Two times a day (BID) | ORAL | Status: DC
  Administered 2012-11-29 – 2012-12-09 (×21): 20 mg via ORAL
  Filled 2012-11-29 (×24): qty 1

## 2012-11-29 NOTE — Progress Notes (Signed)
Advanced Heart Failure Team Rounding Note   Primary Cardiologist:  DB  Reason for Consultation: Respiratory failure  HPI:    Kelly Hart is a 79 year old AA female with of pulseless electrical activity arrest, COPD, right heart failure secondary to cor pulmonale, pulmonary arterial hypertension (multifactorial but COPD plays a role), chronic respiratory failure, multifocal atrial tachycardia, peripheral arterial disease status post aortobifem bypass.  RHC 11/28/12 Done on milrinone 0.375 mcg/kg/min  RA = 13  RV = 74/0/15  PA = 88/28 (47)  R lower PCW = 27  R upper PCW = 20  L upper PCW = 12  Fick cardiac output/index = 5.6/3.7  PVR = 6.3 Woods  Ao sat = 94%  PA sat = 69%, 70%  SVC sat = 62%  Echo 01/08/12: LVEF 60-70%. Ventricular septum showed septal bounce. RV mod-severely dilated. Atrial septum bowed right to left. PASP 82 mmHG.  Echo (2/14): EF 55-60%, RV dilated with mild to moderate systolic dysfunction, D-shaped septum, PA systolic pressure 83 mmHg, severe TR.   CT chest (non contrast): Cardiomegaly with prominent interstitial markings and bronchial  wall thickening, likely emphysema with superimposed acute or  chronic bronchitis.  VQ scan no evidence of PE. There was equal perfusion to both lungs despite regional differences in PCWP.    She has been on NO for 24 hours. PA pressures markedly decreased (now in 55-60 range) but sats still drop into the 70s easily. Remains on milrinone as well.  Co-ox 64% . Renal function improved  ESR 3   Objective:    Vital Signs:   Temp:  [97.9 F (36.6 C)-99 F (37.2 C)] 98.8 F (37.1 C) (08/16 0700) Pulse Rate:  [85-118] 108 (08/16 0700) Resp:  [14-41] 15 (08/16 0700) BP: (117-151)/(65-90) 123/75 mmHg (08/16 0700) SpO2:  [78 %-100 %] 100 % (08/16 0806) FiO2 (%):  [50 %] 50 % (08/15 1055) Weight:  [50.6 kg (111 lb 8.8 oz)] 50.6 kg (111 lb 8.8 oz) (08/16 0500) Last BM Date: 11/28/12  Weight change: Filed Weights   11/27/12 0400  11/28/12 0500 11/29/12 0500  Weight: 50.5 kg (111 lb 5.3 oz) 48.3 kg (106 lb 7.7 oz) 50.6 kg (111 lb 8.8 oz)    Intake/Output:   Intake/Output Summary (Last 24 hours) at 11/29/12 0842 Last data filed at 11/29/12 0600  Gross per 24 hour  Intake   1081 ml  Output   1310 ml  Net   -229 ml     Physical Exam: General: Thin. Frail Wearing venti mask HEENT: normal  Neck: supple. JVP to 8-9 Carotids 2+ bilaterally; no bruits. No lymphadenopathy or thryomegaly appreciated; R cordis Cor: PMI normal.Tachycardic, regular rhythm. Loud TR murmur  +prominent s2. +RV lift  Lungs: Decreased breath sounds throughout, occasional rhonchi, no wheezes  Abdomen: soft, nontender, nondistended. No hepatosplenomegaly. No bruits or masses. Good bowel sounds.  Extremities: no cyanosis, clubbing, rash, edema. RUE PICC  Neuro: alert & orientedx3, cranial nerves grossly intact. Moves all 4 extremities w/o difficulty. Affect pleasant.   Telemetry: Sinus tach  Labs: Basic Metabolic Panel:  Recent Labs Lab 11/25/12 2230 11/26/12 0444 11/27/12 0425 11/27/12 1610 11/28/12 0410 11/29/12 0500  NA 141 139 139  --  136 137  K 3.7 3.6 4.0  --  4.0 4.6  CL 108 106 106  --  104 106  CO2 22 22 21   --  22 23  GLUCOSE 113* 144* 146*  --  130* 103*  BUN 14 12 24*  --  28* 25*  CREATININE 1.28* 1.14* 2.04* 1.70* 1.56* 1.35*  CALCIUM 8.9 8.7 8.9  --  8.6 8.5  MG  --   --  2.0  --   --   --     Liver Function Tests:  Recent Labs Lab 11/25/12 2230 11/27/12 0425  AST 26 20  ALT 18 15  ALKPHOS 85 73  BILITOT 0.5 0.4  PROT 6.9 6.0  ALBUMIN 3.6 3.2*   No results found for this basename: LIPASE, AMYLASE,  in the last 168 hours No results found for this basename: AMMONIA,  in the last 168 hours  CBC:  Recent Labs Lab 11/25/12 2230 11/26/12 0444 11/27/12 0425 11/27/12 1610  WBC 6.7 4.5 5.1 7.8  NEUTROABS 5.4  --  4.6  --   HGB 10.0* 9.3* 8.3* 9.0*  HCT 31.7* 29.2* 25.9* 27.5*  MCV 84.5 84.4 83.3  82.8  PLT 247 222 225 254    Cardiac Enzymes:  Recent Labs Lab 11/25/12 2231  TROPONINI <0.30    BNP: BNP (last 3 results)  Recent Labs  01/07/12 2229 10/28/12 1418 11/25/12 2231  PROBNP 1049.0* 3408.0* 5459.0*    CBG:  Recent Labs Lab 11/26/12 0110  GLUCAP 132*    Coagulation Studies:  Recent Labs  11/27/12 0900  LABPROT 15.8*  INR 1.29    Other results:  Imaging: Ct Chest Wo Contrast  11/28/2012   *RADIOLOGY REPORT*  Clinical Data: Shortness of breath  CT CHEST WITHOUT CONTRAST  Technique:  Multidetector CT imaging of the chest was performed following the standard protocol without IV contrast.  Comparison: Chest radiograph same date, chest CT 02/02/2011  Findings: Moderate to severe atheromatous aortic calcification without aneurysm.  Streak artifact from the patient's arm obscures detail.  Moderate cardiomegaly persists.  No lymphadenopathy.  No pericardial or pleural effusion.  Examination is degraded by patient motion.  Diffusely mildly prominent interstitial markings with evidence of emphysema again noted.  Areas of bibasilar bronchial wall thickening and curvilinear basilar atelectasis or scarring noted.  Pleural effusions have resolved.  Central airways are patent.  No focal consolidation, mass, or nodule.  No acute osseous finding.  High-resolution CT images confirm the above findings without evidence for air trapping.  IMPRESSION: Cardiomegaly with prominent interstitial markings and bronchial wall thickening, likely emphysema with superimposed acute or chronic bronchitis.   Original Report Authenticated By: Christiana Pellant, [name]   Nm Pulmonary Perf And Vent  11/28/2012   *RADIOLOGY REPORT*  Clinical Data:  Pulmonary hypertension, short of breath  NUCLEAR MEDICINE VENTILATION - PERFUSION LUNG SCAN  Technique:  Ventilation images were obtained in multiple projections using inhaled aerosol technetium 99 M DTPA.  Perfusion images were obtained in multiple  projections after intravenous injection of Tc-69m MAA.  Radiopharmaceuticals:  Tc-32m DTPA aerosol and 6 mCi Tc-45m MAA.  Comparison: Chest radiograph 11/28/2012, noncontrast CT 11/27/2012  Findings:  Ventilation:  There is mild heterogeneity of ventilation.  There is a small defect within the posterior right lower lobe.  Perfusion:  There is a small peripheral defect in the posterior right lower lobe which matches the ventilation defect.  No large wedge shaped peripheral perfusion defects to suggest significant pulmonary embolism.  There is symmetric perfusion to left and right lung.  IMPRESSION:  1. Very low probability for acute pulmonary embolism.  2.  Symmetric perfusion to the left  and right lung.   Original Report Authenticated By: Genevive Bi, [name]   Dg Chest Port 1 View  11/29/2012   *  RADIOLOGY REPORT*  Clinical Data: check PA cath placement  PORTABLE CHEST - 1 VIEW  Comparison: 11/28/12  Findings: Right IJ central line loops on itself over the right atrium and then extends several centimeter further toward the right ventricle.  No pneumothorax.  Stable interstitial opacities bilaterally.  Stable right subclavian line.  IMPRESSION: Right IJ PA catheter should be retracted and repositioned.   Original Report Authenticated By: Esperanza Heir, [name]   Dg Chest Port 1 View  11/28/2012   *RADIOLOGY REPORT*  Clinical Data: 79 year old female Swan-Ganz catheter placement.  PORTABLE CHEST - 1 VIEW  Comparison: 0808 hours the same day and earlier.  Findings: Portable semi upright AP view 2013 hours.  Right IJ approach Swan-Ganz catheter placement.  Tip at the level of the right lower lobe pulmonary artery, not be on the right heart contour.  Superimposed right PICC line appears stable.  Stable lung volumes.  Stable cardiac size and mediastinal contours. Stable pulmonary vascular congestion or interstitial markings.  No pneumothorax or definite effusion.  Overall stable ventilation.  New rectangular  radiopacity projecting over the trachea at the level of the larynx, presumably external artifact.  IMPRESSION: 1.  Right IJ Swan-Ganz catheter placed, tip at the right lower lobe pulmonary artery level. 2.  Otherwise stable chest. 3.  Small rectangular radiopacity projecting over the subglottic trachea presumably external artifact.   Original Report Authenticated By: Erskine Speed, [name]   Dg Chest Port 1 View  11/28/2012   *RADIOLOGY REPORT*  Clinical Data: Shortness of breath.  PORTABLE CHEST - 1 VIEW  Comparison: 11/27/2012 plain film and CT.  Findings: Right-sided PICC line unchanged tip at low SVC.  The right sided internal jugular Swan-Ganz catheter has been removed with a central line remaining in place.  This terminates at the mid SVC.  Moderate cardiomegaly with atherosclerosis in the transverse aorta. Mild right hemidiaphragm elevation, unchanged. No pleural effusion or pneumothorax.  Diffuse interstitial thickening is lower lobe predominant and not significantly changed.  Mild right base atelectasis.  IMPRESSION: Cardiomegaly, without congestive failure.  Removal of Swan-Ganz catheter.  COPD/chronic bronchitis.   Original Report Authenticated By: Jeronimo Greaves, [name]   Dg Chest Port 1 View  11/27/2012   *RADIOLOGY REPORT*  Clinical Data: Central line placement.  PORTABLE CHEST - 1 VIEW  Comparison: 11/27/2012.  Findings: The right PICC line is stable.  The right IJ Swan-Ganz catheter is looped in the right atrium.  The cardiac silhouette, mediastinal and hilar contours are stable.  Slight improved lung aeration with some persistent interstitial edema.  IMPRESSION:  1.  Right IJ Swan-Ganz catheter is looped in the right atrium. 2.  Improved lung aeration with resolving interstitial edema.   Original Report Authenticated By: Rudie Meyer, [name]     Medications:     Current Medications: . ipratropium  0.5 mg Nebulization Q4H   And  . albuterol  2.5 mg Nebulization Q4H  . antiseptic oral rinse  15  mL Mouth Rinse BID  . aspirin EC  81 mg Oral Daily  . atorvastatin  40 mg Oral QHS  . budesonide-formoterol  2 puff Inhalation BID  . heparin  5,000 Units Subcutaneous Q8H  . heparin  5,000 Units Intravenous Once  . levothyroxine  125 mcg Oral QAC breakfast  . magnesium oxide  400 mg Oral Daily  . multivitamin with minerals  1 tablet Oral Daily  . pantoprazole  40 mg Oral Daily  . sodium chloride  3 mL Intravenous Q12H  .  Tadalafil (PAH)  20 mg Oral Daily    Infusions: . milrinone 0.376 mcg/kg/min (11/29/12 0600)     Assessment:   1. Acute on chronic respiratory failure 2. Cor pulmonale, acute on chronic 3. Acute on chronic diastolic HF. 4. Severe COPD 5. Mixed PAH 6. PAH 7. Acute renal failure  Plan/Discussion:     Loni Muse catheter curled up this am in th RA. I tried to maneuver it back into position this am through the sleeve but the sleeve ripped breaking sterile conditions. Thus the entire right neck was prepped and draped sterilely including the existing Cordis. We replaced the old Ernestine Conrad with a new catheter and maneuvered it in to the PA position with hemodynamic tracing used for guidance.   She has end-stage PAH/lung disease. PA pressure much improved with milrinone and NO howevere still requiring high-flow O2 suggesting that PAH not primary reason for current deterioration. Not sure we have any other options to improve her oxygenation. Will continue NO today. Need to discuss with Pulmonary and [facility] team. Keep in ICU.   Prognosis guarded,at best.   The patient is critically ill with multiple organ systems failure and requires high complexity decision making for assessment and support, frequent evaluation and titration of therapies, application of advanced monitoring technologies and extensive interpretation of multiple databases.   Critical Care Time devoted to patient care services described in this note is 60 Minutes.  Length of Stay: 4  Zebulan Hinshaw,MD 8:42 AM

## 2012-11-30 LAB — BASIC METABOLIC PANEL
BUN: 23 mg/dL (ref 6–23)
CO2: 27 mEq/L (ref 19–32)
Calcium: 8.1 mg/dL — ABNORMAL LOW (ref 8.4–10.5)
Glucose, Bld: 93 mg/dL (ref 70–99)
Sodium: 139 mEq/L (ref 135–145)

## 2012-11-30 LAB — CARBOXYHEMOGLOBIN: O2 Saturation: 61.8 %

## 2012-11-30 NOTE — Progress Notes (Signed)
Advanced Heart Failure Team Rounding Note   Primary Cardiologist:  DB  Reason for Consultation: Respiratory failure  HPI:    Kelly Hart is a 79 year old AA female with of pulseless electrical activity arrest, COPD, right heart failure secondary to cor pulmonale, pulmonary arterial hypertension (multifactorial but COPD plays a role), chronic respiratory failure, multifocal atrial tachycardia, peripheral arterial disease status post aortobifem bypass.  RHC 11/28/12 Done on milrinone 0.375 mcg/kg/min  RA = 13  RV = 74/0/15  PA = 88/28 (47)  R lower PCW = 27  R upper PCW = 20  L upper PCW = 12  Fick cardiac output/index = 5.6/3.7  PVR = 6.3 Woods  Ao sat = 94%  PA sat = 69%, 70%  SVC sat = 62%  Echo 01/08/12: LVEF 60-70%. Ventricular septum showed septal bounce. RV mod-severely dilated. Atrial septum bowed right to left. PASP 82 mmHG.  Echo (2/14): EF 55-60%, RV dilated with mild to moderate systolic dysfunction, D-shaped septum, PA systolic pressure 83 mmHg, severe TR.   CT chest (non contrast): Cardiomegaly with prominent interstitial markings and bronchial  wall thickening, likely emphysema with superimposed acute or  chronic bronchitis.  VQ scan no evidence of PE. There was equal perfusion to both lungs despite regional differences in PCWP.    She has been on NO for 48 hours. PA pressures markedly decreased (now in 45-55 range). Oxygenation a bit better but sats still drop into the 70s easily on 6L. Remains on milrinone as well. Diuresed 4L with one dose of demadex yesterday.   Swan pressures measured personally CVP 1 PAP 50/30 PCWP 9  Co-ox 62% . Renal function improved   ESR 3   Objective:    Vital Signs:   Temp:  [98.4 F (36.9 C)-99.1 F (37.3 C)] 99 F (37.2 C) (08/17 1000) Pulse Rate:  [107-128] 116 (08/17 1000) Resp:  [15-30] 23 (08/17 1000) BP: (73-134)/(54-86) 100/71 mmHg (08/17 1000) SpO2:  [8 %-100 %] 94 % (08/17 1000) Weight:  [48.7 kg (107 lb 5.8 oz)]  48.7 kg (107 lb 5.8 oz) (08/17 0500) Last BM Date: 11/29/12  Weight change: Filed Weights   11/28/12 0500 11/29/12 0500 11/30/12 0500  Weight: 48.3 kg (106 lb 7.7 oz) 50.6 kg (111 lb 8.8 oz) 48.7 kg (107 lb 5.8 oz)    Intake/Output:   Intake/Output Summary (Last 24 hours) at 11/30/12 1112 Last data filed at 11/30/12 1000  Gross per 24 hour  Intake 1171.5 ml  Output   3875 ml  Net -2703.5 ml     Physical Exam: General: Thin. Frail On nasal cannula with NO HEENT: normal  Neck: supple. JVP to <5 Carotids 2+ bilaterally; no bruits. No lymphadenopathy or thryomegaly appreciated; R cordis Cor: PMI normal.Tachycardic, regular rhythm. Loud TR murmur  +prominent s2. +RV lift  Lungs: Decreased breath sounds throughout, occasional rhonchi, no wheezes  Abdomen: soft, nontender, nondistended. No hepatosplenomegaly. No bruits or masses. Good bowel sounds.  Extremities: no cyanosis, clubbing, rash, edema. RUE PICC  Neuro: alert & orientedx3, cranial nerves grossly intact. Moves all 4 extremities w/o difficulty. Affect pleasant.   Telemetry: Sinus tach  Labs: Basic Metabolic Panel:  Recent Labs Lab 11/26/12 0444 11/27/12 0425 11/27/12 1610 11/28/12 0410 11/29/12 0500 11/30/12 0500  NA 139 139  --  136 137 139  K 3.6 4.0  --  4.0 4.6 3.7  CL 106 106  --  104 106 103  CO2 22 21  --  22 23 27  GLUCOSE 144* 146*  --  130* 103* 93  BUN 12 24*  --  28* 25* 23  CREATININE 1.14* 2.04* 1.70* 1.56* 1.35* 1.35*  CALCIUM 8.7 8.9  --  8.6 8.5 8.1*  MG  --  2.0  --   --   --   --     Liver Function Tests:  Recent Labs Lab 11/25/12 2230 11/27/12 0425  AST 26 20  ALT 18 15  ALKPHOS 85 73  BILITOT 0.5 0.4  PROT 6.9 6.0  ALBUMIN 3.6 3.2*   No results found for this basename: LIPASE, AMYLASE,  in the last 168 hours No results found for this basename: AMMONIA,  in the last 168 hours  CBC:  Recent Labs Lab 11/25/12 2230 11/26/12 0444 11/27/12 0425 11/27/12 1610  WBC 6.7 4.5  5.1 7.8  NEUTROABS 5.4  --  4.6  --   HGB 10.0* 9.3* 8.3* 9.0*  HCT 31.7* 29.2* 25.9* 27.5*  MCV 84.5 84.4 83.3 82.8  PLT 247 222 225 254    Cardiac Enzymes:  Recent Labs Lab 11/25/12 2231  TROPONINI <0.30    BNP: BNP (last 3 results)  Recent Labs  01/07/12 2229 10/28/12 1418 11/25/12 2231  PROBNP 1049.0* 3408.0* 5459.0*    CBG:  Recent Labs Lab 11/26/12 0110  GLUCAP 132*    Coagulation Studies: No results found for this basename: LABPROT, INR,  in the last 72 hours  Other results:  Imaging: Nm Pulmonary Perf And Vent  11/28/2012   *RADIOLOGY REPORT*  Clinical Data:  Pulmonary hypertension, short of breath  NUCLEAR MEDICINE VENTILATION - PERFUSION LUNG SCAN  Technique:  Ventilation images were obtained in multiple projections using inhaled aerosol technetium 99 M DTPA.  Perfusion images were obtained in multiple projections after intravenous injection of Tc-86m MAA.  Radiopharmaceuticals:  Tc-94m DTPA aerosol and 6 mCi Tc-74m MAA.  Comparison: Chest radiograph 11/28/2012, noncontrast CT 11/27/2012  Findings:  Ventilation:  There is mild heterogeneity of ventilation.  There is a small defect within the posterior right lower lobe.  Perfusion:  There is a small peripheral defect in the posterior right lower lobe which matches the ventilation defect.  No large wedge shaped peripheral perfusion defects to suggest significant pulmonary embolism.  There is symmetric perfusion to left and right lung.  IMPRESSION:  1. Very low probability for acute pulmonary embolism.  2.  Symmetric perfusion to the left  and right lung.   Original Report Authenticated By: Genevive Bi, [name]   Dg Chest Port 1 View  11/29/2012   CLINICAL DATA:  Central line placement. Acute on chronic respiratory failure. Acute renal failure.  EXAM: PORTABLE CHEST - 1 VIEW  COMPARISON:  11/29/2012  FINDINGS: The Swan-Ganz catheter is been repositioned with the tip now in the right lower lobe pulmonary  artery. Right subclavian central venous catheter tip remains in the distal SVC. No pneumothorax identified.  Cardiomegaly is stable. Diffuse interstitial prominence is unchanged, however there is no evidence of airspace disease or effusion.  IMPRESSION: Reposition Swan-Ganz catheter tip is now in the right lower lobe pulmonary artery. No evidence of pneumothorax. Stable cardiomegaly and diffuse interstitial prominence.   Electronically Signed   By: Myles Rosenthal   On: 11/29/2012 11:39   Dg Chest Port 1 View  11/29/2012   *RADIOLOGY REPORT*  Clinical Data: check PA cath placement  PORTABLE CHEST - 1 VIEW  Comparison: 11/28/12  Findings: Right IJ central line loops on itself over the right atrium and  then extends several centimeter further toward the right ventricle.  No pneumothorax.  Stable interstitial opacities bilaterally.  Stable right subclavian line.  IMPRESSION: Right IJ PA catheter should be retracted and repositioned.   Original Report Authenticated By: Esperanza Heir, [name]   Dg Chest Port 1 View  11/28/2012   *RADIOLOGY REPORT*  Clinical Data: 79 year old female Swan-Ganz catheter placement.  PORTABLE CHEST - 1 VIEW  Comparison: 0808 hours the same day and earlier.  Findings: Portable semi upright AP view 2013 hours.  Right IJ approach Swan-Ganz catheter placement.  Tip at the level of the right lower lobe pulmonary artery, not be on the right heart contour.  Superimposed right PICC line appears stable.  Stable lung volumes.  Stable cardiac size and mediastinal contours. Stable pulmonary vascular congestion or interstitial markings.  No pneumothorax or definite effusion.  Overall stable ventilation.  New rectangular radiopacity projecting over the trachea at the level of the larynx, presumably external artifact.  IMPRESSION: 1.  Right IJ Swan-Ganz catheter placed, tip at the right lower lobe pulmonary artery level. 2.  Otherwise stable chest. 3.  Small rectangular radiopacity projecting over the  subglottic trachea presumably external artifact.   Original Report Authenticated By: Erskine Speed, [name]     Medications:     Current Medications: . ipratropium  0.5 mg Nebulization Q4H   And  . albuterol  2.5 mg Nebulization Q4H  . antiseptic oral rinse  15 mL Mouth Rinse BID  . aspirin EC  81 mg Oral Daily  . atorvastatin  40 mg Oral QHS  . budesonide-formoterol  2 puff Inhalation BID  . heparin  5,000 Units Subcutaneous Q8H  . heparin  5,000 Units Intravenous Once  . levothyroxine  125 mcg Oral QAC breakfast  . magnesium oxide  400 mg Oral Daily  . multivitamin with minerals  1 tablet Oral Daily  . pantoprazole  40 mg Oral Daily  . sodium chloride  3 mL Intravenous Q12H  . Tadalafil (PAH)  20 mg Oral BID  . torsemide  20 mg Oral Daily    Infusions: . milrinone 0.375 mcg/kg/min (11/30/12 0600)     Assessment:   1. Acute on chronic respiratory failure 2. Cor pulmonale, acute on chronic 3. Acute on chronic diastolic HF. 4. Severe COPD 5. Mixed PAH 6. PAH 7. Acute renal failure  Plan/Discussion:    She has end-stage PAH/lung disease. PA pressure much improved with milrinone and NO. Oxygenation only improved slightly suggesting that PAH not primary reason for current deterioration. Not sure we have any other options to improve her oxygenation. Will continue NO today. Need to discuss with Pulmonary and [facility] team if trial of Flolan warranted. Keep in ICU.   Prognosis guarded,at best.   The patient is critically ill with multiple organ systems failure and requires high complexity decision making for assessment and support, frequent evaluation and titration of therapies, application of advanced monitoring technologies and extensive interpretation of multiple databases.   Critical Care Time devoted to patient care services described in this note is 60 Minutes.  Length of Stay: 5  Daniel Bensimhon,MD 11:12 AM

## 2012-11-30 NOTE — Progress Notes (Signed)
CALLED BY STAFF RN TO ASSESS PT'S RIGHT ARM PICC FOR CLOTTED GRAY PORT, CAP TO PORT REMOVED AND ATTEMPTED TO FLUSH BUT NOT SUCCESSFUL, LARGE AMOUNT OF RESISTANCE MEET, ATTEMPTED TO FLUSH WITH SMALL AMOUNT OF HEPARIN AND STILL WAS MET WITH LARGE AMOUNT OF RESISTANCE, DEAD IN CAP PLACE AND TAPPED CLOSED, NO TPA ORDERED DUE TO THE AMOUNT OF RESISTANCE MET, PT STATES THAT THE PORT HAS BEEN SLUGGISH FOR A WHILE, STAFF RN UPDATED, TO INFORM MD

## 2012-12-01 LAB — BASIC METABOLIC PANEL
Chloride: 105 mEq/L (ref 96–112)
GFR calc Af Amer: 46 mL/min — ABNORMAL LOW (ref >90)
GFR calc non Af Amer: 40 mL/min — ABNORMAL LOW (ref >90)
Glucose, Bld: 90 mg/dL (ref 70–99)
Potassium: 3.8 mEq/L (ref 3.5–5.1)
Sodium: 137 mEq/L (ref 135–145)

## 2012-12-01 LAB — CARBOXYHEMOGLOBIN
Carboxyhemoglobin: 1.8 % — ABNORMAL HIGH (ref 0.5–1.5)
Methemoglobin: 0.9 % (ref 0.0–1.5)
O2 Saturation: 72.3 %

## 2012-12-01 MED ORDER — SODIUM CHLORIDE 0.9 % IV SOLN
INTRAVENOUS | Status: DC
  Administered 2012-12-04: 10 mL/h via INTRAVENOUS

## 2012-12-01 MED ORDER — ALBUTEROL SULFATE (5 MG/ML) 0.5% IN NEBU: 2.5000 mg | INHALATION_SOLUTION | RESPIRATORY_TRACT | Status: DC

## 2012-12-01 MED ORDER — IPRATROPIUM BROMIDE 0.02 % IN SOLN: 0.5000 mg | Freq: Three times a day (TID) | RESPIRATORY_TRACT | Status: DC

## 2012-12-01 MED ORDER — SODIUM CHLORIDE 0.9 % IJ SOLN
10.0000 mL | INTRAMUSCULAR | Status: DC | PRN
  Administered 2012-12-06: 10 mL

## 2012-12-01 MED ORDER — POTASSIUM CHLORIDE CRYS ER 20 MEQ PO TBCR
20.0000 meq | EXTENDED_RELEASE_TABLET | Freq: Once | ORAL | Status: AC
  Administered 2012-12-01: 20 meq via ORAL
  Filled 2012-12-01: qty 1

## 2012-12-01 MED ORDER — ALBUTEROL SULFATE (5 MG/ML) 0.5% IN NEBU
2.5000 mg | INHALATION_SOLUTION | Freq: Three times a day (TID) | RESPIRATORY_TRACT | Status: DC
  Administered 2012-12-01 – 2012-12-09 (×26): 2.5 mg via RESPIRATORY_TRACT
  Filled 2012-12-01 (×28): qty 0.5

## 2012-12-01 MED ORDER — SODIUM CHLORIDE 0.9 % IV SOLN: INTRAVENOUS | Status: DC

## 2012-12-01 MED ORDER — ALBUTEROL SULFATE (5 MG/ML) 0.5% IN NEBU: 2.5000 mg | INHALATION_SOLUTION | RESPIRATORY_TRACT | Status: DC | PRN

## 2012-12-01 MED ORDER — SODIUM CHLORIDE 0.9 % IJ SOLN
10.0000 mL | Freq: Two times a day (BID) | INTRAMUSCULAR | Status: DC
  Administered 2012-12-01 – 2012-12-09 (×9): 10 mL

## 2012-12-01 MED ORDER — IPRATROPIUM BROMIDE 0.02 % IN SOLN
0.5000 mg | Freq: Three times a day (TID) | RESPIRATORY_TRACT | Status: DC
  Administered 2012-12-01 – 2012-12-09 (×26): 0.5 mg via RESPIRATORY_TRACT
  Filled 2012-12-01 (×30): qty 2.5

## 2012-12-01 NOTE — Progress Notes (Signed)
Advanced Heart Failure Team Rounding Note   Primary Cardiologist:  DB  Reason for Consultation: Respiratory failure  HPI:    [name] is a 79 year old AA female with of pulseless electrical activity arrest, COPD, right heart failure secondary to cor pulmonale, pulmonary arterial hypertension (multifactorial but COPD plays a role), chronic respiratory failure, multifocal atrial tachycardia, peripheral arterial disease status post aortobifem bypass.  RHC 11/28/12 Done on milrinone 0.375 mcg/kg/min  RA = 13  RV = 74/0/15  PA = 88/28 (47)  R lower PCW = 27  R upper PCW = 20  L upper PCW = 12  Fick cardiac output/index = 5.6/3.7  PVR = 6.3 Woods  Ao sat = 94%  PA sat = 69%, 70%  SVC sat = 62%  Echo 01/08/12: LVEF 60-70%. Ventricular septum showed septal bounce. RV mod-severely dilated. Atrial septum bowed right to left. PASP 82 mmHG.  Echo (2/14): EF 55-60%, RV dilated with mild to moderate systolic dysfunction, D-shaped septum, PA systolic pressure 83 mmHg, severe TR.   CT chest (non contrast): Cardiomegaly with prominent interstitial markings and bronchial  wall thickening, likely emphysema with superimposed acute or  chronic bronchitis.  VQ scan no evidence of PE. There was equal perfusion to both lungs despite regional differences in PCWP.    She has been on NO for 72 hours. PA pressures markedly decreased (now in 45-55 range). Oxygenation a bit better but sats still drop into the 70s easily on 6L. Remains on milrinone as well. Diuresed 4L with one dose of demadex over the weekend.   Swan pressures measured personally CVP 6 PAP 48/32 PCWP 16  Co-ox 72% . Renal function improved   ESR 3   Objective:    Vital Signs:   Temp:  [98.1 F (36.7 C)-99.1 F (37.3 C)] 98.1 F (36.7 C) (08/18 0600) Pulse Rate:  [101-136] 101 (08/18 0600) Resp:  [15-23] 19 (08/18 0600) BP: (73-135)/(52-87) 127/82 mmHg (08/18 0600) SpO2:  [85 %-100 %] 97 % (08/18 0600) Weight:  [49.2 kg (108  lb 7.5 oz)] 49.2 kg (108 lb 7.5 oz) (08/18 0500) Last BM Date: 11/29/12  Weight change: Filed Weights   11/29/12 0500 11/30/12 0500 12/01/12 0500  Weight: 50.6 kg (111 lb 8.8 oz) 48.7 kg (107 lb 5.8 oz) 49.2 kg (108 lb 7.5 oz)    Intake/Output:   Intake/Output Summary (Last 24 hours) at 12/01/12 0758 Last data filed at 12/01/12 0600  Gross per 24 hour  Intake 1481.5 ml  Output   1305 ml  Net  176.5 ml     Physical Exam: General: Thin. Frail On nasal cannula with NO HEENT: normal  Neck: supple. JVP to <5 Carotids 2+ bilaterally; no bruits. No lymphadenopathy or thryomegaly appreciated; R cordis Cor: PMI normal.Tachycardic, regular rhythm. Loud TR murmur  +prominent s2. +RV lift  Lungs: Decreased breath sounds throughout, occasional rhonchi, no wheezes  Abdomen: soft, nontender, nondistended. No hepatosplenomegaly. No bruits or masses. Good bowel sounds.  Extremities: no cyanosis, clubbing, rash, edema. RUE PICC  Neuro: alert & orientedx3, cranial nerves grossly intact. Moves all 4 extremities w/o difficulty. Affect pleasant.   Telemetry: Sinus tach  Labs: Basic Metabolic Panel:  Recent Labs Lab 11/27/12 0425 11/27/12 1610 11/28/12 0410 11/29/12 0500 11/30/12 0500 12/01/12 0500  NA 139  --  136 137 139 137  K 4.0  --  4.0 4.6 3.7 3.8  CL 106  --  104 106 103 105  CO2 21  --  22  23 27 25   GLUCOSE 146*  --  130* 103* 93 90  BUN 24*  --  28* 25* 23 17  CREATININE 2.04* 1.70* 1.56* 1.35* 1.35* 1.25*  CALCIUM 8.9  --  8.6 8.5 8.1* 7.9*  MG 2.0  --   --   --   --   --     Liver Function Tests:  Recent Labs Lab 11/25/12 2230 11/27/12 0425  AST 26 20  ALT 18 15  ALKPHOS 85 73  BILITOT 0.5 0.4  PROT 6.9 6.0  ALBUMIN 3.6 3.2*   No results found for this basename: LIPASE, AMYLASE,  in the last 168 hours No results found for this basename: AMMONIA,  in the last 168 hours  CBC:  Recent Labs Lab 11/25/12 2230 11/26/12 0444 11/27/12 0425 11/27/12 1610  WBC  6.7 4.5 5.1 7.8  NEUTROABS 5.4  --  4.6  --   HGB 10.0* 9.3* 8.3* 9.0*  HCT 31.7* 29.2* 25.9* 27.5*  MCV 84.5 84.4 83.3 82.8  PLT 247 222 225 254    Cardiac Enzymes:  Recent Labs Lab 11/25/12 2231  TROPONINI <0.30    BNP: BNP (last 3 results)  Recent Labs  01/07/12 2229 10/28/12 1418 11/25/12 2231  PROBNP 1049.0* 3408.0* 5459.0*    CBG:  Recent Labs Lab 11/26/12 0110  GLUCAP 132*    Coagulation Studies: No results found for this basename: LABPROT, INR,  in the last 72 hours  Other results:  Imaging: Dg Chest Port 1 View  11/29/2012   CLINICAL DATA:  Central line placement. Acute on chronic respiratory failure. Acute renal failure.  EXAM: PORTABLE CHEST - 1 VIEW  COMPARISON:  11/29/2012  FINDINGS: The Swan-Ganz catheter is been repositioned with the tip now in the right lower lobe pulmonary artery. Right subclavian central venous catheter tip remains in the distal SVC. No pneumothorax identified.  Cardiomegaly is stable. Diffuse interstitial prominence is unchanged, however there is no evidence of airspace disease or effusion.  IMPRESSION: Reposition Swan-Ganz catheter tip is now in the right lower lobe pulmonary artery. No evidence of pneumothorax. Stable cardiomegaly and diffuse interstitial prominence.   Electronically Signed   By: Myles Rosenthal   On: 11/29/2012 11:39     Medications:     Current Medications: . albuterol  2.5 mg Nebulization TID   And  . ipratropium  0.5 mg Nebulization TID  . antiseptic oral rinse  15 mL Mouth Rinse BID  . aspirin EC  81 mg Oral Daily  . atorvastatin  40 mg Oral QHS  . budesonide-formoterol  2 puff Inhalation BID  . heparin  5,000 Units Subcutaneous Q8H  . heparin  5,000 Units Intravenous Once  . levothyroxine  125 mcg Oral QAC breakfast  . magnesium oxide  400 mg Oral Daily  . multivitamin with minerals  1 tablet Oral Daily  . pantoprazole  40 mg Oral Daily  . sodium chloride  3 mL Intravenous Q12H  . Tadalafil (PAH)   20 mg Oral BID    Infusions: . milrinone 0.375 mcg/kg/min (12/01/12 0600)     Assessment:   1. Acute on chronic respiratory failure 2. Cor pulmonale, acute on chronic 3. Acute on chronic diastolic HF. 4. Severe COPD 5. Mixed PAH 6. PAH 7. Acute renal failure  Plan/Discussion:    She has end-stage PAH/lung disease. PA pressure much improved with milrinone and NO. Oxygenation only improved slightly suggesting that the elevated pulmonary pressures themselves are not primary reason for current deterioration.  I discussed case with Dr. Delton Coombes and we do not have any other ways to increase her oxygenation. Will continue NO today. Need to discuss with Pulmonary and [facility] team if trial of Flolan warranted and will improve oxygenation over time. Keep in ICU.   Prognosis guarded, at best.   The patient is critically ill with multiple organ systems failure and requires high complexity decision making for assessment and support, frequent evaluation and titration of therapies, application of advanced monitoring technologies and extensive interpretation of multiple databases.   Critical Care Time devoted to patient care services described in this note is 60 Minutes.  Length of Stay: 6  Kelly Ozimek,MD 7:58 AM

## 2012-12-02 LAB — BASIC METABOLIC PANEL
BUN: 14 mg/dL (ref 6–23)
CO2: 23 mEq/L (ref 19–32)
Chloride: 114 mEq/L — ABNORMAL HIGH (ref 96–112)
Creatinine, Ser: 1.19 mg/dL — ABNORMAL HIGH (ref 0.50–1.10)
GFR calc Af Amer: 49 mL/min — ABNORMAL LOW (ref >90)
Glucose, Bld: 93 mg/dL (ref 70–99)
Potassium: 4.3 mEq/L (ref 3.5–5.1)

## 2012-12-02 LAB — CARBOXYHEMOGLOBIN
Carboxyhemoglobin: 2 % — ABNORMAL HIGH (ref 0.5–1.5)
Methemoglobin: 1.1 % (ref 0.0–1.5)

## 2012-12-02 NOTE — Progress Notes (Addendum)
Advanced Heart Failure Team Rounding Note   Primary Cardiologist:  DB  Reason for Consultation: Respiratory failure  HPI:    Kelly Hart is a 79 year old AA female with of pulseless electrical activity arrest, COPD, right heart failure secondary to cor pulmonale, pulmonary arterial hypertension (multifactorial but COPD plays a role), chronic respiratory failure, multifocal atrial tachycardia, peripheral arterial disease status post aortobifem bypass.  RHC 11/28/12 Done on milrinone 0.375 mcg/kg/min  RA = 13  RV = 74/0/15  PA = 88/28 (47)  R lower PCW = 27  R upper PCW = 20  L upper PCW = 12  Fick cardiac output/index = 5.6/3.7  PVR = 6.3 Woods  Ao sat = 94%  PA sat = 69%, 70%  SVC sat = 62%  Echo 01/08/12: LVEF 60-70%. Ventricular septum showed septal bounce. RV mod-severely dilated. Atrial septum bowed right to left. PASP 82 mmHG.  Echo (2/14): EF 55-60%, RV dilated with mild to moderate systolic dysfunction, D-shaped septum, PA systolic pressure 83 mmHg, severe TR.   CT chest (non contrast): Cardiomegaly with prominent interstitial markings and bronchial  wall thickening, likely emphysema with superimposed acute or  chronic bronchitis.  VQ scan no evidence of PE. There was equal perfusion to both lungs despite regional differences in PCWP.    Remains on NO. PA pressures in the 50-60s now She remains on [place] 7 liters, O2 95-100. With turning or minimal activity oxygenation drops to the 70's. Still on milrinone 0.375. 24 hr I/O +800 cc and weight is  Up 2 lbs.  Swan pressures measured personally CVP 5 PAP 60/28 PCWP  9  Co-ox 80% . Cr 1.19  Denies any SOB,orthopnea or CP.   Objective:    Vital Signs:   Temp:  [98.2 F (36.8 C)-99.5 F (37.5 C)] 98.2 F (36.8 C) (08/19 0500) Pulse Rate:  [101-116] 101 (08/19 0500) Resp:  [16-30] 17 (08/19 0500) BP: (80-126)/(36-90) 108/64 mmHg (08/19 0500) SpO2:  [79 %-100 %] 98 % (08/19 0500) Weight:  [110 lb 7.2 oz (50.1 kg)] 110 lb  7.2 oz (50.1 kg) (08/19 0500) Last BM Date: 12/01/12  Weight change: Filed Weights   11/30/12 0500 12/01/12 0500 12/02/12 0500  Weight: 107 lb 5.8 oz (48.7 kg) 108 lb 7.5 oz (49.2 kg) 110 lb 7.2 oz (50.1 kg)    Intake/Output:   Intake/Output Summary (Last 24 hours) at 12/02/12 0701 Last data filed at 12/02/12 0500  Gross per 24 hour  Intake   1331 ml  Output    515 ml  Net    816 ml     Physical Exam: General: Thin. Frail On nasal cannula with NO; daughter present HEENT: normal  Neck: supple. JVP to <5 Carotids 2+ bilaterally; no bruits. No lymphadenopathy or thryomegaly appreciated; R cordis Cor: PMI normal.Tachycardic, regular rhythm. Loud TR murmur  +prominent s2. +RV lift  Lungs: Decreased breath sounds throughout, occasional rhonchi, no wheezes  Abdomen: soft, nontender, nondistended. No hepatosplenomegaly. No bruits or masses. Good bowel sounds.  Extremities: no cyanosis, clubbing, rash, edema. RUE PICC  Neuro: alert & orientedx3, cranial nerves grossly intact. Moves all 4 extremities w/o difficulty. Affect pleasant.   Telemetry: Sinus tach  Labs: Basic Metabolic Panel:  Recent Labs Lab 11/27/12 0425  11/28/12 0410 11/29/12 0500 11/30/12 0500 12/01/12 0500 12/02/12 0500  NA 139  --  136 137 139 137 144  K 4.0  --  4.0 4.6 3.7 3.8 4.3  CL 106  --  104 106  103 105 114*  CO2 21  --  22 23 27 25 23   GLUCOSE 146*  --  130* 103* 93 90 93  BUN 24*  --  28* 25* 23 17 14   CREATININE 2.04*  < > 1.56* 1.35* 1.35* 1.25* 1.19*  CALCIUM 8.9  --  8.6 8.5 8.1* 7.9* 8.3*  MG 2.0  --   --   --   --   --   --   < > = values in this interval not displayed.  Liver Function Tests:  Recent Labs Lab 11/25/12 2230 11/27/12 0425  AST 26 20  ALT 18 15  ALKPHOS 85 73  BILITOT 0.5 0.4  PROT 6.9 6.0  ALBUMIN 3.6 3.2*   No results found for this basename: LIPASE, AMYLASE,  in the last 168 hours No results found for this basename: AMMONIA,  in the last 168  hours  CBC:  Recent Labs Lab 11/25/12 2230 11/26/12 0444 11/27/12 0425 11/27/12 1610  WBC 6.7 4.5 5.1 7.8  NEUTROABS 5.4  --  4.6  --   HGB 10.0* 9.3* 8.3* 9.0*  HCT 31.7* 29.2* 25.9* 27.5*  MCV 84.5 84.4 83.3 82.8  PLT 247 222 225 254    Cardiac Enzymes:  Recent Labs Lab 11/25/12 2231  TROPONINI <0.30    BNP: BNP (last 3 results)  Recent Labs  01/07/12 2229 10/28/12 1418 11/25/12 2231  PROBNP 1049.0* 3408.0* 5459.0*    CBG:  Recent Labs Lab 11/26/12 0110  GLUCAP 132*    Coagulation Studies: No results found for this basename: LABPROT, INR,  in the last 72 hours  Other results:  Imaging: No results found.   Medications:     Current Medications: . albuterol  2.5 mg Nebulization TID   And  . ipratropium  0.5 mg Nebulization TID  . antiseptic oral rinse  15 mL Mouth Rinse BID  . aspirin EC  81 mg Oral Daily  . atorvastatin  40 mg Oral QHS  . budesonide-formoterol  2 puff Inhalation BID  . heparin  5,000 Units Subcutaneous Q8H  . heparin  5,000 Units Intravenous Once  . levothyroxine  125 mcg Oral QAC breakfast  . magnesium oxide  400 mg Oral Daily  . multivitamin with minerals  1 tablet Oral Daily  . pantoprazole  40 mg Oral Daily  . sodium chloride  10-40 mL Intracatheter Q12H  . Tadalafil (PAH)  20 mg Oral BID    Infusions: . sodium chloride 20 mL/hr at 12/01/12 0700  . sodium chloride 20 mL/hr at 12/01/12 0700  . milrinone 0.376 mcg/kg/min (12/02/12 0300)     Assessment:   1. Acute on chronic respiratory failure 2. Cor pulmonale, acute on chronic 3. Acute on chronic diastolic HF. 4. Severe COPD 5. Mixed PAH 6. PAH 7. Acute renal failure  Plan/Discussion:    She has end-stage PAH/lung disease. PA pressures are starting to trend back up on the milrinone and NO. Oxygenation still remains severely compromised with little movement or activity.   Prognosis guarded, at best.   Length of Stay: 7  Ulla Potash B,MD 7:01  AM  Patient seen and examined with Ulla Potash, NP. We discussed all aspects of the encounter. I agree with the assessment and plan as stated above.   Oxygenation remains poor despite reduction in PA pressures with NO. Long talk with patient and family about options including transfer to [facility] for attempt at Flolan versus trial of Tyvaso or possible comfort care.  I  suspect comfort care is likely the most realistic option but her and her family would like to discuss further.   Swan numbers obtained personally.  The patient is critically ill with multiple organ systems failure and requires high complexity decision making for assessment and support, frequent evaluation and titration of therapies, application of advanced monitoring technologies and extensive interpretation of multiple databases.   Critical Care Time devoted to patient care services described in this note is 35 Minutes.  Truman Hayward 5:36 PM   Spoke with patient and family again. They have decided not to proceed with going to [facility] for Flolan as they worry the effort will be very difficult and benefit likely to be small. Her main desire is to be comfortable. She is interested in trying Tyvaso inhaler if it will help her breathe better. We also discussed code status and has decided to be DNR/DNI. Shock ok though. We discussed Pallaiative Care and she agrees to have them come to discuss goals of care. Will wean NO. Pharmacy team ordering Tyvaso (thanks).   Truman Hayward 6:35 PM

## 2012-12-02 NOTE — Progress Notes (Signed)
Nitric being weaned @ this time, taken down from 20 ppm to 15 ppm per Dr. Gala Romney order, made aware of Protocol-agrees to weaning therapy.

## 2012-12-03 ENCOUNTER — Inpatient Hospital Stay (HOSPITAL_COMMUNITY): Payer: Medicare Other

## 2012-12-03 LAB — CBC
HCT: 23.6 % — ABNORMAL LOW (ref 36.0–46.0)
MCHC: 31.8 g/dL (ref 30.0–36.0)
MCV: 83.4 fL (ref 78.0–100.0)
Platelets: 113 10*3/uL — ABNORMAL LOW (ref 150–400)
RDW: 17 % — ABNORMAL HIGH (ref 11.5–15.5)

## 2012-12-03 LAB — BASIC METABOLIC PANEL
CO2: 22 mEq/L (ref 19–32)
Chloride: 110 mEq/L (ref 96–112)
Creatinine, Ser: 1.2 mg/dL — ABNORMAL HIGH (ref 0.50–1.10)
GFR calc Af Amer: 49 mL/min — ABNORMAL LOW (ref >90)
Sodium: 141 mEq/L (ref 135–145)

## 2012-12-03 LAB — CARBOXYHEMOGLOBIN
Methemoglobin: 0.2 % (ref 0.0–1.5)
O2 Saturation: 97.3 %
Total hemoglobin: 7.4 g/dL — ABNORMAL LOW (ref 12.0–16.0)

## 2012-12-03 NOTE — Procedures (Signed)
PICC exchange No complication No blood loss. See complete dictation in [facility].

## 2012-12-03 NOTE — Progress Notes (Signed)
Advanced Heart Failure Team Rounding Note  Primary Cardiologist:  DB Reason for Consultation: Respiratory failure HPI:    [name] is a 79 year old AA female with of pulseless electrical activity arrest, COPD, right heart failure secondary to cor pulmonale, pulmonary arterial hypertension (multifactorial but COPD plays a role), chronic respiratory failure, multifocal atrial tachycardia, peripheral arterial disease status post aortobifem bypass.  RHC 11/28/12 Done on milrinone 0.375 mcg/kg/min  RA = 13  RV = 74/0/15  PA = 88/28 (47)  R lower PCW = 27  R upper PCW = 20  L upper PCW = 12  Fick cardiac output/index = 5.6/3.7  PVR = 6.3 Woods  Ao sat = 94%  PA sat = 69%, 70%  SVC sat = 62%  Echo 01/08/12: LVEF 60-70%. Ventricular septum showed septal bounce. RV mod-severely dilated. Atrial septum bowed right to left. PASP 82 mmHG.  Echo (2/14): EF 55-60%, RV dilated with mild to moderate systolic dysfunction, D-shaped septum, PA systolic pressure 83 mmHg, severe TR.   CT chest (non contrast): Cardiomegaly with prominent interstitial markings and bronchial  wall thickening, likely emphysema with superimposed acute or  chronic bronchitis.  VQ scan no evidence of PE. There was equal perfusion to both lungs despite regional differences in PCWP.    Lengthy discussion with patient and family yesterday about prognosis. They decided they did not want to go to [facility] for trial of Flolan and want to try Tyvaso here. NO weaned down to 2 overnight and still remains on 7 liters [place]. Still on milrinone 0.375. Weight down 1 lb.  Swan pressures measured personally CVP 3 PAP 62/34 PCWP 14  Co-ox 97%, do not believe. Cr 1.2. Drop in Hgb from 9.0 to 7.5  Denies any SOB except with turning. No CP.  Objective:    Vital Signs:   Temp:  [98.1 F (36.7 C)-99.5 F (37.5 C)] 98.1 F (36.7 C) (08/20 0700) Pulse Rate:  [101-124] 101 (08/20 0700) Resp:  [13-28] 16 (08/20 0700) BP: (94-166)/(58-106) 114/73  mmHg (08/20 0700) SpO2:  [86 %-100 %] 99 % (08/20 0700) Weight:  [109 lb 12.6 oz (49.8 kg)] 109 lb 12.6 oz (49.8 kg) (08/20 0500) Last BM Date: 12/02/12  Weight change: Filed Weights   12/01/12 0500 12/02/12 0500 12/03/12 0500  Weight: 108 lb 7.5 oz (49.2 kg) 110 lb 7.2 oz (50.1 kg) 109 lb 12.6 oz (49.8 kg)    Intake/Output:   Intake/Output Summary (Last 24 hours) at 12/03/12 0813 Last data filed at 12/03/12 0700  Gross per 24 hour  Intake 1296.5 ml  Output    875 ml  Net  421.5 ml     Physical Exam: General: Thin. Frail On nasal cannula with NO; daughter present HEENT: normal  Neck: supple. JVP to <5 Carotids 2+ bilaterally; no bruits. No lymphadenopathy or thryomegaly appreciated; R cordis Cor: PMI normal.Tachycardic, regular rhythm. Loud TR murmur  +prominent s2. +RV lift  Lungs: Decreased breath sounds throughout, occasional rhonchi, no wheezes  Abdomen: soft, nontender, nondistended. No hepatosplenomegaly. No bruits or masses. Good bowel sounds.  Extremities: no cyanosis, clubbing, rash, edema. RUE PICC  Neuro: alert & orientedx3, cranial nerves grossly intact. Moves all 4 extremities w/o difficulty. Affect pleasant.  Telemetry: Sinus tach  Labs: Basic Metabolic Panel:  Recent Labs Lab 11/27/12 0425  11/29/12 0500 11/30/12 0500 12/01/12 0500 12/02/12 0500 12/03/12 0500  NA 139  < > 137 139 137 144 141  K 4.0  < > 4.6 3.7 3.8 4.3  3.8  CL 106  < > 106 103 105 114* 110  CO2 21  < > 23 27 25 23 22   GLUCOSE 146*  < > 103* 93 90 93 99  BUN 24*  < > 25* 23 17 14 9   CREATININE 2.04*  < > 1.35* 1.35* 1.25* 1.19* 1.20*  CALCIUM 8.9  < > 8.5 8.1* 7.9* 8.3* 8.5  MG 2.0  --   --   --   --   --   --   < > = values in this interval not displayed.  Liver Function Tests:  Recent Labs Lab 11/27/12 0425  AST 20  ALT 15  ALKPHOS 73  BILITOT 0.4  PROT 6.0  ALBUMIN 3.2*   No results found for this basename: LIPASE, AMYLASE,  in the last 168 hours No results found  for this basename: AMMONIA,  in the last 168 hours  CBC:  Recent Labs Lab 11/27/12 0425 11/27/12 1610 12/03/12 0500  WBC 5.1 7.8 5.2  NEUTROABS 4.6  --   --   HGB 8.3* 9.0* 7.5*  HCT 25.9* 27.5* 23.6*  MCV 83.3 82.8 83.4  PLT 225 254 113*    Cardiac Enzymes: No results found for this basename: CKTOTAL, CKMB, CKMBINDEX, TROPONINI,  in the last 168 hours  BNP: BNP (last 3 results)  Recent Labs  01/07/12 2229 10/28/12 1418 11/25/12 2231  PROBNP 1049.0* 3408.0* 5459.0*    CBG: No results found for this basename: GLUCAP,  in the last 168 hours  Coagulation Studies: No results found for this basename: LABPROT, INR,  in the last 72 hours  Other results:  Imaging: No results found.   Medications:     Current Medications: . albuterol  2.5 mg Nebulization TID   And  . ipratropium  0.5 mg Nebulization TID  . antiseptic oral rinse  15 mL Mouth Rinse BID  . aspirin EC  81 mg Oral Daily  . atorvastatin  40 mg Oral QHS  . budesonide-formoterol  2 puff Inhalation BID  . heparin  5,000 Units Subcutaneous Q8H  . heparin  5,000 Units Intravenous Once  . levothyroxine  125 mcg Oral QAC breakfast  . magnesium oxide  400 mg Oral Daily  . multivitamin with minerals  1 tablet Oral Daily  . pantoprazole  40 mg Oral Daily  . sodium chloride  10-40 mL Intracatheter Q12H  . Tadalafil (PAH)  20 mg Oral BID    Infusions: . sodium chloride 20 mL/hr at 12/03/12 0700  . sodium chloride 20 mL/hr at 12/03/12 0700  . milrinone 0.376 mcg/kg/min (12/03/12 0700)     Assessment:   1. Acute on chronic respiratory failure 2. Cor pulmonale, acute on chronic 3. Acute on chronic diastolic HF. 4. Severe COPD 5. Mixed PAH 6. PAH 7. Acute renal failure  Plan/Discussion:    Lengthy discussion with patient and family yesterday about her end-stage PAH/lung disease. They have decided to not trial Flolan at [facility] and would like to try Tyvaso. Oxygenation remains tenuous with minimal  activity.  Will discontinue SGC today and wean NO off once Tyvaso started.   Will get Palliative Care consult today.  Prognosis guarded, at best.   Length of Stay: 8  Aundria Rud, NP C 8:13 AM  Patient seen and examined with Ulla Potash, NP. We discussed all aspects of the encounter. I agree with the assessment and plan as stated above.   Pulmonary pressures stable with NO wean. Will stop completely. Have  ordered tyvaso. Will try to d/c Swan later to day and get out of bed with PT. Watch hemoglobin. Give a dose of demadex tomorrow. Palliative care team to see today.   Daniel Bensimhon,MD 9:00 AM

## 2012-12-03 NOTE — Progress Notes (Signed)
At 01:30 pt.'s SAT's began to drop into the low 80's. Pt. Was placed on a 50% VM with 6L O2 via [place]. Pt.'s SAT's increased to 100% on VM & [place]. Pt. Was taken off VM & left on 6L [place] & SAT's immediately fell back into the low 80's. Nitric was increased to 5 ppm & MD was made aware & was okay with increasing nitric from 3 ppm to 5 ppm. Pt.'s SAT's immediately increased from 83% to 96% after being placed on 5 ppm. Pt.'s PAP's were 65/33 with a mean of 44 at that time. MD & RN are both aware.

## 2012-12-03 NOTE — Progress Notes (Signed)
Thank you for consulting the Palliative Medicine Team at [facility] to meet your patient's and family's needs.   The reason that you asked [place] to see your patient is for goals of care discussion  We have scheduled your patient for a meeting: "Tentative" meeting time for tomorrow Thursday 12/04/12 @ 11-11:30 see below  The Surrogate decision maker is: daughter Leslie Dales   [phone]  Other family members that need to be present: per daughter [name] she has siblings that will be coming into town today  Your patient is able to participate  -alert and engaged in discussion during this visit; patient voiced she was not sure she wanted to meet with the PMT; daughter wants to continue to talk with her mother this afternoon and asked that the meeting be tentatively scheduled to allow for other family to participate and daughter [name] will call PMT phone to confirm patient and family are in agreement to meeting; [name] spoke with this writer away from patient to inform that her mother has shared with her and her brother that she is very fearful of dying and 'next steps' - discussed with [name] and again with patient that this conversation is a starting point and can help explore her goals and fears  Meeting tentative for tomorrow, Thursday as noted above -will update once daughter calls back to PMT phone.  Valente David, RN 12/03/2012, 12:51 PM Palliative Medicine Team RN Liaison [phone]

## 2012-12-04 LAB — CBC
HCT: 24.5 % — ABNORMAL LOW (ref 36.0–46.0)
Hemoglobin: 7.6 g/dL — ABNORMAL LOW (ref 12.0–15.0)
MCH: 25.9 pg — ABNORMAL LOW (ref 26.0–34.0)
MCHC: 31 g/dL (ref 30.0–36.0)

## 2012-12-04 LAB — BASIC METABOLIC PANEL
BUN: 11 mg/dL (ref 6–23)
Creatinine, Ser: 1.33 mg/dL — ABNORMAL HIGH (ref 0.50–1.10)
GFR calc Af Amer: 43 mL/min — ABNORMAL LOW (ref >90)
GFR calc non Af Amer: 37 mL/min — ABNORMAL LOW (ref >90)
Potassium: 4.1 mEq/L (ref 3.5–5.1)

## 2012-12-04 LAB — CARBOXYHEMOGLOBIN: Total hemoglobin: 7.7 g/dL — ABNORMAL LOW (ref 12.0–16.0)

## 2012-12-04 MED ORDER — TORSEMIDE 20 MG PO TABS
20.0000 mg | ORAL_TABLET | Freq: Once | ORAL | Status: AC
  Administered 2012-12-04: 20 mg via ORAL
  Filled 2012-12-04: qty 1

## 2012-12-04 MED ORDER — ALPRAZOLAM 0.5 MG PO TABS
0.5000 mg | ORAL_TABLET | ORAL | Status: DC | PRN
  Administered 2012-12-05 – 2012-12-09 (×5): 0.5 mg via ORAL
  Filled 2012-12-04 (×5): qty 1

## 2012-12-04 MED ORDER — ENOXAPARIN SODIUM 30 MG/0.3ML ~~LOC~~ SOLN
30.0000 mg | SUBCUTANEOUS | Status: DC
  Filled 2012-12-04: qty 0.3

## 2012-12-04 MED ORDER — MORPHINE SULFATE (CONCENTRATE) 10 MG /0.5 ML PO SOLN
2.5000 mg | ORAL | Status: DC | PRN
  Administered 2012-12-06 – 2012-12-09 (×4): 5 mg via ORAL
  Filled 2012-12-04 (×4): qty 0.5

## 2012-12-04 MED ORDER — ENOXAPARIN SODIUM 30 MG/0.3ML ~~LOC~~ SOLN
20.0000 mg | SUBCUTANEOUS | Status: DC
  Administered 2012-12-04: 21:00:00 via SUBCUTANEOUS
  Administered 2012-12-05 – 2012-12-09 (×5): 20 mg via SUBCUTANEOUS
  Filled 2012-12-04 (×12): qty 0.2

## 2012-12-04 NOTE — Progress Notes (Signed)
PMT follow-up- spoke with patient's daughter [name] this morning and she is requesting PMT discussion be moved to 2:00 pm today; she stated her mother is still "having second thoughts about this meeting and I will talk to her again this morning when I arrive at the hospital" [name] stated "my sister and I need to have this discussion, my mother thinks we know what she wants but we don't- even if she just listens, this will be helpful to [place] "  PMT meeting re-scheduled per daughter request for 2:00 pm today Thursday 12/04/12  Valente David, RN 12/04/2012, 9:31 AM Palliative Medicine Team RN Liaison [phone]

## 2012-12-04 NOTE — Progress Notes (Signed)
Met with Mrs. Stabenow and two of her daughters. Despite her Apprehension we had a very good conversation about her illness, goals of care and possible trajectories of her disease based on some of the choice and options she can make. We kept the focus of our conversation on determining how she lives because she already understand that she is dying and has been for quite some time with severe cardiopulmonary disease. While she is tolerating her current level of suffering she wants to be at home and she wants relief from the shortness of breath.  Summary of Goals:  1. DNR (with the exception of Milrinone as a pressor) 2. Continue to maximiize her medical therapies and interventions-she wants to continue Milrinone as long as it is helping and wants to try the new inhaled drug for her PAH. 3. In addition to aggressive medical intervetions she wants a focus on her comfort- ie.   Recommendations:   DNR but continue Milrinone, if acutely decompensates escalate interventions if they contribute to comfort only.  Transition out of the ICU as soon as possible into a less hectic environment- given her age and co-morbidities she is indeed at risk for delirium-would be reasonable to move her out of ICU, get her off tele yet continue to work on her diuresis and breathing comfort. She needs a good nights sleep and to regulate her sleep wake cycles- exhaustion will contribute to her desaturation as well. May consider setting monitor alarms at lower threshold-I suspect she was desating significantly at home frequently prior to admission. I discussed treating her dyspnea but not her "numbers". Will order very low dose morphine conc. trial prn for dyspnea.  Changed ativan to alprazolam for air hunger-shorter half life and faster onset when air hunger or agitation occurs. Will also help with sleep.  She is now very interested in Hospice Care upon discharge- the logistics of this may be difficult but I will begin working  on them with my team -she is in [place]- need to make sure Hospice there will take a Milrinone infusion. Home is her goal-family are going to obtain caregivers to make that happen for her if needed-she is very deconditioned.  Will continue to follow- thank you for this consultation. Detailed consult note to follow.  Anderson Malta, DO Palliative Medicine

## 2012-12-04 NOTE — Progress Notes (Signed)
Advanced Heart Failure Team Rounding Note  Primary Cardiologist:  DB Reason for Consultation: Respiratory failure HPI:    Ms. Anisah is a 79 year old AA female with of pulseless electrical activity arrest, COPD, right heart failure secondary to cor pulmonale, pulmonary arterial hypertension (multifactorial but COPD plays a role), chronic respiratory failure, multifocal atrial tachycardia, peripheral arterial disease status post aortobifem bypass.  RHC 11/28/12 Done on milrinone 0.375 mcg/kg/min  RA = 13  RV = 74/0/15  PA = 88/28 (47)  R lower PCW = 27  R upper PCW = 20  L upper PCW = 12  Fick cardiac output/index = 5.6/3.7  PVR = 6.3 [place]  Ao sat = 94%  PA sat = 69%, 70%  SVC sat = 62%  Echo 01/08/12: LVEF 60-70%. Ventricular septum showed septal bounce. RV mod-severely dilated. Atrial septum bowed right to left. PASP 82 mmHG.  Echo (2/14): EF 55-60%, RV dilated with mild to moderate systolic dysfunction, D-shaped septum, PA systolic pressure 83 mmHg, severe TR.   CT chest (non contrast): Cardiomegaly with prominent interstitial markings and bronchial  wall thickening, likely emphysema with superimposed acute or  chronic bronchitis.  VQ scan no evidence of PE. There was equal perfusion to both lungs despite regional differences in PCWP.    Family decided to stay at [facility] and  not to [facility] for trial of Flolan and want to try Tyvaso here. Yesterday swan removed and nitric oxygen stopped.  Still on milrinone 0.375. O2 sats drops into the 60s when she moves in bed. Complaining of fatigue and dyspnea with movement.   Co-ox 51% CVP 14 (checked personally)  Objective:    Vital Signs:   Temp:  [98 F (36.7 C)-98.9 F (37.2 C)] 98.7 F (37.1 C) (08/21 0754) Pulse Rate:  [102-117] 109 (08/21 0700) Resp:  [14-29] 16 (08/21 0700) BP: (97-136)/(59-86) 136/86 mmHg (08/21 0700) SpO2:  [85 %-100 %] 97 % (08/21 0700) Weight:  [103 lb 2.8 oz (46.8 kg)] 103 lb 2.8 oz (46.8 kg) (08/21 0500) Last  BM Date: 12/02/12  Weight change: Filed Weights   12/02/12 0500 12/03/12 0500 12/04/12 0500  Weight: 110 lb 7.2 oz (50.1 kg) 109 lb 12.6 oz (49.8 kg) 103 lb 2.8 oz (46.8 kg)    Intake/Output:   Intake/Output Summary (Last 24 hours) at 12/04/12 0759 Last data filed at 12/04/12 0700  Gross per 24 hour  Intake    795 ml  Output    800 ml  Net     -5 ml     Physical Exam: CVP 14  General: Thin. Frail On nasal cannula with NO; daughter present HEENT: normal  Neck: supple. JVP to ear Carotids 2+ bilaterally; no bruits. No lymphadenopathy or thryomegaly appreciated; R cordis Cor: PMI normal.Tachycardic, regular rhythm. Loud TR murmur  +prominent s2. +RV lift  Lungs:6 liters Decreased breath sounds throughout, occasional rhonchi, no wheezes  Abdomen: soft, nontender, nondistended. No hepatosplenomegaly. No bruits or masses. Good bowel sounds.  Extremities: no cyanosis, clubbing, rash, edema. RUE PICC  Neuro: alert & orientedx3, cranial nerves grossly intact. Moves all 4 extremities w/o difficulty. Affect pleasant.  Telemetry: Sinus tach  Labs: Basic Metabolic Panel:  Recent Labs Lab 11/30/12 0500 12/01/12 0500 12/02/12 0500 12/03/12 0500 12/04/12 0500  NA 139 137 144 141 139  K 3.7 3.8 4.3 3.8 4.1  CL 103 105 114* 110 109  CO2 27 25 23 22 21   GLUCOSE 93 90 93 99 111*  BUN 23 17 14  9 11  CREATININE 1.35* 1.25* 1.19* 1.20* 1.33*  CALCIUM 8.1* 7.9* 8.3* 8.5 8.7    Liver Function Tests: No results found for this basename: AST, ALT, ALKPHOS, BILITOT, PROT, ALBUMIN,  in the last 168 hours No results found for this basename: LIPASE, AMYLASE,  in the last 168 hours No results found for this basename: AMMONIA,  in the last 168 hours  CBC:  Recent Labs Lab 11/27/12 1610 12/03/12 0500 12/04/12 0500  WBC 7.8 5.2 6.2  HGB 9.0* 7.5* 7.6*  HCT 27.5* 23.6* 24.5*  MCV 82.8 83.4 83.6  PLT 254 113* 116*    Cardiac Enzymes: No results found for this basename: CKTOTAL, CKMB,  CKMBINDEX, TROPONINI,  in the last 168 hours  BNP: BNP (last 3 results)  Recent Labs  01/07/12 2229 10/28/12 1418 11/25/12 2231  PROBNP 1049.0* 3408.0* 5459.0*    CBG: No results found for this basename: GLUCAP,  in the last 168 hours  Coagulation Studies: No results found for this basename: LABPROT, INR,  in the last 72 hours  Other results:  Imaging: Ir Fluoro Guide Cv Line Right  12/03/2012   * RADIOLOGY REPORT *  Clinical data: Occlusion of 2 of the three lumens of the indwelling right arm PICC.  Continued access is needed. Pulmonary arterial hypertension.  PICC EXCHANGE WITH   FLUOROSCOPY  Technique: After written informed consent was obtained, patient was placed in the supine position on angiographic table. Skin entry site and surrounding of the right arm PICC was prepped using maximum barrier technique including cap and mask, sterile gown, sterile gloves, large sterile sheet, and Chlorhexidine   as cutaneous antisepsis.  The region was infiltrated locally with 1% lidocaine. Previously placed catheter was partially withdrawn, cut, and exchanged over a 018 guidewire for a peel-away sheath, through which a 5-French double-lumen power injectable PICC trimmed to 35cm was advanced, positioned with its tip near the cavoatrial junction. Spot chest radiograph confirms appropriate catheter position. Catheter was flushed per protocol and secured externally with 0- Prolene sutures.  The patient tolerated procedure well, with no immediate complication.  Fluoroscopy time: 36 seconds  IMPRESSION: Technically successful five [place] double lumen power injectable PICC placement   Original Report Authenticated By: D. Andria Rhein, MD     Medications:     Current Medications: . albuterol  2.5 mg Nebulization TID   And  . ipratropium  0.5 mg Nebulization TID  . antiseptic oral rinse  15 mL Mouth Rinse BID  . aspirin EC  81 mg Oral Daily  . atorvastatin  40 mg Oral QHS  . budesonide-formoterol   2 puff Inhalation BID  . heparin  5,000 Units Subcutaneous Q8H  . levothyroxine  125 mcg Oral QAC breakfast  . magnesium oxide  400 mg Oral Daily  . multivitamin with minerals  1 tablet Oral Daily  . pantoprazole  40 mg Oral Daily  . sodium chloride  10-40 mL Intracatheter Q12H  . Tadalafil (PAH)  20 mg Oral BID    Infusions: . sodium chloride 15 mL/hr at 12/04/12 0700  . sodium chloride Stopped (12/04/12 0625)  . milrinone 0.376 mcg/kg/min (12/04/12 0700)     Assessment:   1. Acute on chronic respiratory failure 2. Cor pulmonale, acute on chronic 3. Acute on chronic diastolic HF. 4. COPD 5. Mixed PAH 6. PAH 7. Acute renal failure  Plan/Discussion:   Plan to start Tyvaso today. She declines  trial of Flolan at [facility]. Oxygenation remains tenuous with minimal activity.  Weight down 6 pounds. Renal function ok.    Palliative Care consult today at 11:30.  Prognosis guarded, at best.   Length of Stay: 9  CLEGG,AMY, NP C 7:59 AM  Patient seen and examined with [name], NP. We discussed all aspects of the encounter. I agree with the assessment and plan as stated above.   Continues to deteriorate. CVP worse off of nitric. Remains severely hypoxemic with any movement and requires high flow O2. Will give a dose of demadex today. Palliative care team to see today I am not sure how we are going to oxygenate her at home. I will have PT see her but not sure they will be able to do much with her sats the way they are.   Transfer to stepdown for now. Likely nearing point where comfort care is only option but given hemodynamic response to NO may be worth Trial of Tyvaso.   Daniel Bensimhon,MD 8:29 AM

## 2012-12-05 LAB — BASIC METABOLIC PANEL
BUN: 13 mg/dL (ref 6–23)
CO2: 22 mEq/L (ref 19–32)
Chloride: 105 mEq/L (ref 96–112)
Creatinine, Ser: 1.51 mg/dL — ABNORMAL HIGH (ref 0.50–1.10)

## 2012-12-05 LAB — CARBOXYHEMOGLOBIN: Methemoglobin: 0.5 % (ref 0.0–1.5)

## 2012-12-05 NOTE — Progress Notes (Signed)
Palliative Medicine Team SW Referred for psychosocial support to pt and family. Met with Pt and dtr Honey at bedside. Pt in bright spirits, reports good coping. Pt states "I'm really ok, I just don't want to talk about this anymore. We've had the discussion that we're thinking things through". Pt reports good social and spiritual support. Validated pt's feelings, left my contact info if needs arise in the meantime.   Kennieth Francois, LCSW PMT phone [phone]

## 2012-12-05 NOTE — Progress Notes (Addendum)
Advanced Heart Failure Team Rounding Note  Primary Cardiologist:  DB Reason for Consultation: Respiratory failure HPI:    [name] is a 79 year old AA female with end-stage PAH, RHF admitted with recurrent respiratory failure.   Remains very hypoxic even with high-flow O2. Seen by Palliative Care yesterday. Now DNR/DNI. Interested in Hospice  Able to rest last night. Continues to desat with minimal exertion. Otherwise she says is OK. Wants to go home to [place].   Co-ox 51>40% CVP 12  Objective:    Vital Signs:   Temp:  [98 F (36.7 C)-98.7 F (37.1 C)] 98 F (36.7 C) (08/21 1900) Pulse Rate:  [83-120] 106 (08/22 0600) Resp:  [15-26] 22 (08/22 0600) BP: (89-136)/(45-91) 114/75 mmHg (08/21 2100) SpO2:  [74 %-100 %] 98 % (08/22 0600) Weight:  [105 lb 9.6 oz (47.9 kg)] 105 lb 9.6 oz (47.9 kg) (08/22 0500) Last BM Date: 12/02/12  Weight change: Filed Weights   12/04/12 0500 12/05/12 0401 12/05/12 0500  Weight: 103 lb 2.8 oz (46.8 kg) 105 lb 9.6 oz (47.9 kg) 105 lb 9.6 oz (47.9 kg)    Intake/Output:   Intake/Output Summary (Last 24 hours) at 12/05/12 0629 Last data filed at 12/05/12 0600  Gross per 24 hour  Intake  616.5 ml  Output   1580 ml  Net -963.5 ml     Physical Exam: CVP 12 General: Thin. Frail On nasal cannula with NO; daughter present HEENT: normal  Neck: supple. JVP to ear Carotids 2+ bilaterally; no bruits. No lymphadenopathy or thryomegaly appreciated; R cordis Cor: PMI normal.Tachycardic, regular rhythm. Loud TR murmur  +prominent s2. +RV lift  Lungs:6 liters Decreased breath sounds throughout, occasional rhonchi, no wheezes  Abdomen: soft, nontender, nondistended. No hepatosplenomegaly. No bruits or masses. Good bowel sounds.  Extremities: no cyanosis, clubbing, rash, edema. RUE PICC  Neuro: alert & orientedx3, cranial nerves grossly intact. Moves all 4 extremities w/o difficulty. Affect pleasant.  Telemetry: Sinus tach 100s  Labs: Basic  Metabolic Panel:  Recent Labs Lab 12/01/12 0500 12/02/12 0500 12/03/12 0500 12/04/12 0500 12/05/12 0355  NA 137 144 141 139 138  K 3.8 4.3 3.8 4.1 3.9  CL 105 114* 110 109 105  CO2 25 23 22 21 22   GLUCOSE 90 93 99 111* 100*  BUN 17 14 9 11 13   CREATININE 1.25* 1.19* 1.20* 1.33* 1.51*  CALCIUM 7.9* 8.3* 8.5 8.7 8.6    Liver Function Tests: No results found for this basename: AST, ALT, ALKPHOS, BILITOT, PROT, ALBUMIN,  in the last 168 hours No results found for this basename: LIPASE, AMYLASE,  in the last 168 hours No results found for this basename: AMMONIA,  in the last 168 hours  CBC:  Recent Labs Lab 12/03/12 0500 12/04/12 0500  WBC 5.2 6.2  HGB 7.5* 7.6*  HCT 23.6* 24.5*  MCV 83.4 83.6  PLT 113* 116*    Cardiac Enzymes: No results found for this basename: CKTOTAL, CKMB, CKMBINDEX, TROPONINI,  in the last 168 hours  BNP: BNP (last 3 results)  Recent Labs  01/07/12 2229 10/28/12 1418 11/25/12 2231  PROBNP 1049.0* 3408.0* 5459.0*    CBG:  Recent Labs Lab 12/04/12 1156  GLUCAP 126*    Coagulation Studies: No results found for this basename: LABPROT, INR,  in the last 72 hours  Other results:  Imaging: Ir Fluoro Guide Cv Line Right  12/03/2012   * RADIOLOGY REPORT *  Clinical data: Occlusion of 2 of the three lumens of the  indwelling right arm PICC.  Continued access is needed. Pulmonary arterial hypertension.  PICC EXCHANGE WITH   FLUOROSCOPY  Technique: After written informed consent was obtained, patient was placed in the supine position on angiographic table. Skin entry site and surrounding of the right arm PICC was prepped using maximum barrier technique including cap and mask, sterile gown, sterile gloves, large sterile sheet, and Chlorhexidine   as cutaneous antisepsis.  The region was infiltrated locally with 1% lidocaine. Previously placed catheter was partially withdrawn, cut, and exchanged over a 018 guidewire for a peel-away sheath, through  which a 5-French double-lumen power injectable PICC trimmed to 35cm was advanced, positioned with its tip near the cavoatrial junction. Spot chest radiograph confirms appropriate catheter position. Catheter was flushed per protocol and secured externally with 0- Prolene sutures.  The patient tolerated procedure well, with no immediate complication.  Fluoroscopy time: 36 seconds  IMPRESSION: Technically successful five [place] double lumen power injectable PICC placement   Original Report Authenticated By: D. Andria Rhein, MD     Medications:     Current Medications: . albuterol  2.5 mg Nebulization TID   And  . ipratropium  0.5 mg Nebulization TID  . antiseptic oral rinse  15 mL Mouth Rinse BID  . aspirin EC  81 mg Oral Daily  . atorvastatin  40 mg Oral QHS  . budesonide-formoterol  2 puff Inhalation BID  . enoxaparin (LOVENOX) injection  20 mg Subcutaneous Q24H  . levothyroxine  125 mcg Oral QAC breakfast  . magnesium oxide  400 mg Oral Daily  . multivitamin with minerals  1 tablet Oral Daily  . pantoprazole  40 mg Oral Daily  . sodium chloride  10-40 mL Intracatheter Q12H  . Tadalafil (PAH)  20 mg Oral BID    Infusions: . sodium chloride 10 mL/hr (12/04/12 1014)  . milrinone 0.376 mcg/kg/min (12/04/12 0700)     Assessment:   1. Acute on chronic respiratory failure 2. Cor pulmonale, acute on chronic 3. Acute on chronic diastolic HF. 4. COPD 5. Mixed PAH 6. PAH 7. Acute renal failure 8. DNR  Plan/Discussion:   Discussed with Dr Gala Romney. CO-OX down to 40%. Will not start Tyvaso as she continues to decline.  Weight up 2 pounds. CVP 12. Give 20 mg Torsemide. Renal function ok.    Palliative Care consult appreciated. DNR.  Plan to transition to Hospice when ready for discharge.  Transfer to stepdown.   Prognosis guarded, at best.   Length of Stay: 10  CLEGG,AMY, NP C 6:29 AM  Patient seen and examined with [name], NP. We discussed all aspects of the encounter.  I agree with the assessment and plan as stated above. She continues to deteriorate. Long talk with her and family about options. She is adamant that she wants to go back to [place]. [facility] [place] unable to provided milrinone. So options are home to [facility] with [facility] and continued milrinone vs home with Hospice off milrinone. They will discuss further and I will ask Dr. Phillips Odor for her opinion. I would like to keep her on milrinone if possible for comfort but I think Hospice Care is the most important component for her at this time.  Will transfer to SDU.   Daniel Bensimhon,MD 11:42 AM  Addendum: Had another discussion with patient and family. They want to see if she can tolerate being off milrinone so she can get Hospice in [place]. They are also considering inpatient Hospice. Will decrease milrinone to 0.125.  Reuel Boom  Bensimhon,MD 12:46 PM

## 2012-12-06 LAB — BASIC METABOLIC PANEL
CO2: 24 mEq/L (ref 19–32)
Chloride: 108 mEq/L (ref 96–112)
Potassium: 4.3 mEq/L (ref 3.5–5.1)
Sodium: 140 mEq/L (ref 135–145)

## 2012-12-06 LAB — CARBOXYHEMOGLOBIN
Carboxyhemoglobin: 1.5 % (ref 0.5–1.5)
Methemoglobin: 1.2 % (ref 0.0–1.5)
O2 Saturation: 54.9 %
Total hemoglobin: 7.3 g/dL — ABNORMAL LOW (ref 12.0–16.0)

## 2012-12-06 LAB — CBC
HCT: 26.4 % — ABNORMAL LOW (ref 36.0–46.0)
MCV: 83.5 fL (ref 78.0–100.0)
RDW: 17.1 % — ABNORMAL HIGH (ref 11.5–15.5)
WBC: 5 10*3/uL (ref 4.0–10.5)

## 2012-12-06 LAB — PREPARE RBC (CROSSMATCH)

## 2012-12-06 MED ORDER — TORSEMIDE 20 MG PO TABS
20.0000 mg | ORAL_TABLET | Freq: Once | ORAL | Status: DC
Start: 2012-12-06 — End: 2012-12-08
  Filled 2012-12-06: qty 1

## 2012-12-06 MED ORDER — FUROSEMIDE 10 MG/ML IJ SOLN
40.0000 mg | Freq: Once | INTRAMUSCULAR | Status: AC
  Administered 2012-12-06: 40 mg via INTRAVENOUS
  Filled 2012-12-06: qty 4

## 2012-12-06 NOTE — Progress Notes (Signed)
Advanced Heart Failure Team Rounding Note  Primary Cardiologist:  DB Reason for Consultation: Respiratory failure HPI:    79 y/o woman with end-stage RHF due to cor pulmonale.  Yesterday milrinone weaned down to 0.125. Hospice discussions ongoing.   Continues to desaturate into 60-70s with turning.   Co-ox 55% HGb down to 7.3  Objective:    Vital Signs:   Temp:  [98.1 F (36.7 C)-98.7 F (37.1 C)] 98.2 F (36.8 C) (08/23 0318) Pulse Rate:  [101-120] 101 (08/23 0318) Resp:  [14-30] 18 (08/23 0318) BP: (88-105)/(50-69) 105/62 mmHg (08/23 0318) SpO2:  [84 %-97 %] 97 % (08/23 0733) Weight:  [49.8 kg (109 lb 12.6 oz)] 49.8 kg (109 lb 12.6 oz) (08/23 0500) Last BM Date: 12/04/12  Weight change: Filed Weights   12/05/12 0401 12/05/12 0500 12/06/12 0500  Weight: 47.9 kg (105 lb 9.6 oz) 47.9 kg (105 lb 9.6 oz) 49.8 kg (109 lb 12.6 oz)    Intake/Output:   Intake/Output Summary (Last 24 hours) at 12/06/12 0753 Last data filed at 12/06/12 0600  Gross per 24 hour  Intake 672.86 ml  Output     50 ml  Net 622.86 ml     Physical Exam: CVP 14  General: Thin. Frail On nasal cannula with NO; daughter present HEENT: normal  Neck: supple. JVP to jaw Carotids 2+ bilaterally; no bruits. No lymphadenopathy or thryomegaly appreciated; R cordis Cor: PMI normal.Tachycardic, regular rhythm. Loud TR murmur  +prominent s2. +RV lift  Lungs:6 liters Decreased breath sounds throughout, occasional rhonchi, no wheezes  Abdomen: soft, nontender, nondistended. No hepatosplenomegaly. No bruits or masses. Good bowel sounds.  Extremities: no cyanosis, clubbing, rash, edema. RUE PICC  Neuro: alert & orientedx3, cranial nerves grossly intact. Moves all 4 extremities w/o difficulty. Affect pleasant.  Telemetry: Sinus tach  Labs: Basic Metabolic Panel:  Recent Labs Lab 12/02/12 0500 12/03/12 0500 12/04/12 0500 12/05/12 0355 12/06/12 0500  NA 144 141 139 138 140  K 4.3 3.8 4.1 3.9 4.3  CL 114*  110 109 105 108  CO2 23 22 21 22 24   GLUCOSE 93 99 111* 100* 108*  BUN 14 9 11 13 15   CREATININE 1.19* 1.20* 1.33* 1.51* 1.44*  CALCIUM 8.3* 8.5 8.7 8.6 8.7    Liver Function Tests: No results found for this basename: AST, ALT, ALKPHOS, BILITOT, PROT, ALBUMIN,  in the last 168 hours No results found for this basename: LIPASE, AMYLASE,  in the last 168 hours No results found for this basename: AMMONIA,  in the last 168 hours  CBC:  Recent Labs Lab 12/03/12 0500 12/04/12 0500  WBC 5.2 6.2  HGB 7.5* 7.6*  HCT 23.6* 24.5*  MCV 83.4 83.6  PLT 113* 116*    Cardiac Enzymes: No results found for this basename: CKTOTAL, CKMB, CKMBINDEX, TROPONINI,  in the last 168 hours  BNP: BNP (last 3 results)  Recent Labs  01/07/12 2229 10/28/12 1418 11/25/12 2231  PROBNP 1049.0* 3408.0* 5459.0*    CBG:  Recent Labs Lab 12/04/12 1156  GLUCAP 126*    Coagulation Studies: No results found for this basename: LABPROT, INR,  in the last 72 hours  Other results:  Imaging: No results found.   Medications:     Current Medications: . albuterol  2.5 mg Nebulization TID   And  . ipratropium  0.5 mg Nebulization TID  . antiseptic oral rinse  15 mL Mouth Rinse BID  . aspirin EC  81 mg Oral Daily  . budesonide-formoterol  2 puff Inhalation BID  . enoxaparin (LOVENOX) injection  20 mg Subcutaneous Q24H  . levothyroxine  125 mcg Oral QAC breakfast  . magnesium oxide  400 mg Oral Daily  . multivitamin with minerals  1 tablet Oral Daily  . pantoprazole  40 mg Oral Daily  . sodium chloride  10-40 mL Intracatheter Q12H  . Tadalafil (PAH)  20 mg Oral BID    Infusions: . sodium chloride 10 mL/hr (12/04/12 1014)  . milrinone 0.125 mcg/kg/min (12/05/12 2230)     Assessment:   1. Acute on chronic respiratory failure 2. Cor pulmonale, acute on chronic 3. Acute on chronic diastolic HF. 4. COPD 5. Mixed PAH 6. PAH 7. Acute renal failure 8. Anemia of chronic disease 9.  DNR/DNI  Plan/Discussion:    No real change over night despite weaning milrinone down. Will stop milrinone today and see what happens. She said earlier in the week she would like to go back to [place] with Hospice but now one of her daughters is asking about lung transplant. I think they still haven't come to terms with the situation.  Hospice in [place] will not accept milrinone so will wean to off today and see how she does. Hgb quite low and will transfuse to help with oxygenation.   I think full comfort care is best approach but they are not there yet.   Deaaron Fulghum,MD 7:53 AM

## 2012-12-07 LAB — BASIC METABOLIC PANEL
BUN: 16 mg/dL (ref 6–23)
CO2: 23 mEq/L (ref 19–32)
Calcium: 8.9 mg/dL (ref 8.4–10.5)
Creatinine, Ser: 1.52 mg/dL — ABNORMAL HIGH (ref 0.50–1.10)
Glucose, Bld: 109 mg/dL — ABNORMAL HIGH (ref 70–99)

## 2012-12-07 LAB — CBC
HCT: 35.3 % — ABNORMAL LOW (ref 36.0–46.0)
HCT: 36.1 % (ref 36.0–46.0)
Hemoglobin: 11.8 g/dL — ABNORMAL LOW (ref 12.0–15.0)
MCH: 27.3 pg (ref 26.0–34.0)
MCH: 27.6 pg (ref 26.0–34.0)
MCHC: 32.7 g/dL (ref 30.0–36.0)
MCHC: 33.1 g/dL (ref 30.0–36.0)
MCV: 83.3 fL (ref 78.0–100.0)
Platelets: 128 10*3/uL — ABNORMAL LOW (ref 150–400)
RDW: 16.2 % — ABNORMAL HIGH (ref 11.5–15.5)

## 2012-12-07 LAB — TYPE AND SCREEN
ABO/RH(D): AB POS
Unit division: 0

## 2012-12-07 NOTE — Progress Notes (Signed)
Advanced Heart Failure Team Rounding Note  Primary Cardiologist:  DB Reason for Consultation: Respiratory failure HPI:    79 y/o woman with end-stage RHF due to cor pulmonale.  Milrinone stopped yesterday as [facility] would not accept her with it. She is upset about this. Doesn't notice much of a change in symptoms but co-ox down to 41%. Got 2 units RBCs yesterday. Weight stable.   Continues to desaturate into 70-80s with turning. Not overly dyspneic with this.    Objective:    Vital Signs:   Temp:  [97.4 F (36.3 C)-98 F (36.7 C)] 97.7 F (36.5 C) (08/24 0719) Pulse Rate:  [92-107] 99 (08/24 0719) Resp:  [17-27] 20 (08/24 0719) BP: (73-140)/(52-98) 110/78 mmHg (08/24 0719) SpO2:  [89 %-100 %] 92 % (08/24 0814) Weight:  [49.8 kg (109 lb 12.6 oz)] 49.8 kg (109 lb 12.6 oz) (08/24 0500) Last BM Date: 12/06/12  Weight change: Filed Weights   12/06/12 0500 12/07/12 0411 12/07/12 0500  Weight: 49.8 kg (109 lb 12.6 oz) 49.8 kg (109 lb 12.6 oz) 49.8 kg (109 lb 12.6 oz)    Intake/Output:   Intake/Output Summary (Last 24 hours) at 12/07/12 1221 Last data filed at 12/07/12 1100  Gross per 24 hour  Intake    720 ml  Output      0 ml  Net    720 ml     Physical Exam:  General: Thin. Frail On nasal cannula with NO; daughter present HEENT: normal  Neck: supple. JVP to jaw Carotids 2+ bilaterally; no bruits. No lymphadenopathy or thryomegaly appreciated; R cordis Cor: PMI normal.Tachycardic, regular rhythm. Loud TR murmur  +prominent s2. +RV lift  Lungs:6 liters Decreased breath sounds throughout, occasional rhonchi, no wheezes  Abdomen: soft, nontender, nondistended. No hepatosplenomegaly. No bruits or masses. Good bowel sounds.  Extremities: no cyanosis, clubbing, rash, edema. RUE PICC  Neuro: alert & orientedx3, cranial nerves grossly intact. Moves all 4 extremities w/o difficulty. Affect pleasant.  Telemetry: Sinus tach  Labs: Basic Metabolic  Panel:  Recent Labs Lab 12/03/12 0500 12/04/12 0500 12/05/12 0355 12/06/12 0500 12/07/12 0547  NA 141 139 138 140 137  K 3.8 4.1 3.9 4.3 4.4  CL 110 109 105 108 105  CO2 22 21 22 24 23   GLUCOSE 99 111* 100* 108* 109*  BUN 9 11 13 15 16   CREATININE 1.20* 1.33* 1.51* 1.44* 1.52*  CALCIUM 8.5 8.7 8.6 8.7 8.9    Liver Function Tests: No results found for this basename: AST, ALT, ALKPHOS, BILITOT, PROT, ALBUMIN,  in the last 168 hours No results found for this basename: LIPASE, AMYLASE,  in the last 168 hours No results found for this basename: AMMONIA,  in the last 168 hours  CBC:  Recent Labs Lab 12/03/12 0500 12/04/12 0500 12/06/12 0833 12/06/12 2355 12/07/12 0547  WBC 5.2 6.2 5.0 6.9 6.1  HGB 7.5* 7.6* 8.4* 11.7* 11.8*  HCT 23.6* 24.5* 26.4* 35.3* 36.1  MCV 83.4 83.6 83.5 83.3 83.4  PLT 113* 116* 137* 128* 129*    Cardiac Enzymes: No results found for this basename: CKTOTAL, CKMB, CKMBINDEX, TROPONINI,  in the last 168 hours  BNP: BNP (last 3 results)  Recent Labs  01/07/12 2229 10/28/12 1418 11/25/12 2231  PROBNP 1049.0* 3408.0* 5459.0*    CBG:  Recent Labs Lab 12/04/12 1156  GLUCAP 126*    Coagulation Studies: No results found for this basename: LABPROT, INR,  in the last 72 hours  Other results:  Imaging:  No results found.   Medications:     Current Medications: . albuterol  2.5 mg Nebulization TID   And  . ipratropium  0.5 mg Nebulization TID  . antiseptic oral rinse  15 mL Mouth Rinse BID  . aspirin EC  81 mg Oral Daily  . budesonide-formoterol  2 puff Inhalation BID  . enoxaparin (LOVENOX) injection  20 mg Subcutaneous Q24H  . levothyroxine  125 mcg Oral QAC breakfast  . magnesium oxide  400 mg Oral Daily  . multivitamin with minerals  1 tablet Oral Daily  . pantoprazole  40 mg Oral Daily  . sodium chloride  10-40 mL Intracatheter Q12H  . Tadalafil (PAH)  20 mg Oral BID  . torsemide  20 mg Oral Once    Infusions: .  sodium chloride 10 mL/hr at 12/06/12 1130     Assessment:   1. Acute on chronic respiratory failure 2. Cor pulmonale, acute on chronic 3. Acute on chronic diastolic HF. 4. COPD 5. Mixed PAH 6. PAH 7. Acute renal failure 8. Anemia of chronic disease 9. DNR/DNI  Plan/Discussion:    Symptomatically stable off milrinone but co-ox decreasing. We had long talk about the 3 options as [facility] will not accept milrinone  1) Stay in [place] with hospice and milrinone 2) Home to [facility] with hospice and no milrinone 3) Home to [facility] with HHRN and milrinone. No Hospice.  They will continue to discuss. Will leave milrinone off for now to see how she does without it. Will have another family meeting in the am to discuss.  Daniel Bensimhon,MD 12:21 PM

## 2012-12-07 NOTE — Progress Notes (Signed)
Pt was unable to swallow cardizem 180mg  capsule,despite several attempts. Pt's son was in the room and stated that patient cannot usually swallow capsules. Pharmacy states only available in capsule (ER). Informed son that cardizem at lower dose would be in tablet ,but 3 times a day ,and he is agreeable to that. PA Gene Serpe called and order changed.   Tammy Sours

## 2012-12-08 ENCOUNTER — Encounter: Payer: Self-pay | Admitting: Internal Medicine

## 2012-12-08 LAB — BASIC METABOLIC PANEL
BUN: 18 mg/dL (ref 6–23)
Calcium: 8.9 mg/dL (ref 8.4–10.5)
Creatinine, Ser: 1.4 mg/dL — ABNORMAL HIGH (ref 0.50–1.10)
GFR calc Af Amer: 41 mL/min — ABNORMAL LOW (ref >90)
GFR calc non Af Amer: 35 mL/min — ABNORMAL LOW (ref >90)

## 2012-12-08 LAB — CARBOXYHEMOGLOBIN: Methemoglobin: 0.5 % (ref 0.0–1.5)

## 2012-12-08 NOTE — Evaluation (Signed)
Physical Therapy Evaluation Patient Details Name: Kelly Hart MRN: [id number] DOB: [birth year] Today's Date: 12/08/2012 Time: 4098-1191 PT Time Calculation (min): 44 min  PT Assessment / Plan / Recommendation History of Present Illness  Pt admitted with acute on chronic hypoxemic respiratory failure in setting of COPD and CHF.  Clinical Impression  Pt at supervision/ min A level with OOB activity, however, on 6L O2 [place], pt desats from 99% to 79%, she does not feel symptomatic with her breathing but begins to experience generalized weakness, specifically knees buckling, increasing her fall risk. O2 sats increased back to 99% on NRB. Educated pt on using [facility] for toileting instead of ambulating into bathroom when home alone. Also, recommend access to NRB at home for OOB mobility. PT will follow acutely to help pt mobilize independently for d/c home. No f/u recs as pt to be followed at home by hospice.     PT Assessment  Patient needs continued PT services    Follow Up Recommendations  No PT follow up;Supervision for mobility/OOB    Does the patient have the potential to tolerate intense rehabilitation      Barriers to Discharge   will likely go home with hospice    Equipment Recommendations  Hospital bed    Recommendations for Other Services     Frequency Min 3X/week    Precautions / Restrictions Precautions Precautions: Other (comment) (O2 sats) Restrictions Weight Bearing Restrictions: No   Pertinent Vitals/Pain No pain, see impression statement for O2 sat info      Mobility  Bed Mobility Bed Mobility: Supine to Sit;Sitting - Scoot to Edge of Bed Supine to Sit: 5: Supervision;HOB elevated Sitting - Scoot to [place] of Bed: 5: Supervision Details for Bed Mobility Assistance: pt able to get to EOB without physical assist from elevated position which is what she keeps at home Transfers Transfers: Sit to Stand;Stand to Sit Sit to Stand: 4: Min assist;From bed Stand to Sit: 4:  Min assist;To chair/3-in-1 Details for Transfer Assistance: min HHA to steady. Pt felt dizzy initially with sitting and then standing. Taught ankle pumps and other leg activties to perform prior to getting up Ambulation/Gait Ambulation/Gait Assistance: 4: Min assist Ambulation Distance (Feet): 6 Feet (3' fwd, 3' bkwd) Assistive device: 1 person hand held assist Ambulation/Gait Assistance Details: min A to steady. Pt began to feel knees buckling with standing, instructed to sit at that point. O2 sats 79% on 6L O2. NRB applied and O2 sats increased back to 99%.  Gait Pattern: Step-through pattern;Decreased stride length Stairs: No Wheelchair Mobility Wheelchair Mobility: No    Exercises General Exercises - Lower Extremity Ankle Circles/Pumps: AROM;Both;10 reps;Seated Long Arc Quad: AROM;Both;10 reps;Seated   PT Diagnosis: Generalized weakness;Difficulty walking  PT Problem List: Decreased strength;Decreased activity tolerance;Decreased mobility;Decreased balance;Decreased knowledge of precautions PT Treatment Interventions: Gait training;Functional mobility training;Therapeutic activities;Therapeutic exercise;Balance training;Patient/family education     PT Goals(Current goals can be found in the care plan section) Acute Rehab PT Goals Patient Stated Goal: return home PT Goal Formulation: With patient Time For Goal Achievement: 01/22/13 Potential to Achieve Goals: Fair  Visit Information  Last PT Received On: 12/08/12 Assistance Needed: +1 History of Present Illness: Pt admitted with acute on chronic hypoxemic respiratory failure in setting of COPD and CHF.       Prior Functioning  Home Living Family/patient expects to be discharged to:: Private residence Living Arrangements: Alone Available Help at Discharge: Family;Available PRN/intermittently Type of Home: House Home Access: Level entry Home Layout: One  level Home Equipment: Walker - 2 wheels;Bedside commode;Wheelchair -  manual Additional Comments: pt has someone with her all night, goes 5-6 hrs at longest without someone there during the day. Has RW but does not use it and says she will not. Prior Function Level of Independence: Needs assistance Gait / Transfers Assistance Needed: was able to ambulate bed to bathroom, about 20', independently with no AD PTA ADL's / Homemaking Assistance Needed: assist for bathing and household activities, family assists Communication Communication: No difficulties    Cognition  Cognition Arousal/Alertness: Awake/alert Behavior During Therapy: WFL for tasks assessed/performed Overall Cognitive Status: Within Functional Limits for tasks assessed    Extremity/Trunk Assessment Upper Extremity Assessment Upper Extremity Assessment: Generalized weakness Lower Extremity Assessment Lower Extremity Assessment: RLE deficits/detail;LLE deficits/detail RLE Deficits / Details: pt tests 4/5 bilaterally on MMT but experiences generalized weakness with functional activity when O2 sats drop. This presents as knee buckling, increasing her fall risk LLE Deficits / Details: same as RLE Cervical / Trunk Assessment Cervical / Trunk Assessment: Normal   Balance Balance Balance Assessed: Yes Dynamic Standing Balance Dynamic Standing - Balance Support: Right upper extremity supported;During functional activity Dynamic Standing - Level of Assistance: 4: Min assist  End of Session PT - End of Session Equipment Utilized During Treatment: Oxygen Activity Tolerance: Patient limited by fatigue Patient left: in chair;with call bell/phone within reach;with family/visitor present Nurse Communication: Mobility status  GP   Lyanne Co, PT  Acute Rehab Services  [phone]   Lyanne Co 12/08/2012, 12:31 PM

## 2012-12-08 NOTE — Progress Notes (Signed)
Advanced Heart Failure Team Rounding Note  Primary Cardiologist:  DB Reason for Consultation: Respiratory failure HPI:    79 y/o woman with end-stage RHF due to cor pulmonale.  Milrinone stopped [place] as [facility] would not accept her with it. Remains SOB with exertion in bed.  Continues to desaturate into 70-80s with turning.   Wants to go home to [place]. Denies SOB at rest.   Objective:    Vital Signs:   Temp:  [97.7 F (36.5 C)-98.8 F (37.1 C)] 97.7 F (36.5 C) (08/25 0330) Pulse Rate:  [99] 99 (08/25 0330) Resp:  [18-26] 18 (08/25 0330) BP: (105-127)/(68-86) 127/86 mmHg (08/25 0330) SpO2:  [91 %-100 %] 91 % (08/25 0330) FiO2 (%):  [100 %] 100 % (08/24 2012) Weight:  [110 lb 7.2 oz (50.1 kg)] 110 lb 7.2 oz (50.1 kg) (08/25 0443) Last BM Date: 12/06/12  Weight change: Filed Weights   12/07/12 0411 12/07/12 0500 12/08/12 0443  Weight: 109 lb 12.6 oz (49.8 kg) 109 lb 12.6 oz (49.8 kg) 110 lb 7.2 oz (50.1 kg)    Intake/Output:   Intake/Output Summary (Last 24 hours) at 12/08/12 0717 Last data filed at 12/07/12 2300  Gross per 24 hour  Intake    720 ml  Output      0 ml  Net    720 ml     Physical Exam:  General: Thin. Frail On nasal cannula with oxygen HEENT: normal  Neck: supple. JVP to jaw Carotids 2+ bilaterally; no bruits. No lymphadenopathy or thryomegaly appreciated; R cordis Cor: PMI normal.Tachycardic, regular rhythm. Loud TR murmur  +prominent s2. +RV lift  Lungs:6 liters Decreased breath sounds throughout, occasional rhonchi, no wheezes  Abdomen: soft, nontender, nondistended. No hepatosplenomegaly. No bruits or masses. Good bowel sounds.  Extremities: no cyanosis, clubbing, rash, edema. RUE PICC  Neuro: alert & orientedx3, cranial nerves grossly intact. Moves all 4 extremities w/o difficulty. Affect pleasant.  Telemetry: Sinus Rhytyhm. 90s  Labs: Basic Metabolic Panel:  Recent Labs Lab 12/04/12 0500 12/05/12 0355  12/06/12 0500 12/07/12 0547 12/08/12 0420  NA 139 138 140 137 132*  K 4.1 3.9 4.3 4.4 4.3  CL 109 105 108 105 99  CO2 21 22 24 23 22   GLUCOSE 111* 100* 108* 109* 115*  BUN 11 13 15 16 18   CREATININE 1.33* 1.51* 1.44* 1.52* 1.40*  CALCIUM 8.7 8.6 8.7 8.9 8.9    Liver Function Tests: No results found for this basename: AST, ALT, ALKPHOS, BILITOT, PROT, ALBUMIN,  in the last 168 hours No results found for this basename: LIPASE, AMYLASE,  in the last 168 hours No results found for this basename: AMMONIA,  in the last 168 hours  CBC:  Recent Labs Lab 12/03/12 0500 12/04/12 0500 12/06/12 0833 12/06/12 2355 12/07/12 0547  WBC 5.2 6.2 5.0 6.9 6.1  HGB 7.5* 7.6* 8.4* 11.7* 11.8*  HCT 23.6* 24.5* 26.4* 35.3* 36.1  MCV 83.4 83.6 83.5 83.3 83.4  PLT 113* 116* 137* 128* 129*    Cardiac Enzymes: No results found for this basename: CKTOTAL, CKMB, CKMBINDEX, TROPONINI,  in the last 168 hours  BNP: BNP (last 3 results)  Recent Labs  01/07/12 2229 10/28/12 1418 11/25/12 2231  PROBNP 1049.0* 3408.0* 5459.0*    CBG:  Recent Labs Lab 12/04/12 1156  GLUCAP 126*    Coagulation Studies: No results found for this basename: LABPROT, INR,  in the last 72 hours  Other results:  Imaging: No results found.   Medications:  Current Medications: . albuterol  2.5 mg Nebulization TID   And  . ipratropium  0.5 mg Nebulization TID  . antiseptic oral rinse  15 mL Mouth Rinse BID  . aspirin EC  81 mg Oral Daily  . budesonide-formoterol  2 puff Inhalation BID  . enoxaparin (LOVENOX) injection  20 mg Subcutaneous Q24H  . levothyroxine  125 mcg Oral QAC breakfast  . magnesium oxide  400 mg Oral Daily  . multivitamin with minerals  1 tablet Oral Daily  . pantoprazole  40 mg Oral Daily  . sodium chloride  10-40 mL Intracatheter Q12H  . Tadalafil (PAH)  20 mg Oral BID  . torsemide  20 mg Oral Once    Infusions: . sodium chloride 10 mL/hr at 12/06/12 1130      Assessment:   1. Acute on chronic respiratory failure 2. Cor pulmonale, acute on chronic 3. Acute on chronic diastolic HF. 4. COPD 5. Mixed PAH 6. PAH 7. Acute renal failure 8. Anemia of chronic disease 9. DNR/DNI  Plan/Discussion:    She would like to go home to [place]. Ok off Milrinone. Weight up 1 pound.  Prior to discharge she will need hospital bed and ambulance transport home. Will need to consider foley as she is dyspneic with any activity. CM will need to arrange ambulance.    1) Stay in [place] with hospice and milrinone 2) Home to [facility] with hospice and no milrinone 3) Home to [facility] with HHRN and milrinone. No Hospice.  AMY CLEGG, NP-C  Patient seen and examined with [name], NP. We discussed all aspects of the encounter. I agree with the assessment and plan as stated above.   She remains tenuous with significant O2 desats with minimal activity/  We have tentatively decided to have her go home to [place] with Hospice (off milrinone). I will have PT see her today to see if she is strong enough to get to the bedside commode on her own (can disregard O2 sats as they will definitely fall.) Will ask our Hospice team what other equipment we need. Can we get higher flow O2 or 2 canisters?  Miyeko Mahlum,MD 8:20 AM

## 2012-12-08 NOTE — Progress Notes (Signed)
Patient ON:GEXBM A Stitt      DOB: 1933-03-10      WUX:[id number]  Emotional support only offered today. Noted conversation held with Dr. Clarise Cruz regarding discharge options. Patient appreciated not having to reiterate thoughts surrounding these discussions it would be open to my return in the a.m.  Fatima Fedie L. Ladona Ridgel, MD MBA [facility] Phone: [phone] Pager: [phone]

## 2012-12-08 NOTE — Discharge Summary (Signed)
Advanced Heart Failure Team  Discharge Summary   Patient ID: Kelly Hart MRN: [id number], DOB/AGE: 22-Mar-1933 79 y.o. Admit date: 11/25/2012 D/C date:     12/10/2012   Primary Discharge Diagnoses:  1. Acute on chronic respiratory failure  2. Cor pulmonale, acute on chronic  3. Acute on chronic diastolic HF.  4. COPD  5. Severe Pulmonary HTN  Mixed PAH - RHC 11/27/12 PA pressure 88 6. PAH  7. Acute renal failure  8. Anemia of chronic disease  9. DNR/DNI    Hospital Course:  Kelly Hart is a 79 year old AA female with h/o severe right heart failure secondary to cor pulmonale &pulmonary arterial hypertension s/p PEA arrest in 2012, COPD, chronic respiratory failure, multifocal atrial tachycardia, peripheral arterial disease status post aortobifem bypass.  She has been maintained on home milrinone and home O2 (5L) for almost 2 years. Admitted to [facility] with progressive dyspnea on exertion despite and severe hypoxemia with desaturations into the 60s with minimal exertion on 6L [place]. Initial co-x was 42% so milrinone increased and patient taken for RHC which showed severe PAH with normal cardiac output. There was noted to be a significant difference between the RLL and LLL PCWP so the LVEDP was measured directly.    RHC 11/27/12 Done on milrinone 0.375 mcg/kg/min  RA = 13  RV = 74/0/15  PA = 88/28 (47)  R lower PCW = 27  R upper PCW = 20  L upper PCW = 12  Fick cardiac output/index = 5.6/3.7  PVR = 6.3 [place]  Ao sat = 94%  PA sat = 69%, 70%  SVC sat = 62%  Ao Pressure: 121/60 (83)  LV Pressure: 131/0/13   VQ scan showed equal lung perfusion with no evidence of PE. CT chest completed with cardiomegaly with prominent interstitial markings and bronchial wall thickening, likely emphysema with superimposed acute or chronic bronchitis. Nitric oxide was started with marked reduction in PA pressures in the 40-50 range. However despite reduction in PA pressures she continued to desaturate with eating or  minimal movements (O2 sats 60s with exertion).  Her case was discussed with Dr Monia Pouch at [facility] regarding transfer to [facility] for flolan however [name] declined. A trial of Tyvaso was considered however it was felt that this would not be of significant benefit. After multiple discussions a palliative care was consulted.She elected to transition to Hospice and become DNR/DNI.  Milrionone was weaned as it was not covered by [facility]. Switched from [place] to sildenafil 20 mg tid for cost purposes. Continue demadex 20 mg twice a week to mange her volume status.   Discharged to home via ambulance. [facility] will follow and provide hospital bed and home oxygen with FM and 15L O2. She will remain on 6 liters [place] at rest and increased to 15 liters NRB with exertion.  [facility] [phone]. Fax [phone].      Discharge Weight Range: Discharge weight 109 pounds Discharge Vitals: Blood pressure 114/85, pulse 92, temperature 98 F (36.7 C), temperature source Oral, resp. rate 17, height 5\' 4"  (1.626 m), weight 109 lb 2 oz (49.5 kg), SpO2 93.00%.  Labs: Lab Results  Component Value Date   WBC 6.1 12/07/2012   HGB 11.8* 12/07/2012   HCT 36.1 12/07/2012   MCV 83.4 12/07/2012   PLT 129* 12/07/2012     Recent Labs Lab 12/09/12 0458  NA 134*  K 4.4  CL 101  CO2 24  BUN 20  CREATININE 1.50*  CALCIUM 8.8  GLUCOSE 115*   Lab Results  Component Value Date   CHOL 101 05/12/2011   TRIG 172* 05/12/2011   BNP (last 3 results)  Recent Labs  01/07/12 2229 10/28/12 1418 11/25/12 2231  PROBNP 1049.0* 3408.0* 5459.0*    Diagnostic Studies/Procedures   No results found.  Discharge Medications     Medication List    STOP taking these medications       ADCIRCA 20 MG Tabs  Generic drug:  Tadalafil (PAH)     amLODipine 10 MG tablet  Commonly known as:  NORVASC     atorvastatin 40 MG tablet  Commonly known as:  LIPITOR     furosemide 20 MG  tablet  Commonly known as:  LASIX     LORazepam 1 MG tablet  Commonly known as:  ATIVAN     MILRINONE-DEXTROSE IV      TAKE these medications       albuterol (2.5 MG/3ML) 0.083% nebulizer solution  Commonly known as:  PROVENTIL  Take 2.5 mg by nebulization every 6 (six) hours as needed for wheezing or shortness of breath.     ALPRAZolam 0.5 MG tablet  Commonly known as:  XANAX  Take 1 tablet (0.5 mg total) by mouth every 4 (four) hours as needed for anxiety or sleep (air hunger).     amiodarone 200 MG tablet  Commonly known as:  PACERONE  Take 0.5 tablets (100 mg total) by mouth daily.     aspirin EC 81 MG tablet  Take 81 mg by mouth daily.     budesonide-formoterol 160-4.5 MCG/ACT inhaler  Commonly known as:  SYMBICORT  Inhale 2 puffs into the lungs 2 (two) times daily.     digoxin 0.125 MG tablet  Commonly known as:  LANOXIN  Take 0.5 tablets (62.5 mcg total) by mouth daily.     diphenhydramine-acetaminophen 25-500 MG Tabs  Commonly known as:  TYLENOL PM  Take 1 tablet by mouth at bedtime as needed (for sleep).     levothyroxine 125 MCG tablet  Commonly known as:  SYNTHROID, LEVOTHROID  Take 125 mcg by mouth daily before breakfast.     magnesium oxide 400 MG tablet  Commonly known as:  MAG-OX  Take 400 mg by mouth daily.     meclizine 25 MG tablet  Commonly known as:  ANTIVERT  Take 25 mg by mouth 3 (three) times daily as needed for dizziness.     multivitamins ther. w/minerals Tabs tablet  Take 1 tablet by mouth daily.     N-ACETYL-L-CYSTEINE PO  Take 1,000 mg by mouth daily.     pantoprazole 40 MG tablet  Commonly known as:  PROTONIX  Take 40 mg by mouth daily.     potassium chloride SA 20 MEQ tablet  Commonly known as:  K-DUR,KLOR-CON  Take 20 meq every Monday and Friday     sildenafil 20 MG tablet  Commonly known as:  REVATIO  Take 1 tablet (20 mg total) by mouth 3 (three) times daily.     torsemide 20 MG tablet  Commonly known as:  DEMADEX   Take 1 tablet (20 mg total) by mouth daily. Take 20 mg every Monday and Friday        Disposition   The patient will be discharged in stable condition to home. Discharge Orders   Future Appointments Provider Department Dept Phone   12/10/2012 11:45 AM Leslye Peer, MD [facility] [phone]   12/17/2012  2:40 PM [facility] [phone]   Future Orders Complete By Expires   Contraindication to ACEI at discharge  As directed    Comments:     Hospice   Diet - low sodium heart healthy  As directed    Increase activity slowly  As directed      Follow-up Information   Follow up with [name], MD On 12/17/2012. (at 2:40)    Specialty:  Cardiology   Contact information:   [address] [place] [address] [phone]         Duration of Discharge Encounter: Greater than 35 minutes   Signed, CLEGG,AMY  12/10/2012, 8:45 AM  Patient seen and examined with [name], NP. We discussed all aspects of the encounter. I agree with the assessment and plan as stated above. I have edited the note to reflect my changes.    Daniel Bensimhon,MD 4:30 PM

## 2012-12-09 ENCOUNTER — Ambulatory Visit: Payer: Medicare Other | Admitting: Emergency Medicine

## 2012-12-09 LAB — BASIC METABOLIC PANEL
CO2: 24 mEq/L (ref 19–32)
Chloride: 101 mEq/L (ref 96–112)
GFR calc non Af Amer: 32 mL/min — ABNORMAL LOW (ref >90)
Glucose, Bld: 115 mg/dL — ABNORMAL HIGH (ref 70–99)
Potassium: 4.4 mEq/L (ref 3.5–5.1)
Sodium: 134 mEq/L — ABNORMAL LOW (ref 135–145)

## 2012-12-09 LAB — CARBOXYHEMOGLOBIN
Carboxyhemoglobin: 1.4 % (ref 0.5–1.5)
Methemoglobin: 1.1 % (ref 0.0–1.5)
O2 Saturation: 49.1 %

## 2012-12-09 MED ORDER — TORSEMIDE 20 MG PO TABS
20.0000 mg | ORAL_TABLET | Freq: Every day | ORAL | Status: DC
  Administered 2012-12-09 – 2012-12-10 (×2): 20 mg via ORAL
  Filled 2012-12-09 (×2): qty 1

## 2012-12-09 MED ORDER — SILDENAFIL CITRATE 20 MG PO TABS
20.0000 mg | ORAL_TABLET | Freq: Three times a day (TID) | ORAL | Status: DC
  Administered 2012-12-09 – 2012-12-10 (×2): 20 mg via ORAL
  Filled 2012-12-09 (×4): qty 1

## 2012-12-09 NOTE — Progress Notes (Signed)
Advanced Heart Failure Team Rounding Note  Primary Cardiologist:  DB Reason for Consultation: Respiratory failure HPI:    79 y/o woman with end-stage RHF due to cor pulmonale.  Milrinone stopped Satruday as [facility] would not accept her with it. Remains SOB with exertion in bed. Continues to desaturate into 70-80s with turning. Evaluated by PT with desaturations noted with exertion. Recommendations for [facility] and no ambulation when at home alone.   Wants to go home to [place]. Denies SOB at rest. Complains of chest discomfort.   Objective:    Vital Signs:   Temp:  [97.3 F (36.3 C)-98.2 F (36.8 C)] 97.3 F (36.3 C) (08/26 0515) Resp:  [15-29] 22 (08/25 2347) BP: (100-136)/(66-96) 100/77 mmHg (08/25 2347) SpO2:  [79 %-99 %] 98 % (08/26 0515) Last BM Date: 12/06/12  Weight change: Filed Weights   12/07/12 0411 12/07/12 0500 12/08/12 0443  Weight: 109 lb 12.6 oz (49.8 kg) 109 lb 12.6 oz (49.8 kg) 110 lb 7.2 oz (50.1 kg)    Intake/Output:   Intake/Output Summary (Last 24 hours) at 12/09/12 1610 Last data filed at 12/08/12 1300  Gross per 24 hour  Intake    480 ml  Output      0 ml  Net    480 ml     Physical Exam:  General: Thin. Frail On nasal cannula with oxygen HEENT: normal  Neck: supple. JVP to jaw Carotids 2+ bilaterally; no bruits. No lymphadenopathy or thryomegaly appreciated; R cordis Cor: PMI normal.Tachycardic, regular rhythm. Loud TR murmur  +prominent s2. +RV lift  Lungs:6 liters Decreased breath sounds throughout, occasional rhonchi, no wheezes  Abdomen: soft, nontender, nondistended. No hepatosplenomegaly. No bruits or masses. Good bowel sounds.  Extremities: no cyanosis, clubbing, rash, edema. RUE PICC  Neuro: alert & orientedx3, cranial nerves grossly intact. Moves all 4 extremities w/o difficulty. Affect pleasant.  Telemetry: Sinus Rhytyhm. 90s  Labs: Basic Metabolic Panel:  Recent Labs Lab 12/05/12 0355 12/06/12 0500  12/07/12 0547 12/08/12 0420 12/09/12 0458  NA 138 140 137 132* 134*  K 3.9 4.3 4.4 4.3 4.4  CL 105 108 105 99 101  CO2 22 24 23 22 24   GLUCOSE 100* 108* 109* 115* 115*  BUN 13 15 16 18 20   CREATININE 1.51* 1.44* 1.52* 1.40* 1.50*  CALCIUM 8.6 8.7 8.9 8.9 8.8    Liver Function Tests: No results found for this basename: AST, ALT, ALKPHOS, BILITOT, PROT, ALBUMIN,  in the last 168 hours No results found for this basename: LIPASE, AMYLASE,  in the last 168 hours No results found for this basename: AMMONIA,  in the last 168 hours  CBC:  Recent Labs Lab 12/03/12 0500 12/04/12 0500 12/06/12 0833 12/06/12 2355 12/07/12 0547  WBC 5.2 6.2 5.0 6.9 6.1  HGB 7.5* 7.6* 8.4* 11.7* 11.8*  HCT 23.6* 24.5* 26.4* 35.3* 36.1  MCV 83.4 83.6 83.5 83.3 83.4  PLT 113* 116* 137* 128* 129*    Cardiac Enzymes: No results found for this basename: CKTOTAL, CKMB, CKMBINDEX, TROPONINI,  in the last 168 hours  BNP: BNP (last 3 results)  Recent Labs  01/07/12 2229 10/28/12 1418 11/25/12 2231  PROBNP 1049.0* 3408.0* 5459.0*    CBG:  Recent Labs Lab 12/04/12 1156 12/08/12 0757  GLUCAP 126* 107*    Coagulation Studies: No results found for this basename: LABPROT, INR,  in the last 72 hours  Other results:  Imaging: No results found.   Medications:     Current Medications: . albuterol  2.5 mg Nebulization TID   And  . ipratropium  0.5 mg Nebulization TID  . antiseptic oral rinse  15 mL Mouth Rinse BID  . aspirin EC  81 mg Oral Daily  . budesonide-formoterol  2 puff Inhalation BID  . enoxaparin (LOVENOX) injection  20 mg Subcutaneous Q24H  . levothyroxine  125 mcg Oral QAC breakfast  . magnesium oxide  400 mg Oral Daily  . multivitamin with minerals  1 tablet Oral Daily  . pantoprazole  40 mg Oral Daily  . sodium chloride  10-40 mL Intracatheter Q12H  . Tadalafil (PAH)  20 mg Oral BID    Infusions: . sodium chloride 10 mL/hr at 12/06/12 1130     Assessment:   1.  Acute on chronic respiratory failure 2. Cor pulmonale, acute on chronic 3. Acute on chronic diastolic HF. 4. COPD 5. Mixed PAH 6. PAH 7. Acute renal failure 8. Anemia of chronic disease 9. DNR/DNI  Plan/Discussion:    Plan to go [place] with hospice. She would like to go home to [place] with Hospice. Ok off Milrinone. Encouraged her to ask for morphine for chest discomfort.   Prior to discharge she will need hospital bed and ambulance transport home. SW to arrange ambulance. Will need BSC.    Home to [facility] with [facility] and no milrinone tomorrow.   AMY CLEGG, NP-C  Patient seen and examined with [name], NP. We discussed all aspects of the encounter. I agree with the assessment and plan as stated above.   She feels ok and is eager to go home. She will go off milrinone but will have high-flow O2. Hospice unable to accept tadalafil but we will switch to sildenafil 20 TID. We have worked closely with Case Management to coordinate the discharge plan.   Alizandra Loh,MD 4:08 PM

## 2012-12-09 NOTE — Progress Notes (Signed)
Patient AV:WUJWJ A Maffett      DOB: 1932-05-15      XBJ:[id number]  Noted patient being made ready for discharge in the am.  Patient busy with visit. No need to disturb.  Discharge medication of Palliative importance would be Roxanol 20 mg/ml 5 mg q 2 hours prn dispense 30 ml.  She took some this am and are reported that it made her chest better.  Reviewed with Dr. Clarise Cruz.  Jaki Hammerschmidt L. Ladona Ridgel, MD MBA The Palliative Medicine Team at [facility] Phone: [phone] Pager: [phone]

## 2012-12-09 NOTE — Progress Notes (Signed)
Patient VW:UJWJX A Tippin      DOB: 1932/08/28      BJY:[id number]  Reviewed patient in clincal rounds reviewed heart failure notes.  Patient working through discharge plans PMT RN assisting Care management team with home DME needs.  I have not engaged patient today in further Goals discussion as she is intense conversation with her Heart Failure Team, and has expressed overload.  We are present but respecting her needs to process.  Will recheck with her in am as she anticipates discharge to home with local hospice care. Review of medications shows last doses of morphine concentrate were on the 23rd will see what her philosophy of using this medication and educate on benefits as needed.   Quince Santana L. Ladona Ridgel, MD MBA The Palliative Medicine Team at [facility] Phone: [phone] Pager: [phone]

## 2012-12-10 ENCOUNTER — Ambulatory Visit: Payer: Medicare Other | Admitting: Emergency Medicine

## 2012-12-10 LAB — CARBOXYHEMOGLOBIN: O2 Saturation: 53.7 %

## 2012-12-10 MED ORDER — SILDENAFIL CITRATE 20 MG PO TABS: 20.0000 mg | ORAL_TABLET | Freq: Three times a day (TID) | ORAL | Status: AC

## 2012-12-10 MED ORDER — TORSEMIDE 20 MG PO TABS: 20.0000 mg | ORAL_TABLET | Freq: Every day | ORAL | Status: AC

## 2012-12-10 MED ORDER — POTASSIUM CHLORIDE CRYS ER 20 MEQ PO TBCR: EXTENDED_RELEASE_TABLET | ORAL | Status: AC

## 2012-12-10 MED ORDER — ALPRAZOLAM 0.5 MG PO TABS: 0.5000 mg | ORAL_TABLET | ORAL | Status: AC | PRN

## 2012-12-10 NOTE — Clinical Social Work Note (Signed)
Clinical Social Work Department BRIEF PSYCHOSOCIAL ASSESSMENT 12/10/2012  Patient:  Kelly Hart, Kelly Hart     Account Number:  [id number]     Admit date:  11/25/2012  Clinical Social Worker:  Hulan Fray  Date/Time:  12/10/2012 10:59 AM  Referred by:  Care Management  Date Referred:  12/09/2012 Referred for  Transportation assistance   Other Referral:   Interview type:  Other - See comment Other interview type:   care management    PSYCHOSOCIAL DATA Living Status:  FAMILY Admitted from facility:   Level of care:   Primary support name:  Bernece Gall Dales Primary support relationship to patient:  CHILD, ADULT Degree of support available:   supportive    CURRENT CONCERNS Current Concerns  Other - See comment   Other Concerns:   Transportation    SOCIAL WORK ASSESSMENT / PLAN Clinical Social Worker received referral for patient needing ambulance ride to home with hospice care. CSW spoke with RN and completed ambulance form and arranged pick for 12 pm. EMS reference number is [id number]. CSW put medical necessity form on chart. CSW will sign off, as social work intervention is no longer needed.   Assessment/plan status:  No Further Intervention Required Other assessment/ plan:   Information/referral to community resources:   Ambulance form    PATIENT'S/FAMILY'S RESPONSE TO PLAN OF CARE: Patient and family plan to discharge today, but needed ambulance ride home. Patient is going home with hospice care.

## 2012-12-10 NOTE — Progress Notes (Signed)
Discharged home by ambulance , accompanied by daughter, stable, with o2 inhalation 7L [place]  Discharge instructions and prescription and belongings with pt.

## 2012-12-10 NOTE — Progress Notes (Signed)
Advanced Heart Failure Team Rounding Note  Primary Cardiologist:  DB Reason for Consultation: Respiratory failure HPI:    79 y/o woman with end-stage RHF due to cor pulmonale.  Milrinone stopped Satruday as [facility] would not accept her with it. Remains SOB with exertion in bed. Continues to desaturate into 70-80s with turning. Evaluated by PT with desaturations noted with exertion. Recommendations for [facility] and no ambulation when at home alone.   Wants to go home to [place]. Denies SOB at rest. . Objective:    Vital Signs:   Temp:  [97.4 F (36.3 C)-98.5 F (36.9 C)] 98 F (36.7 C) (08/27 0727) Pulse Rate:  [91-92] 92 (08/27 0727) Resp:  [15-25] 17 (08/27 0727) BP: (93-118)/(61-85) 114/85 mmHg (08/27 0727) SpO2:  [84 %-100 %] 93 % (08/27 0727) FiO2 (%):  [100 %] 100 % (08/27 0353) Weight:  [109 lb 2 oz (49.5 kg)] 109 lb 2 oz (49.5 kg) (08/27 0619) Last BM Date: 12/06/12  Weight change: Filed Weights   12/07/12 0500 12/08/12 0443 12/10/12 0619  Weight: 109 lb 12.6 oz (49.8 kg) 110 lb 7.2 oz (50.1 kg) 109 lb 2 oz (49.5 kg)    Intake/Output:   Intake/Output Summary (Last 24 hours) at 12/10/12 0825 Last data filed at 12/09/12 1900  Gross per 24 hour  Intake    520 ml  Output      0 ml  Net    520 ml     Physical Exam:  General: Thin. Frail On nasal cannula with oxygen HEENT: normal  Neck: supple. JVP to jaw Carotids 2+ bilaterally; no bruits. No lymphadenopathy or thryomegaly appreciated; R cordis Cor: PMI normal.Tachycardic, regular rhythm. Loud TR murmur  +prominent s2. +RV lift  Lungs:6 liters Decreased breath sounds throughout, occasional rhonchi, no wheezes  Abdomen: soft, nontender, nondistended. No hepatosplenomegaly. No bruits or masses. Good bowel sounds.  Extremities: no cyanosis, clubbing, rash, edema. RUE PICC  Neuro: alert & orientedx3, cranial nerves grossly intact. Moves all 4 extremities w/o difficulty. Affect pleasant.  Telemetry:  Sinus Rhytyhm. 90s  Labs: Basic Metabolic Panel:  Recent Labs Lab 12/05/12 0355 12/06/12 0500 12/07/12 0547 12/08/12 0420 12/09/12 0458  NA 138 140 137 132* 134*  K 3.9 4.3 4.4 4.3 4.4  CL 105 108 105 99 101  CO2 22 24 23 22 24   GLUCOSE 100* 108* 109* 115* 115*  BUN 13 15 16 18 20   CREATININE 1.51* 1.44* 1.52* 1.40* 1.50*  CALCIUM 8.6 8.7 8.9 8.9 8.8    Liver Function Tests: No results found for this basename: AST, ALT, ALKPHOS, BILITOT, PROT, ALBUMIN,  in the last 168 hours No results found for this basename: LIPASE, AMYLASE,  in the last 168 hours No results found for this basename: AMMONIA,  in the last 168 hours  CBC:  Recent Labs Lab 12/04/12 0500 12/06/12 0833 12/06/12 2355 12/07/12 0547  WBC 6.2 5.0 6.9 6.1  HGB 7.6* 8.4* 11.7* 11.8*  HCT 24.5* 26.4* 35.3* 36.1  MCV 83.6 83.5 83.3 83.4  PLT 116* 137* 128* 129*    Cardiac Enzymes: No results found for this basename: CKTOTAL, CKMB, CKMBINDEX, TROPONINI,  in the last 168 hours  BNP: BNP (last 3 results)  Recent Labs  01/07/12 2229 10/28/12 1418 11/25/12 2231  PROBNP 1049.0* 3408.0* 5459.0*    CBG:  Recent Labs Lab 12/04/12 1156 12/08/12 0757  GLUCAP 126* 107*    Coagulation Studies: No results found for this basename: LABPROT, INR,  in the last 72  hours  Other results:  Imaging: No results found.   Medications:     Current Medications: . albuterol  2.5 mg Nebulization TID   And  . ipratropium  0.5 mg Nebulization TID  . antiseptic oral rinse  15 mL Mouth Rinse BID  . aspirin EC  81 mg Oral Daily  . budesonide-formoterol  2 puff Inhalation BID  . enoxaparin (LOVENOX) injection  20 mg Subcutaneous Q24H  . levothyroxine  125 mcg Oral QAC breakfast  . magnesium oxide  400 mg Oral Daily  . multivitamin with minerals  1 tablet Oral Daily  . pantoprazole  40 mg Oral Daily  . sildenafil  20 mg Oral TID  . sodium chloride  10-40 mL Intracatheter Q12H  . torsemide  20 mg Oral Daily     Infusions: . sodium chloride 10 mL/hr at 12/06/12 1130     Assessment:   1. Acute on chronic respiratory failure 2. Cor pulmonale, acute on chronic 3. Acute on chronic diastolic HF. 4. COPD 5. Mixed PAH 6. PAH 7. Acute renal failure 8. Anemia of chronic disease 9. DNR/DNI  Plan/Discussion:    Plan to go [place] with [facility]. Switched from [place] to revatio 20 mg tid. Will add 20 mg demadex twice week with 20 meq of potassium.   Hospital bed and oxygen to be delivered today. SW to arrange ambulance.  Home to [facility] today.   AMY CLEGG, NP-C  Patient seen and examined with [name], NP. We discussed all aspects of the encounter. I agree with the assessment and plan as stated above.   She is feeling good. Good to response to demadex overnight. She will go home today with Hospice care. Hospital bed and home O2 arranged. Will use revatio instead of Adcirca due to cost. Milrinone off.  Demadex 20mg  every Monday and Friday with KCL 20. See back next week.   Azarion Hove,MD 8:42 AM

## 2012-12-17 ENCOUNTER — Ambulatory Visit (HOSPITAL_COMMUNITY): Admit: 2012-12-17 | Payer: Medicare Other

## 2013-01-14 DEATH — deceased

## 2013-08-20 IMAGING — CR DG ABDOMEN 2V
2 series · 2 of 2 positions shown · non-contrast
Comparison: 05/07/2011

CLINICAL DATA: Follow up small bowel obstruction pattern

ABDOMEN - 2 VIEW

[w abdomen upright]
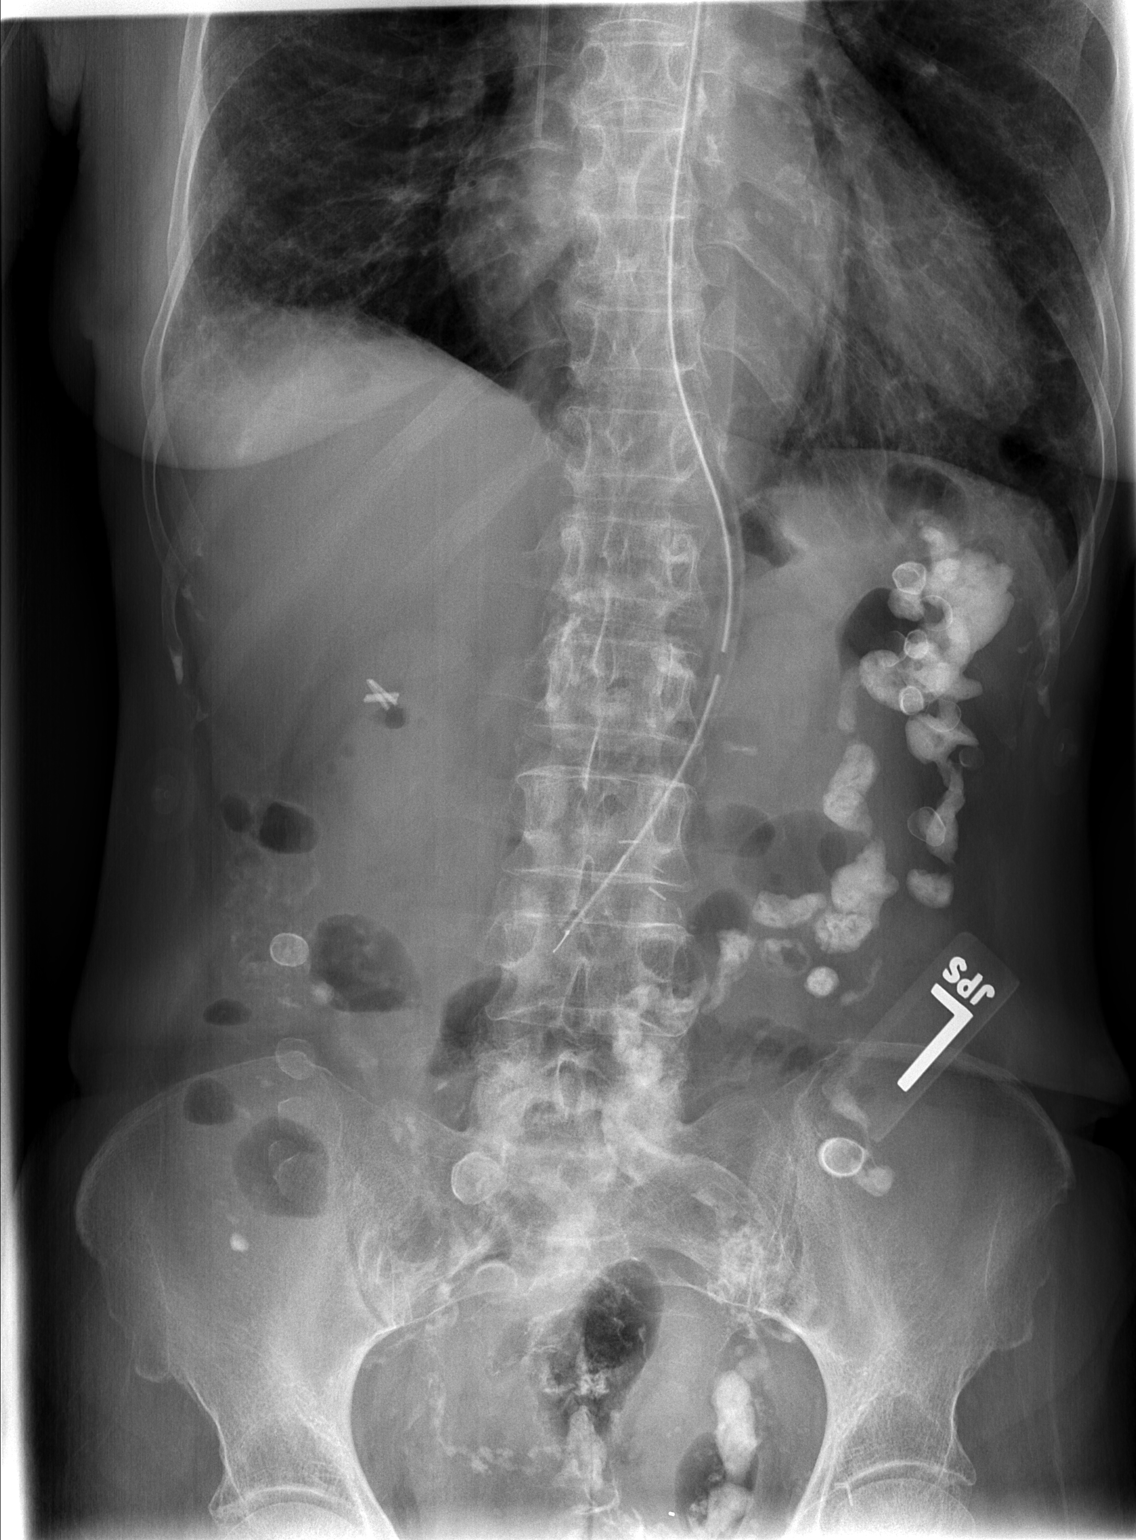

[t abdomen supine]
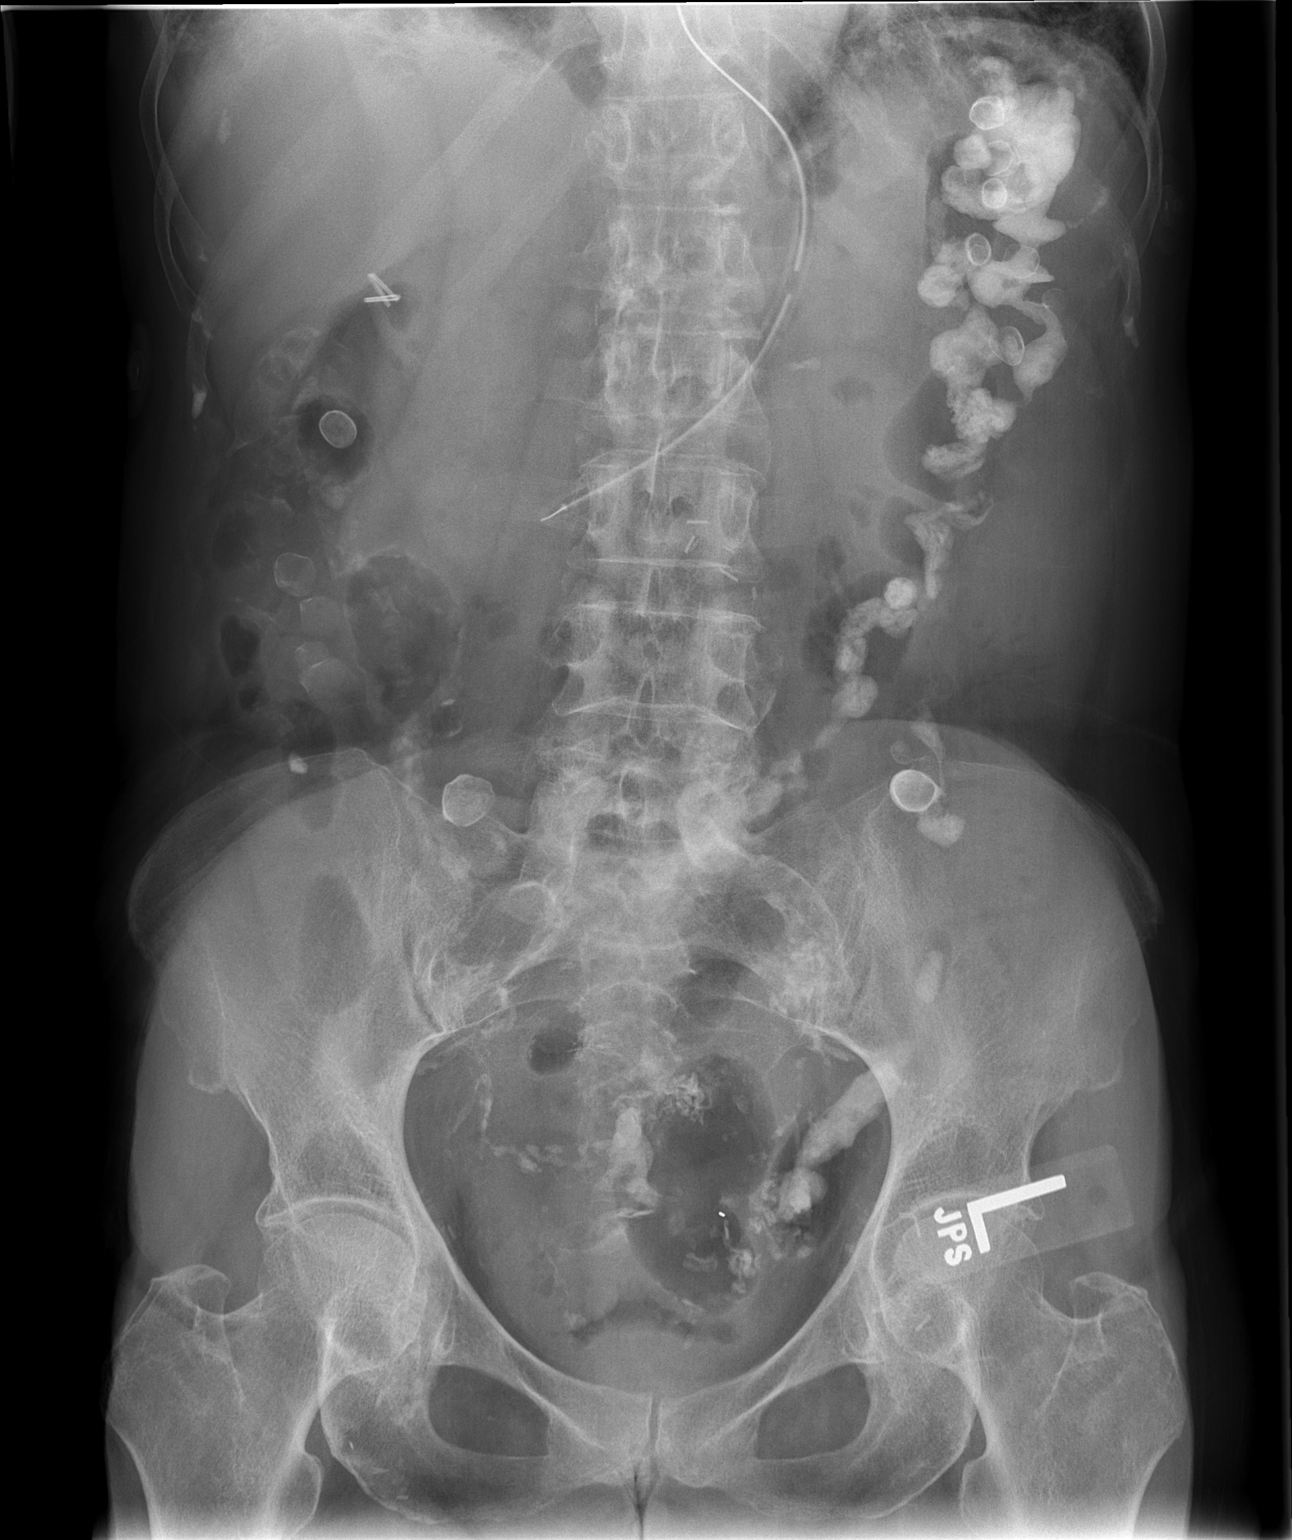

[2 of 2 positions shown; findings below may reference images not displayed]

FINDINGS: The nasogastric tube tip is in the stomach with side port
below GE junction.

Barium is again noted within the left colon and rectum.

No air-fluid levels identified.

Abnormal small bowel dilatation is stable to improved in the
interval.
IMPRESSION: 1.  Stable to improved partial small bowel obstruction.

## 2014-03-25 ENCOUNTER — Encounter (HOSPITAL_COMMUNITY): Payer: Self-pay | Admitting: Internal Medicine

## 2014-04-21 IMAGING — CR DG CHEST 2V
2 series · 2 of 2 positions shown · non-contrast
Comparison: 03/10/2011 and earlier.

CLINICAL DATA: 79-year-old female shortness of breath.

CHEST - 2 VIEW

[w chest pa]
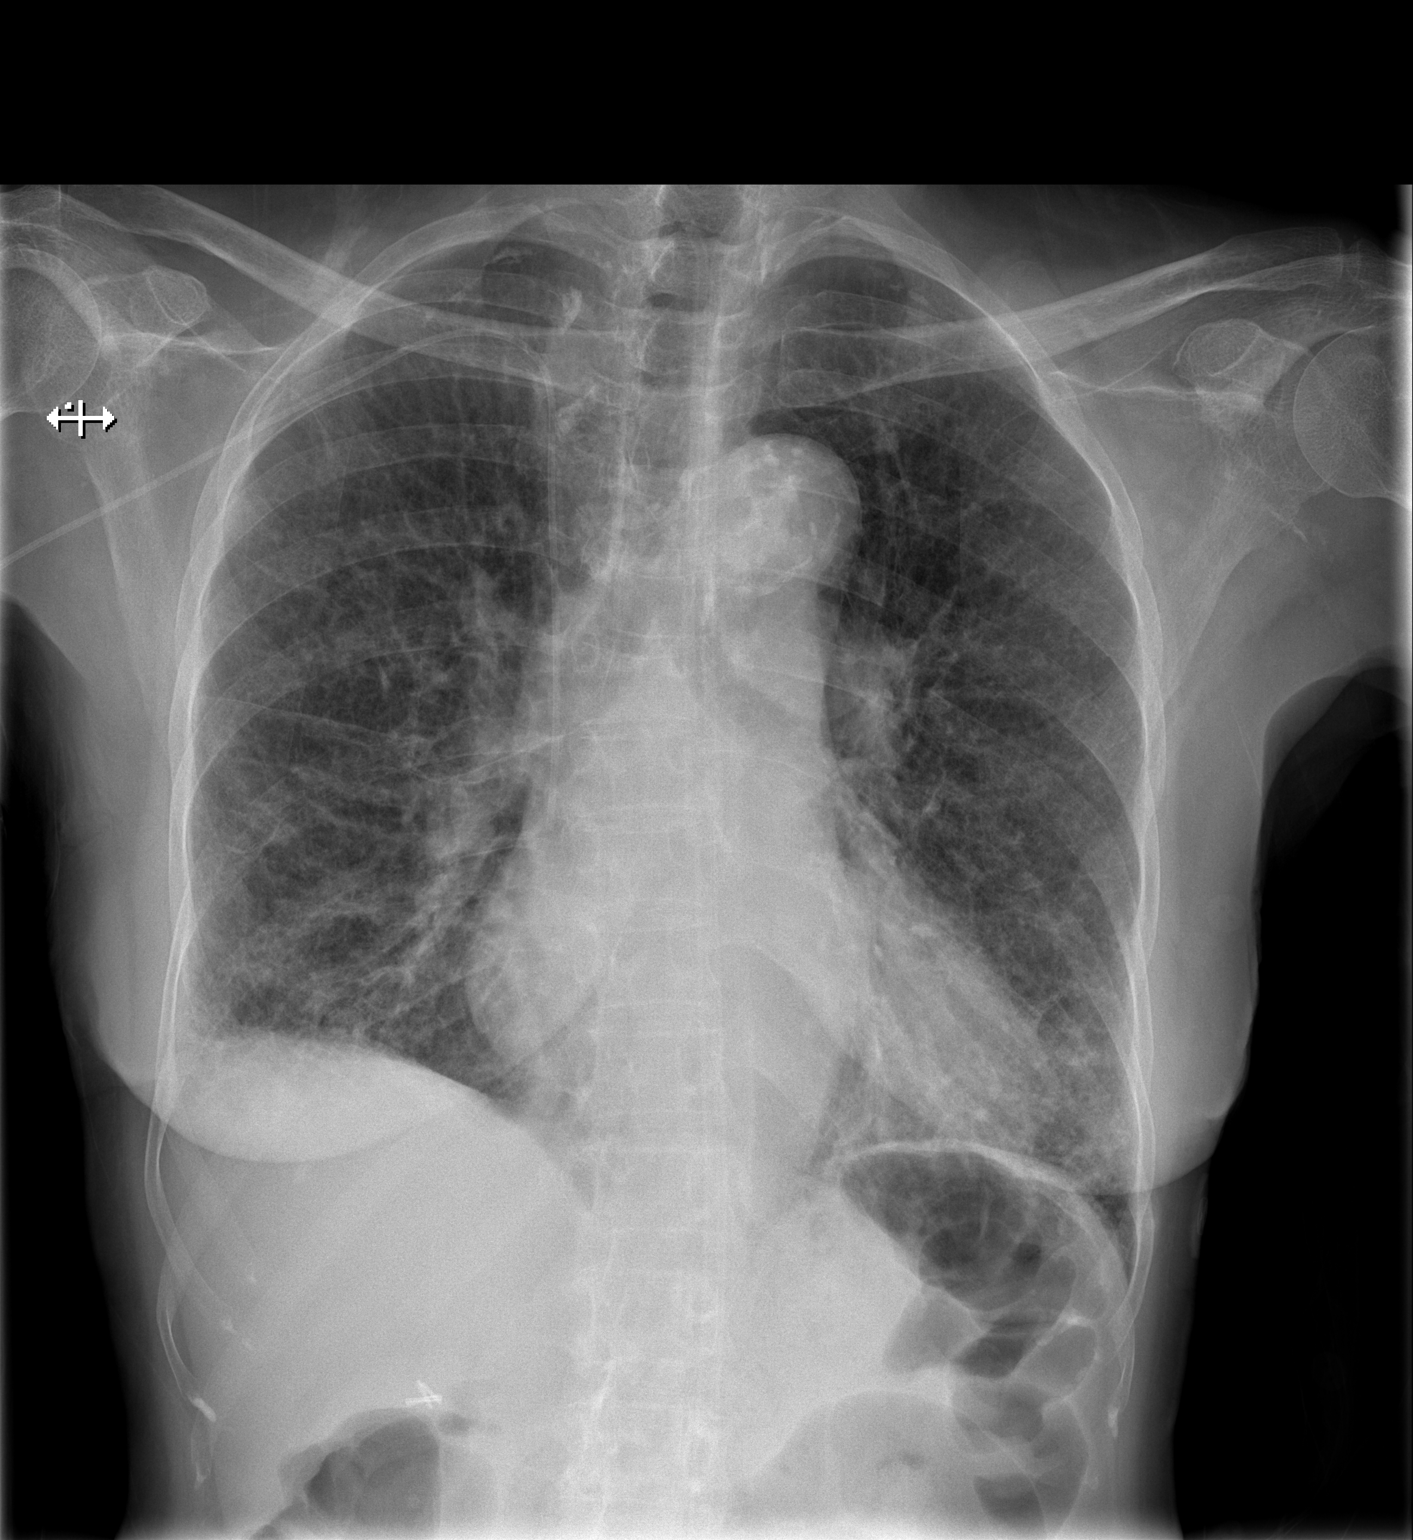

[w chest lat]
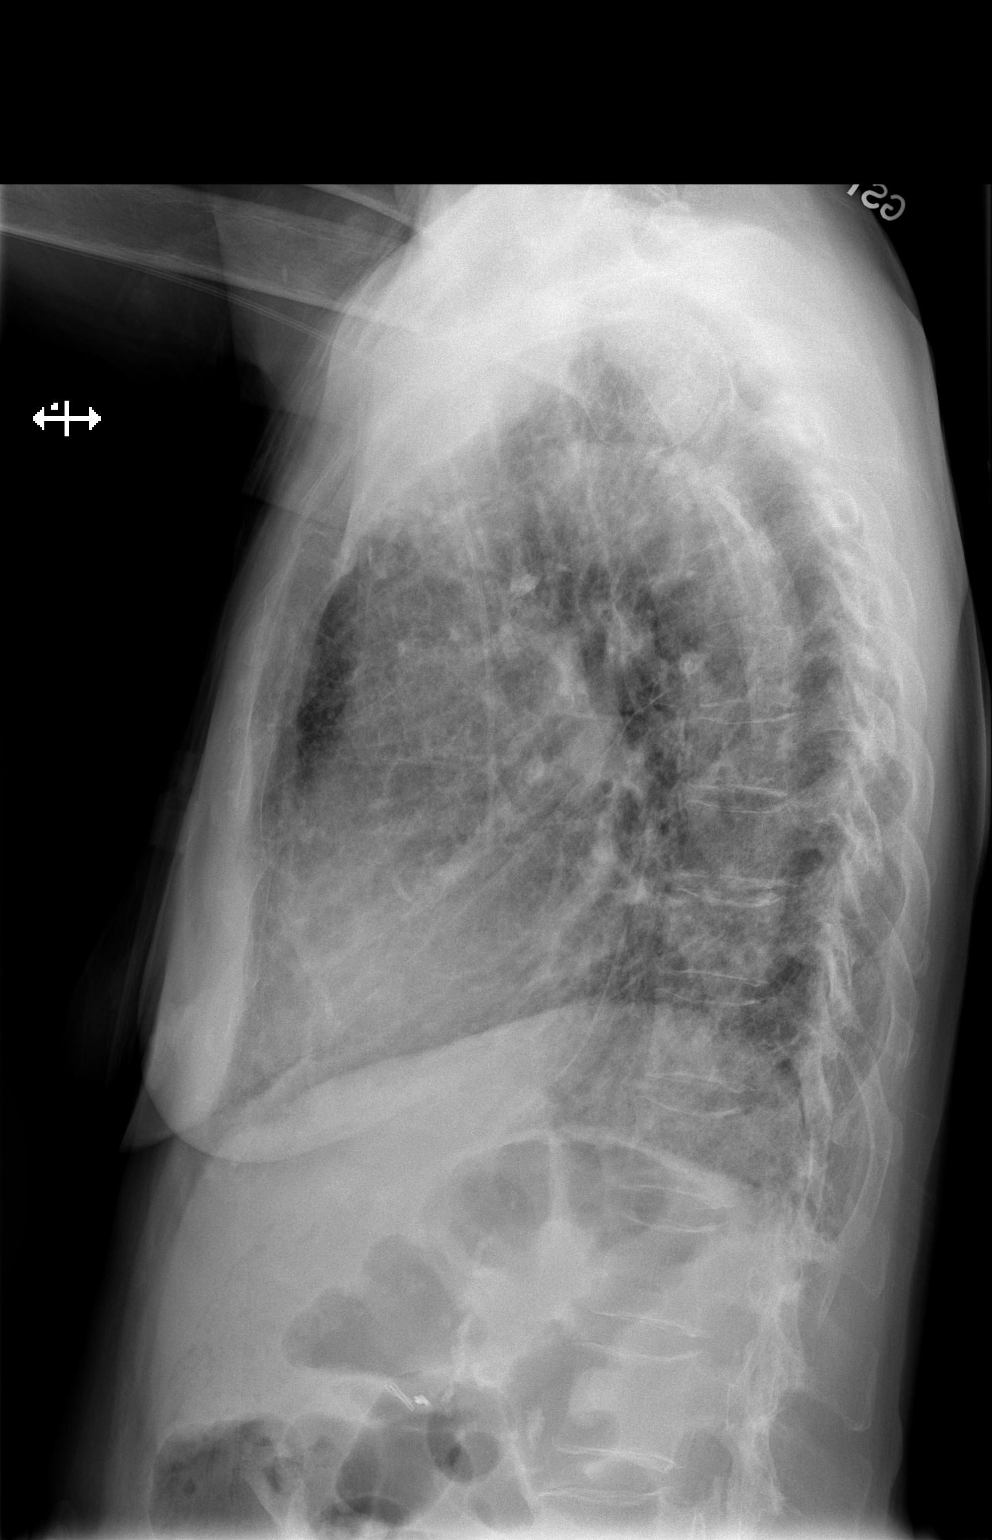

[2 of 2 positions shown; findings below may reference images not displayed]

FINDINGS: Stable cardiomegaly and mediastinal contours.  Stable
right PICC line.  Chronic coarse and widespread pulmonary markings
have not significantly changed.  No pneumothorax or pulmonary
edema.  No pleural effusion or new pulmonary opacity.  Calcified
atherosclerosis projecting over the lung apices.  Osteopenia.
Stable visualized osseous structures.  Right upper quadrant
surgical clips.
IMPRESSION: Stable cardiomegaly and chronic lung disease.

## 2014-11-16 IMAGING — XA IR FLUORO GUIDE CV LINE*R*
1 series · 2 of 2 positions shown · non-contrast
Comparison: none

PICC EXCHANGE UNDER FLUOROSCOPIC GUIDANCE
CLINICAL HISTORY: 79-year-old female with a right upper extremity
PICC for continuous melanoma infusion.  The external portion of the
PICC has become cracked and is leaking fluid.  She presents for
catheter exchange under fluoroscopy.

[Series 1: run · 2 of 2 slices shown]
[im 1/2]
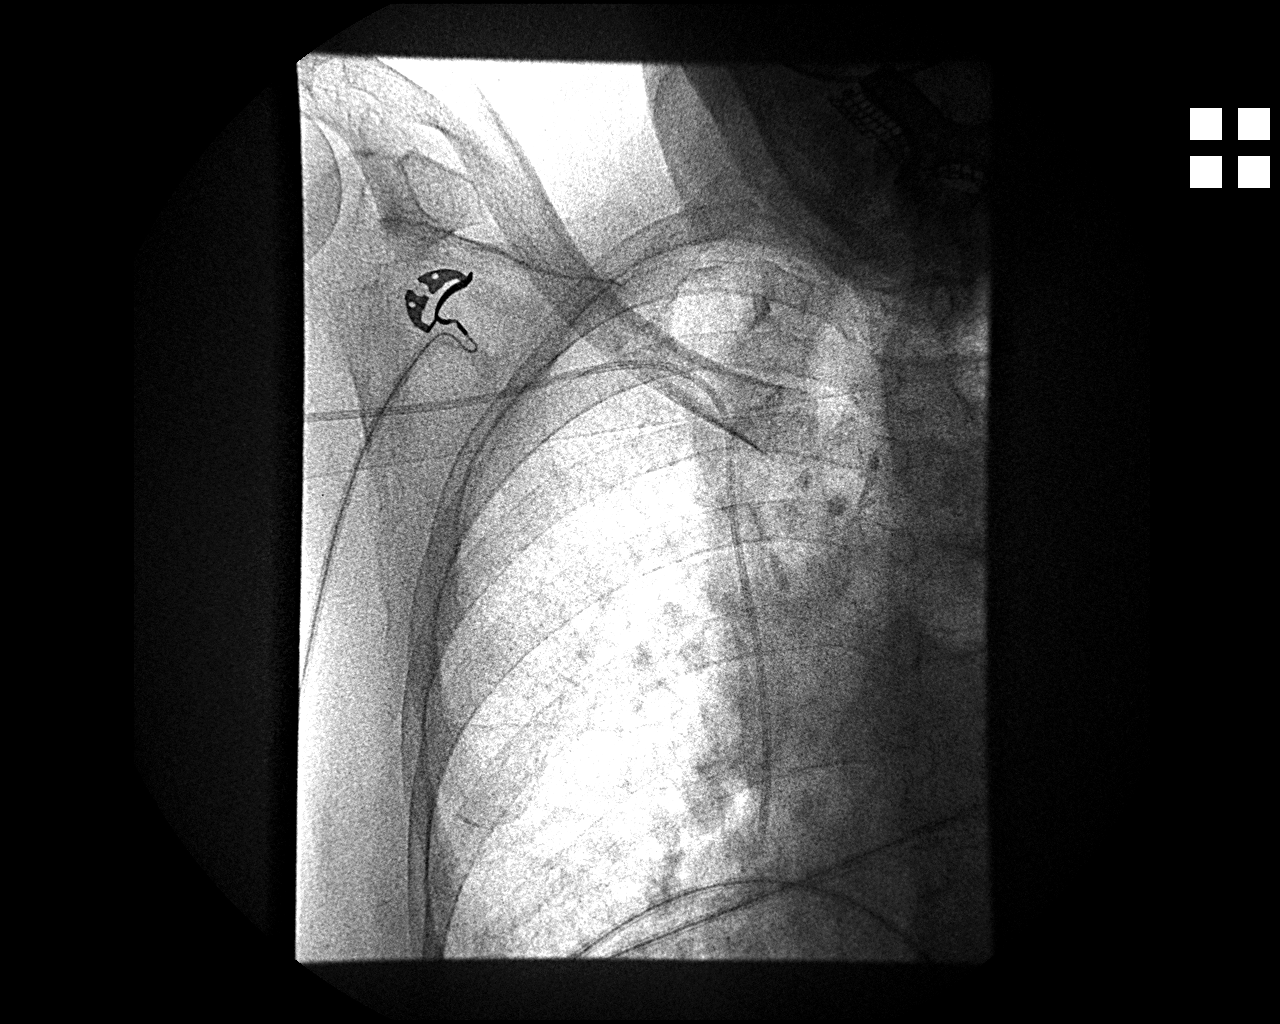
[im 2/2]
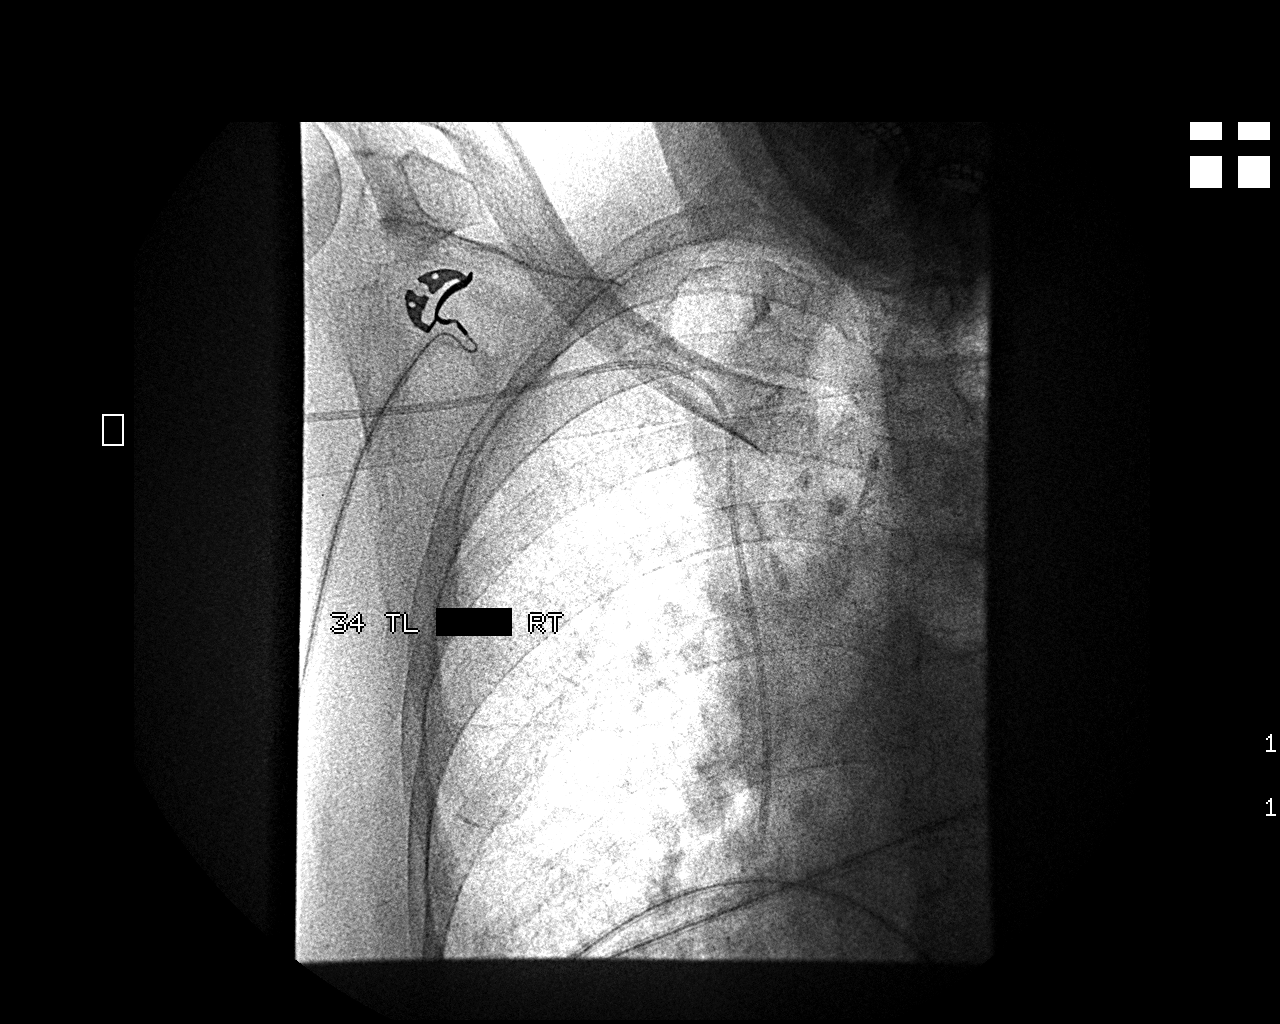

[2 of 2 positions shown; findings below may reference images not displayed]

Fluoroscopy Time: 2 minutes minutes.

Procedure:

The  arm was prepped with chlorhexidine, draped in the usual
sterile fashion using maximum barrier technique (cap and mask,
sterile gown, sterile gloves, large sterile sheet, hand hygiene and
cutaneous antiseptic).  Local anesthesia was attained by
infiltration with 1% lidocaine.

The existing PICC was cut and a 0.018-inch wire advanced into the
right atrium.  The PICC was removed over the wire.  A new 34 cm cm
5 French triple lumen power injectable PICC was advanced, and
positioned with its tip at the lower SVC/right atrial junction.
Fluoroscopy during the procedure and fluoro spot radiograph
confirms appropriate catheter position.  The catheter was flushed,
secured to the skin with Prolene sutures, and covered with a
sterile dressing.

Complications:  None.  The patient tolerated the procedure well.
IMPRESSION: Successful exchange of right arm PICC with sonographic and
fluoroscopic guidance.  The catheter is ready for use.

[REDACTED]
